# Patient Record
Sex: Female | Born: 1935
Health system: Southern US, Community
[De-identification: ages and names within clinical notes are randomized; demographics above are authoritative.]

## PROBLEM LIST (undated history)

## (undated) DIAGNOSIS — R002 Palpitations: Secondary | ICD-10-CM

## (undated) DIAGNOSIS — R42 Dizziness and giddiness: Secondary | ICD-10-CM

## (undated) DIAGNOSIS — M549 Dorsalgia, unspecified: Secondary | ICD-10-CM

## (undated) DIAGNOSIS — I10 Essential (primary) hypertension: Secondary | ICD-10-CM

## (undated) HISTORY — DX: Essential (primary) hypertension: I10

## (undated) HISTORY — PX: RETINAL LASER PROCEDURE: SHX2339

## (undated) HISTORY — DX: Palpitations: R00.2

## (undated) HISTORY — DX: Dorsalgia, unspecified: M54.9

## (undated) HISTORY — DX: Dizziness and giddiness: R42

---

## 2001-06-25 HISTORY — PX: COLONOSCOPY: SHX174

## 2006-07-23 ENCOUNTER — Emergency Department (HOSPITAL_COMMUNITY): Admission: EM | Admit: 2006-07-23 | Discharge: 2006-07-23 | Payer: Self-pay | Admitting: Emergency Medicine

## 2006-07-25 ENCOUNTER — Ambulatory Visit: Payer: Self-pay | Admitting: Cardiology

## 2006-07-26 DIAGNOSIS — I10 Essential (primary) hypertension: Secondary | ICD-10-CM | POA: Insufficient documentation

## 2006-08-06 ENCOUNTER — Encounter: Payer: Self-pay | Admitting: Cardiology

## 2006-08-06 ENCOUNTER — Ambulatory Visit: Payer: Self-pay | Admitting: Cardiology

## 2006-08-06 ENCOUNTER — Ambulatory Visit: Payer: Self-pay

## 2006-08-21 ENCOUNTER — Ambulatory Visit: Payer: Self-pay | Admitting: Internal Medicine

## 2006-08-22 ENCOUNTER — Ambulatory Visit: Payer: Self-pay | Admitting: Internal Medicine

## 2006-08-22 LAB — CONVERTED CEMR LAB
ALT: 22 units/L (ref 0–40)
Basophils Absolute: 0.3 10*3/uL — ABNORMAL HIGH (ref 0.0–0.1)
Cholesterol: 201 mg/dL (ref 0–200)
Direct LDL: 114.6 mg/dL
Eosinophils Absolute: 0.3 10*3/uL (ref 0.0–0.6)
HDL: 57.9 mg/dL (ref >39.0)
MCHC: 34.2 g/dL (ref 30.0–36.0)
MCV: 89.9 fL (ref 78.0–100.0)
Monocytes Relative: 6 % (ref 3.0–11.0)
Platelets: 212 10*3/uL (ref 150–400)
RBC: 4.71 M/uL (ref 3.87–5.11)
RDW: 12.9 % (ref 11.5–14.6)
TSH: 1.19 microintl units/mL (ref 0.35–5.50)
Total CHOL/HDL Ratio: 3.5
Triglycerides: 126 mg/dL (ref 0–149)

## 2006-08-28 ENCOUNTER — Ambulatory Visit: Payer: Self-pay | Admitting: Cardiology

## 2006-09-02 ENCOUNTER — Ambulatory Visit: Payer: Self-pay | Admitting: Internal Medicine

## 2006-09-02 LAB — CONVERTED CEMR LAB
Chloride: 107 meq/L (ref 96–112)
Creatinine, Ser: 0.6 mg/dL (ref 0.4–1.2)
GFR calc non Af Amer: 105 mL/min
Glucose, Bld: 114 mg/dL — ABNORMAL HIGH (ref 70–99)
Potassium: 3.7 meq/L (ref 3.5–5.1)
Sodium: 141 meq/L (ref 135–145)

## 2006-09-10 ENCOUNTER — Ambulatory Visit: Payer: Self-pay | Admitting: Internal Medicine

## 2007-02-25 ENCOUNTER — Ambulatory Visit: Payer: Self-pay | Admitting: Cardiology

## 2007-06-09 ENCOUNTER — Telehealth (INDEPENDENT_AMBULATORY_CARE_PROVIDER_SITE_OTHER): Payer: Self-pay | Admitting: *Deleted

## 2007-07-21 ENCOUNTER — Ambulatory Visit: Payer: Self-pay | Admitting: Cardiology

## 2007-08-11 ENCOUNTER — Ambulatory Visit: Payer: Self-pay | Admitting: Cardiology

## 2007-09-03 ENCOUNTER — Telehealth (INDEPENDENT_AMBULATORY_CARE_PROVIDER_SITE_OTHER): Payer: Self-pay | Admitting: *Deleted

## 2007-09-04 ENCOUNTER — Ambulatory Visit: Payer: Self-pay | Admitting: Cardiology

## 2007-09-16 ENCOUNTER — Ambulatory Visit: Payer: Self-pay | Admitting: Internal Medicine

## 2007-09-16 DIAGNOSIS — M549 Dorsalgia, unspecified: Secondary | ICD-10-CM | POA: Insufficient documentation

## 2007-10-14 ENCOUNTER — Ambulatory Visit: Payer: Self-pay | Admitting: Internal Medicine

## 2007-10-14 DIAGNOSIS — R002 Palpitations: Secondary | ICD-10-CM | POA: Insufficient documentation

## 2007-10-17 LAB — CONVERTED CEMR LAB
ALT: 20 units/L (ref 0–35)
CO2: 29 meq/L (ref 19–32)
Chloride: 107 meq/L (ref 96–112)
Cholesterol: 203 mg/dL (ref 0–200)
Creatinine, Ser: 0.7 mg/dL (ref 0.4–1.2)
Direct LDL: 116.3 mg/dL
GFR calc non Af Amer: 87 mL/min
Hemoglobin: 14.5 g/dL (ref 12.0–15.0)
Potassium: 4.5 meq/L (ref 3.5–5.1)
TSH: 0.91 microintl units/mL (ref 0.35–5.50)
Total CHOL/HDL Ratio: 3.3
VLDL: 23 mg/dL (ref 0–40)

## 2007-10-19 ENCOUNTER — Encounter (INDEPENDENT_AMBULATORY_CARE_PROVIDER_SITE_OTHER): Payer: Self-pay | Admitting: *Deleted

## 2007-11-25 ENCOUNTER — Ambulatory Visit: Payer: Self-pay | Admitting: Internal Medicine

## 2007-11-25 ENCOUNTER — Encounter (INDEPENDENT_AMBULATORY_CARE_PROVIDER_SITE_OTHER): Payer: Self-pay | Admitting: *Deleted

## 2008-02-13 ENCOUNTER — Ambulatory Visit: Payer: Self-pay | Admitting: Internal Medicine

## 2008-03-26 ENCOUNTER — Ambulatory Visit: Payer: Self-pay | Admitting: Internal Medicine

## 2008-03-26 DIAGNOSIS — H539 Unspecified visual disturbance: Secondary | ICD-10-CM

## 2008-06-25 HISTORY — PX: CATARACT EXTRACTION: SUR2

## 2008-08-10 ENCOUNTER — Encounter: Payer: Self-pay | Admitting: Cardiology

## 2008-08-16 ENCOUNTER — Ambulatory Visit: Payer: Self-pay | Admitting: Internal Medicine

## 2008-08-16 DIAGNOSIS — R42 Dizziness and giddiness: Secondary | ICD-10-CM

## 2008-08-23 ENCOUNTER — Telehealth (INDEPENDENT_AMBULATORY_CARE_PROVIDER_SITE_OTHER): Payer: Self-pay | Admitting: *Deleted

## 2008-08-26 ENCOUNTER — Ambulatory Visit: Payer: Self-pay | Admitting: Cardiology

## 2008-08-30 ENCOUNTER — Encounter: Payer: Self-pay | Admitting: Internal Medicine

## 2008-11-15 ENCOUNTER — Encounter (INDEPENDENT_AMBULATORY_CARE_PROVIDER_SITE_OTHER): Payer: Self-pay | Admitting: *Deleted

## 2008-11-15 ENCOUNTER — Ambulatory Visit: Payer: Self-pay | Admitting: Internal Medicine

## 2008-11-15 DIAGNOSIS — R1013 Epigastric pain: Secondary | ICD-10-CM | POA: Insufficient documentation

## 2008-11-16 ENCOUNTER — Ambulatory Visit: Payer: Self-pay | Admitting: Internal Medicine

## 2008-11-25 LAB — CONVERTED CEMR LAB
BUN: 9 mg/dL (ref 6–23)
CO2: 30 meq/L (ref 19–32)
Chloride: 113 meq/L — ABNORMAL HIGH (ref 96–112)
Glucose, Bld: 105 mg/dL — ABNORMAL HIGH (ref 70–99)
HDL: 59.6 mg/dL (ref >39.00)
LDL Cholesterol: 98 mg/dL (ref 0–99)
Potassium: 4.6 meq/L (ref 3.5–5.1)
Sodium: 144 meq/L (ref 135–145)
Total CHOL/HDL Ratio: 3
VLDL: 28.2 mg/dL (ref 0.0–40.0)
Vit D, 25-Hydroxy: 13 ng/mL — ABNORMAL LOW (ref 30–89)

## 2008-11-29 ENCOUNTER — Telehealth: Payer: Self-pay | Admitting: Internal Medicine

## 2008-11-30 ENCOUNTER — Ambulatory Visit: Payer: Self-pay | Admitting: Internal Medicine

## 2008-12-07 ENCOUNTER — Encounter (INDEPENDENT_AMBULATORY_CARE_PROVIDER_SITE_OTHER): Payer: Self-pay | Admitting: *Deleted

## 2008-12-07 LAB — CONVERTED CEMR LAB: Fecal Occult Bld: NEGATIVE

## 2008-12-13 ENCOUNTER — Encounter: Admission: RE | Admit: 2008-12-13 | Discharge: 2008-12-13 | Payer: Self-pay | Admitting: Internal Medicine

## 2008-12-31 ENCOUNTER — Telehealth: Payer: Self-pay | Admitting: Nurse Practitioner

## 2009-01-31 ENCOUNTER — Telehealth: Payer: Self-pay | Admitting: Cardiology

## 2009-03-24 ENCOUNTER — Ambulatory Visit: Payer: Self-pay | Admitting: Internal Medicine

## 2009-08-22 ENCOUNTER — Telehealth (INDEPENDENT_AMBULATORY_CARE_PROVIDER_SITE_OTHER): Payer: Self-pay | Admitting: *Deleted

## 2009-08-24 ENCOUNTER — Ambulatory Visit: Payer: Self-pay | Admitting: Internal Medicine

## 2009-08-29 ENCOUNTER — Telehealth: Payer: Self-pay | Admitting: Cardiology

## 2009-09-09 ENCOUNTER — Telehealth (INDEPENDENT_AMBULATORY_CARE_PROVIDER_SITE_OTHER): Payer: Self-pay | Admitting: *Deleted

## 2009-09-12 ENCOUNTER — Encounter (INDEPENDENT_AMBULATORY_CARE_PROVIDER_SITE_OTHER): Payer: Self-pay | Admitting: *Deleted

## 2009-09-15 ENCOUNTER — Ambulatory Visit: Payer: Self-pay | Admitting: Cardiology

## 2010-07-25 NOTE — Progress Notes (Signed)
  Phone Note Call from Patient   Caller: Patient Summary of Call: pt called has back pain, Medicaid will give me a brace if they can  get doctor approval they will fax papers.  Pt says she has been seein Dr Elmo Putt for this. Medicaid would not accept from Dr Elmo Putt office wants Dr Drue Novel to fill papers out.  Informed pt not seen since cpx in May, recommend ov for back pain,  OV scheduled .Kandice Hams  August 22, 2009 2:03 PM  Initial call taken by: Kandice Hams,  August 22, 2009 2:03 PM

## 2010-07-25 NOTE — Letter (Signed)
Summary: CMN for Back Support & Heat Therapy System/[facility]   CMN for Back Support & Heat Therapy System/[facility]   Imported By: Lanelle Bal 08/30/2009 12:16:18  _____________________________________________________________________  External Attachment:    Type:   Image     Comment:   External Document

## 2010-07-25 NOTE — Assessment & Plan Note (Signed)
Summary: back pain/alr   Vital Signs:  Patient profile:   77 year old female Height:      61.5 inches Weight:      187.6 pounds BMI:     35.00 Pulse rate:   70 / minute BP sitting:   142 / 80  Vitals Entered By: Shary Decamp (August 24, 2009 11:45 AM) CC: needs paperwork completed for a low back support   History of Present Illness: continue with chronic back pain,occ. pin gets  intense pt thinks  she will benefit from a back support and a heating pad system  currently she is not taking medication but she sees Dr. Christell Faith from time to time for an adjustment which helps  Current Medications (verified): 1)  Cartia Xt 240 Mg  Cp24 (Diltiazem Hcl Coated Beads) .Marland Kitchen.. 1po Qd 2)  Bayer Aspirin 325 Mg  Tabs (Aspirin) .Marland Kitchen.. 1 A  Day 3)  Vitamin C, D, Cal/mg, Fish Oil  Allergies (verified): No Known Drug Allergies  Past History:  Past Medical History: HYPERTENSION   PALPITATIONS, OCCASIONAL (ICD-785.1).. s/p Cards eval 07-2006, ECHO NL DIZZINESS   UNSPECIFIED VISUAL DISTURBANCE   BACK PAIN Cscope ([facility]) 2003: (-) Gyn: Dr Lloyd Huger  Past Surgical History: Reviewed history from 11/15/2008 and no changes required. laser, L retina 2009 R cataract surgery 08-2008  Social History: Reviewed history from 11/15/2008 and no changes required. widow moved to [place], family still in [place] 4 kids 8 grandkids from [place] Tobacco Use - No.  ETOH-- no exercise--active , [facility]   Physical Exam  General:  alert and well-developed.   Msk:   slightly tender to palpation in the lower back Extremities:  gait normal without distress   Impression & Recommendations:  Problem # 1:  BACK PAIN (ICD-724.5) chronic back pain, hopefully she will benefit from back support  and local heat.  Paperwork signed Her updated medication list for this problem includes:    Bayer Aspirin 325 Mg Tabs (Aspirin) .Marland Kitchen... 1 a  day  Complete Medication List: 1)  Cartia Xt 240 Mg Cp24  (Diltiazem hcl coated beads) .Marland Kitchen.. 1po qd 2)  Bayer Aspirin 325 Mg Tabs (Aspirin) .Marland Kitchen.. 1 a  day 3)  Vitamin C, D, Cal/mg, Fish Oil

## 2010-07-25 NOTE — Assessment & Plan Note (Signed)
Summary: F1Y/ GD  Medications Added BAYER ASPIRIN 325 MG  TABS (ASPIRIN) AS NEEDED      Allergies Added: ! * ASA.Marland KitchenNONCOATED  Visit Type:  1 yr f/u Primary Provider:  Nolon Rod. Paz MD  CC:  no cardiac complaints today.  History of Present Illness: Ms Vasey comes in today for followup and management of her hypertension.  Her blood pressure is running around 125 to 1:30 systolic with normal diastolics.  She's lost about 6 pounds. She looks remarkably good today.  She is having no chest pain, palpitations, shortness of breath, peripheral edema.  She needs her until her diltiazem today.  She is now living in [place]. Her son still lives here.  Current Medications (verified): 1)  Cartia Xt 240 Mg  Cp24 (Diltiazem Hcl Coated Beads) .Marland Kitchen.. 1po Qd 2)  Bayer Aspirin 325 Mg  Tabs (Aspirin) .... As Needed 3)  Vitamin D 1000 Unit Tabs (Cholecalciferol) .Marland Kitchen.. 1 Tab Once Daily 4)  Fish Oil 1000 Mg Caps (Omega-3 Fatty Acids) .Marland Kitchen.. 1 Cap Once Daily  Allergies (verified): 1)  ! * Asa..noncoated  Past History:  Past Medical History: Last updated: 08/24/2009 HYPERTENSION   PALPITATIONS, OCCASIONAL (ICD-785.1).. s/p Cards eval 07-2006, ECHO NL DIZZINESS   UNSPECIFIED VISUAL DISTURBANCE   BACK PAIN Cscope ([facility]) 2003: (-) Gyn: Dr Lloyd Huger  Past Surgical History: Last updated: 11/15/2008 laser, L retina 2009 R cataract surgery 08-2008  Family History: Last updated: 09/16/2007 DM - no MI - no breast Ca - no colon Ca - no  Social History: Last updated: 11/15/2008 widow moved to [place], family still in [place] 4 kids 8 grandkids from [place] Tobacco Use - No.  ETOH-- no exercise--active , goes to the "[facility]" daily   Risk Factors: Smoking Status: never (10/09/2008)  Review of Systems       negative other than history of present illness  Vital Signs:  Patient profile:   77 year old female Height:      61.5 inches Weight:      187 pounds BMI:     34.89 Pulse rate:    64 / minute Pulse rhythm:   irregular BP sitting:   144 / 70  (left arm) Cuff size:   large  Vitals Entered By: Danielle Rankin, CMA (September 15, 2009 2:24 PM)  Physical Exam  General:  obese.  she has lost significant weight Head:  normocephalic and atraumatic Eyes:  PERRLA/EOM intact; conjunctiva and lids normal. Neck:  Neck supple, no JVD. No masses, thyromegaly or abnormal cervical nodes. Chest Wonda Goodgame:  no deformities or breast masses noted Lungs:  Clear bilaterally to auscultation and percussion. Heart:  Non-displaced PMI, chest non-tender; regular rate and rhythm, S1, S2 without murmurs, rubs or gallops. Carotid upstroke normal, no bruit. Normal abdominal aortic size, no bruits. Femorals normal pulses, no bruits. Pedals normal pulses. No edema, no varicosities. Msk:  Back normal, normal gait. Muscle strength and tone normal. Pulses:  pulses normal in all 4 extremities Extremities:  No clubbing or cyanosis. Neurologic:  Alert and oriented x 3. Skin:  Intact without lesions or rashes. Psych:  Normal affect.   EKG  Procedure date:  09/15/2009  Findings:      sinus rhythm first degree AV block low voltage poor R-wave progression and answered recording, no change  Impression & Recommendations:  Problem # 1:  HYPERTENSION (ICD-401.9) Assessment Unchanged  Her updated medication list for this problem includes:    Cartia Xt 240 Mg Cp24 (Diltiazem hcl coated beads) .Marland KitchenMarland KitchenMarland KitchenMarland Kitchen  1po qd    Bayer Aspirin 325 Mg Tabs (Aspirin) .Marland Kitchen... As needed  Problem # 2:  PALPITATIONS, OCCASIONAL (ICD-785.1) Assessment: Improved  Her updated medication list for this problem includes:    Cartia Xt 240 Mg Cp24 (Diltiazem hcl coated beads) .Marland Kitchen... 1po qd    Bayer Aspirin 325 Mg Tabs (Aspirin) .Marland Kitchen... As needed  Orders: EKG w/ Interpretation (93000)  Patient Instructions: 1)  Your physician recommends that you schedule a follow-up appointment in: YEAR WITH DR Maxwel Meadowcroft 2)  Your physician recommends that you  continue on your current medications as directed. Please refer to the Current Medication list given to you today. Prescriptions: CARTIA XT 240 MG  CP24 (DILTIAZEM HCL COATED BEADS) 1po qd  #30 x 11   Entered by:   Scherrie Bateman, LPN   Authorized by:   Gaylord Shih, MD, [facility]   Signed by:   Scherrie Bateman, LPN on 04/54/0981   Method used:   Electronically to        [id number] (retail)       [address]       [place], [place]  [address]       Ph: [phone] or [phone]       Fax: [phone]   RxID:   [id number]

## 2010-07-25 NOTE — Miscellaneous (Signed)
Summary: med update  Clinical Lists Changes  Medications: Changed medication from * VITAMIN C, D, CAL/MG, FISH OIL to VITAMIN C 500 MG TABS (ASCORBIC ACID) 1 tab once daily Added new medication of VITAMIN D 1000 UNIT TABS (CHOLECALCIFEROL) 1 tab once daily Added new medication of FISH OIL 1000 MG CAPS (OMEGA-3 FATTY ACIDS) 1 cap once daily Added new medication of CALCIUM-MAGNESIUM 500-250 MG TABS (CALCIUM-MAGNESIUM) 1 tab once daily

## 2010-07-25 NOTE — Progress Notes (Signed)
  Phone Note Call from Patient Call back at Home Phone [phone] Call back at Work Phone [phone]   Summary of Call: Patient calling has an ulcer in her mouth x few days.  She is gargling with salt water.  Ulcer seems to be getting better.  Advised to continue gargling...watch for increased sxs or pain.  Offered ov -- pt states she is in [place].  UC if really bothersome. ov if no better in a few days Shary Decamp  September 09, 2009 9:39 AM

## 2010-07-25 NOTE — Progress Notes (Signed)
Summary: refill**New Pharmacy**   Phone Note Refill Request Message from:  Patient on August 29, 2009 10:19 AM  Refills Requested: Medication #1:  CARTIA XT 240 MG  CP24 1po qd   Supply Requested: 3 months [employer] [phone]   Method Requested: Telephone to Pharmacy Initial call taken by: Migdalia Dk,  August 29, 2009 10:22 AM  Follow-up for Phone Call        CMA s/w pharmacist and gave verbal order for rx. Danielle Rankin, CMA  August 29, 2009 2:25 PM

## 2010-10-12 ENCOUNTER — Encounter: Payer: Self-pay | Admitting: Cardiology

## 2010-10-13 ENCOUNTER — Encounter: Payer: Self-pay | Admitting: Cardiology

## 2010-10-13 ENCOUNTER — Ambulatory Visit (INDEPENDENT_AMBULATORY_CARE_PROVIDER_SITE_OTHER): Payer: Medicare Other | Admitting: Cardiology

## 2010-10-13 VITALS — BP 140/70 | HR 64 | Resp 18 | Ht 62.0 in | Wt 184.0 lb

## 2010-10-13 DIAGNOSIS — I519 Heart disease, unspecified: Secondary | ICD-10-CM | POA: Insufficient documentation

## 2010-10-13 DIAGNOSIS — I10 Essential (primary) hypertension: Secondary | ICD-10-CM

## 2010-10-13 MED ORDER — DILTIAZEM HCL ER COATED BEADS 240 MG PO CP24: 240.0000 mg | ORAL_CAPSULE | Freq: Every day | ORAL | Status: DC

## 2010-10-13 NOTE — Progress Notes (Signed)
Addended by: Lisabeth Devoid on: 10/13/2010 11:56 AM   Modules accepted: Orders

## 2010-10-13 NOTE — Progress Notes (Signed)
   Patient ID: Arrietty Dercole, female    DOB: 12-Oct-1932, 77 y.o.   MRN: [id number]  HPI  Mrs [facility]  Returns for E and M of her diastolic dysfunction and hypertension. She has been strict with her diet. Her BP has been as low as 110 routinely. No symptoms of hypoperfusion. She also has had vey little edema.  EKG shows NSR with first degree AVB.   Review of Systems  All other systems reviewed and are negative.      Physical Exam  Constitutional: She is oriented to person, place, and time. She appears well-developed and well-nourished. No distress.  HENT:  Head: Normocephalic and atraumatic.  Eyes: EOM are normal. Pupils are equal, round, and reactive to light.  Neck: Normal range of motion. Neck supple. No JVD present. No tracheal deviation present. Thyromegaly present.       No bruits.  Cardiovascular: Normal rate, regular rhythm, normal heart sounds and intact distal pulses.  Exam reveals no gallop.   No murmur heard. Pulmonary/Chest: Effort normal and breath sounds normal.  Abdominal: Soft. Bowel sounds are normal. She exhibits no distension.  Musculoskeletal: Normal range of motion.  Neurological: She is alert and oriented to person, place, and time.  Skin: Skin is warm and dry.  Psychiatric: She has a normal mood and affect.

## 2010-10-13 NOTE — Patient Instructions (Signed)
Your physician recommends that you schedule a follow-up appointment in: 1 year with Dr. Wall  

## 2010-11-07 NOTE — Assessment & Plan Note (Signed)
[facility]                            CARDIOLOGY OFFICE NOTE   VALBONA, SLABACH                    MRN:          [id number]  DATE:09/04/2007                            DOB:          Jul 07, 1932    [name] comes today for careful follow-up her for history of  hypertension and palpitations.   She cannot take Diltiazem extended release at 360 mg.  She says she just  had too much constipation.   We decreased her diltiazem to 240  mg a day and asked her to check her  blood pressure on a regular basis.  She comes in with a whole list of  blood pressures today which are 120-130 systolic and diastolic less than  80.  She feels better with less constipation.   She denies any chest pain, palpitations, shortness of breath.  She has  had no orthopnea, PND or peripheral edema.   PHYSICAL EXAMINATION:  Her blood pressure was actually high as always in  our office at 148/86, her pulse 75 and regular.  Her weight is 194. The  rest of her exam is unchanged.  LUNGS:  Clear.  HEART:  Reveals a regular rate and rhythm.  No extrasystoles.  ABDOMEN:  Soft.  There is no JVD.  EXTREMITIES:  Only trace edema.  She has chubby legs she says. Pulses  are intact.  NEUROLOGIC:  Intact.   [name] obviously has her blood pressure under good control.  We  will stay with a lower dose of 240 mg of diltiazem a day.  I will plan  on seeing her back in a year.     Thomas C. Daleen Squibb, MD, [facility]  Electronically Signed    TCW/MedQ  DD: 09/04/2007  DT: 09/05/2007  Job #: [id number]   cc:   Pollyann Glen, MD

## 2010-11-07 NOTE — Assessment & Plan Note (Signed)
[facility]                            CARDIOLOGY OFFICE NOTE   Marie Hebert, Marie Hebert                    MRN:          [id number]  DATE:02/25/2007                            DOB:          05/24/1933    [name] returns today for further management of her hypertension  and nonspecific tachycardia.   Her blood pressure has been running very well at home in the 130s on her  cuff.  She is on diltiazem 240 mg a day.  She also takes aspirin 325 mg  a day.   Previous 2 D echocardiogram showed normal left ventricular function with  no LVH.  This was obtained on August 06, 2006.   She has had some blood work obtained in February and March that I have a  copy of.  Her LDL runs only 114.6.  TSH was normal.  Total cholesterol  was 201, triglycerides 126, of HDL 57.9.  Her hemoglobin, CBC,  electrolytes, creatinine were normal.   Her only cardiac medication is diltiazem 240 mg a day.   Her blood pressure today was elevated as it always is in the office at  146/92.  We repeated it and it was 148/88.  Heart rate 74 and regular.  EKG was normal except for low voltage.  Weight is 194, down 7.  HEENT:  Unchanged.  Carotids are equal bilaterally without bruits.  There is no JVD.  Thyroid is not enlarged.  Trachea is midline.  LUNGS:  Clear.  HEART:  Nondisplaced PMI.  She has normal S1-S2.  ABDOMEN:  Soft.  EXTREMITIES:  No edema.  Pulses are intact.  NEUROLOGIC:  Intact.   ASSESSMENT AND PLAN:  [name] is doing well.  We will make no  changes in her program today.  I will plan on seeing her back in a year.     Thomas C. Daleen Squibb, MD, [facility]  Electronically Signed    TCW/MedQ  DD: 02/25/2007  DT: 02/26/2007  Job #: [id number]

## 2010-11-07 NOTE — Assessment & Plan Note (Signed)
[facility]                            CARDIOLOGY OFFICE NOTE   YANEISY, WENZ                    MRN:          [id number]  DATE:08/26/2008                            DOB:          Mar 01, 1933    Ms. Crevier comes in today for followup of her hypertension and lower  extremity edema.  She has been doing remarkably well.  Her weight is  down 2 pounds to 192.  Her blood pressures have been good outside the  office.   MEDICATIONS:  She is currently on;  1. Diltiazem 240 mg a day.  2. Aspirin 325 mg a day.  3. Fish oil.   PHYSICAL EXAMINATION:  VITAL SIGNS:  Her blood pressure today is 129/71,  her pulse is 72 and regular, and her weight is 192.  GENERAL:  She is in no acute distress.  Pleasant as always, alert and  oriented x3.  SKIN:  Warm and dry.  HEENT:  Other than wearing glasses is normal.  NECK:  Supple.  Carotid upstrokes are equal bilaterally without bruits,  no JVD.  Thyroid is not enlarged.  Trachea is midline.  CHEST:  Lungs  clear to auscultation and percussion.  HEART:  A normal S1 and S2, nondisplaced PMI.  ABDOMEN:  Soft, good bowel sounds.  No midline bruit.  No hepatomegaly.  EXTREMITIES:  No cyanosis, clubbing, or edema.  Pulses are intact in all  4 extremities.  NEURO:  Intact.  Affect is normal.   Electrocardiogram shows sinus rhythm, low voltage, no changes.   ASSESSMENT/PLAN:  Ms. Alesi is doing well.  I have made no change in  her medical program.  We will see her back in a year.      Thomas C. Daleen Squibb, MD, [facility]  Electronically Signed    TCW/MedQ  DD: 08/26/2008  DT: 08/26/2008  Job #: [id number]

## 2010-11-07 NOTE — Assessment & Plan Note (Signed)
[facility]                            CARDIOLOGY OFFICE NOTE   ALINDA, EGOLF                    MRN:          [id number]  DATE:08/11/2007                            DOB:          [birth year]    Ms. Gundlach returns today for further manage of her palpitations and  hypertension.  Blood pressure on last visit was 182/92.   We increased her diltiazem from 240 to 360.  She says she cannot take  this because of constipation.   Her blood pressures at home have been running in the 120s but she can  recall one occasion she checked it.  She was having no palpitations.   PHYSICAL EXAMINATION:  VITAL SIGNS:  Her blood pressure today was  146/82, pulse 63 and regular.  Weight is 195.  HEENT:  Unchanged.  NECK:  Carotids are full without bruits.  There is no JVD.  LUNGS:  Clear.  HEART:  A normal S1 and S2.  ABDOMEN:  Soft, good bowel sounds.  EXTREMITIES:  Trace edema.  Pulses are intact.  NEUROLOGIC:  Exam is intact.   EKG shows sinus rhythm, first-degree AV block of 216 milliseconds which  is slightly increased, low QRS voltage which is unchanged.   Medical office is convinced that she cannot take 360 mg diltiazem.  Will  cut her back to 240 and have her check blood pressures on a regular  basis.  I will have her come back in 2-3 weeks to verify this.  I have  told her there is a good chance she may need a second drug which she is  very adverse to.     Thomas C. Daleen Squibb, MD, [facility]  Electronically Signed    TCW/MedQ  DD: 08/11/2007  DT: 08/12/2007  Job #: [id number]

## 2010-11-07 NOTE — Assessment & Plan Note (Signed)
[facility]                            CARDIOLOGY OFFICE NOTE   Marie Hebert, Marie Hebert                    MRN:          [id number]  DATE:07/21/2007                            DOB:          [birth year]    [name] returns today for further management of her hypertension  and nonspecific tachycardia.  Her blood pressure has been up and down  according to her history.  She has really shied away from medications  and we have tried to treat both her history of nonspecific tachycardia  and hypertension with low dose or mid range Diltiazem.   She is has a history of normal left ventricular function with no LVH by  2-D echo August 06, 2006.   Her only cardiac medication is Diltiazem extended release 240 mg a day.   She denies orthopnea, PND or peripheral edema.  She has had no  headaches, visual changes or neurological symptoms.   Her blood pressure today by the machine is 164/83, pulse is 69 and  regular; manual is 182/92.  Her heart rate 69 and regular.  HEENT:  Normocephalic, atraumatic.  PERRLA.  Extraocular movements are  intact.  Sclera clear.  Face symmetry normal.  Carotid upstrokes are  equal bilaterally without bruits.  There is no JVD.  Thyroid is not  enlarged.  Trachea is midline.  LUNGS:  Clear.  HEART:  Reveals a nondisplaced PMI.  She has normal S1-S2 without S4.  ABDOMINAL:  Soft, good bowel sounds.  She is a moderately overweight,  difficult to assess organomegaly.  EXTREMITIES:  Reveal no edema.  Pulses are intact.  NEURO:  Exam is intact.   ASSESSMENT/PLAN:  I have explained to Marie Hebert she may need an  additional medication to control her blood pressure.  She is very  opposed to this.  She is afraid of a stroke, however.   PLAN:  1. Increase Diltiazem extended release to 360 mg a day.  2. See me back in 2 weeks to assess blood pressure as well as any      peripheral edema.  We may need an additional medication.  Goal      blood pressure is less than 140/85 or less.     Thomas C. Daleen Squibb, MD, [facility]  Electronically Signed    TCW/MedQ  DD: 07/21/2007  DT: 07/21/2007  Job #: [id number]

## 2010-11-10 NOTE — Assessment & Plan Note (Signed)
[facility]                            CARDIOLOGY OFFICE NOTE   RISE, TRAEGER                    MRN:          [id number]  DATE:07/25/2006                            DOB:          [birth year]    CARDIOLOGY CONSULTATION NOTE   CHIEF COMPLAINT:  I had a fast heartbeat.   HISTORY OF PRESENT ILLNESS:  Marie Hebert is a delightful 77 year old  widowed [place] lady who was born and raised in [place].  She is  fascinating to talk to and I thoroughly enjoyed her visit.   She was getting ready to go to bed on the evening of July 22, 2006.  She suddenly had a fast pounding in her chest that was irregular.  She  checked her pulse and it was about 120.  She came to the emergency room  by EMS.  Apparently, they placed an IV, but did not start any  medication.  By the time she got to the emergency room she was given 20  mg of IV Cardizem.  At the time she arrived at the emergency room she  was in sinus tachycardia per the ER note.  An EKG that I have looks like  sinus tac, though the P waves are a little bit different from an EKG  subsequently done on January 29.  It does not look fast enough to be  SVT, and it is fairly regular.  It may be, possibly atrial fibrillation.   She has had no recurrence since.  Her blood work in the emergency room  was unremarkable, including a potassium as well as cardiac enzymes and a  CBC.   PAST MEDICAL HISTORY:  She has no previous medical problems.  She did  have some palpitations a couple years ago in [place] and saw a  cardiologist.  Apparently, she had a nuclear stress study, which was  normal.  She takes no medications.  She has no dye reaction.  She has  difficulty with ASPIRIN upsetting her stomach.  She has taken 81 mg of  aspirin in the past.   CURRENT MEDICATIONS:  Vitamins.  Chromium.  Fish oil.  Fiber.  A herb  vitamin.   She does not smoke.  She does not drink.  She does not drink any  caffeine.  She does have a history of elevated cholesterol back to 2005.  She does not list the values.  She does walk on a regular basis.   She has had no previous surgeries.   FAMILY HISTORY:  Unremarkable.   SOCIAL HISTORY:  She is disabled from a back problem.  She is widowed  and has 4 children.  She used to use in the [place], [place] area  before she moved here.   REVIEW OF SYSTEMS:  All assessed and are negative.  She specifically  denies any orthopnea, PND, peripheral edema, chest discomfort, melena,  hematochezia.   EXAM:  Height 5 feet 2 inches, weight 202, blood pressure today 156/87,  pulse 80 and regular.  She is extremely pleasant.  She is alert and oriented x3.  SKIN:  Warm and dry.  She is wearing glasses.  HEENT:  Normocephalic, atraumatic.  PERRLA.  Extraocular movements are  intact.  Sclerae clear.  Facial symmetry was normal.  Dentition is  remarkable for a bridge in the upper left part of her mouth.  Carotids are full without bruits.  There is no JVD.  Thyroid is not  enlarged.  LUNGS:  Clear.  HEART:  Regular rate and rhythm without murmur, rub, or gallop.  ABDOMEN:  Soft with good bowel sounds.  EXTREMITIES:  Trace edema.  Pulses were brisk.  NEURO:  Intact.  MUSCULOSKELETAL:  Chronic arthritic changes.  SKIN:  Unremarkable.   ASSESSMENT AND PLAN:  I have had a long talk with Mrs. Siska.  I  think she had some sort of supraventricular tachycardia, but from the  EKGs I have it looks either like sinus tachycardia, ectopic atrial  tachycardia, or it could be even regular atrial fibrillation.   PLAN:  1. A 2D echocardiogram.  2. Begin aspirin 325 a day, coated, with food.  3. See me back on the same day of her echo to discuss the findings.      We will try to keep this treatment as conservative as possible.   I also want to check her blood pressure returns.     Thomas C. Daleen Squibb, MD, [facility]  Electronically Signed    TCW/MedQ  DD: 07/25/2006  DT:  07/25/2006  Job #: [id number]   cc:   Dr. Lahoma Crocker, MD

## 2010-11-10 NOTE — Assessment & Plan Note (Signed)
[facility]                            CARDIOLOGY OFFICE NOTE   Marie Hebert, Marie Hebert                    MRN:          [id number]  DATE:08/06/2006                            DOB:          [birth year]    The patient returns today to discuss the findings of her 2D  echocardiogram.  This was ordered because of a nonspecific tachycardia  and elevated blood pressure.  Please see my note please from July 25, 2006.   Her echo shows normal left ventricular chamber size and function.  There  was no obvious LVH.  Her EF was 50% to 55%.  Valve function was normal.  She had mild mitral valve regurgitation.  There was a very small  pericardial effusion.  There was mild left atrial dilatation.   Her blood pressure is elevated again today at 150/92 manually, 172/94  with a machine.  Her pulse is 76 and regular.  Weight is unchanged.  The  rest of the exam is unchanged.   I spent about 20 minutes talking to the patient about her hypertension  and potential strain it is putting on her heart and blood vessels.  It  may be partly responsible for the tachycardia she has had.  I have asked  her to continue aspirin 325 mg a day, and to begin diltiazem extended  release 240 mg a day.  We will plan on seeing her back in 3 weeks.  She  will check blood pressures, make recordings, and bring her machine to  calibrate here in the office at that time.     Thomas C. Daleen Squibb, MD, [facility]  Electronically Signed    TCW/MedQ  DD: 08/06/2006  DT: 08/06/2006  Job #: [id number]   cc:   Dr. Riley Nearing

## 2010-11-10 NOTE — Assessment & Plan Note (Signed)
[facility]                            CARDIOLOGY OFFICE NOTE   Marie Hebert, Marie Hebert                    MRN:          [id number]  DATE:08/28/2006                            DOB:          [birth year]    REFERRING PHYSICIAN:  Jesse Sans. Wall, MD, [facility]   The patient comes in today for further management of hypertension and  nonspecific tachycardia.  Please see my note from August 06, 2006.   Her blood pressures are running in the mid-130s at home, at rest in the  mornings.  She has had some headache from the Diltiazem, but it is  getting better.  She is on 240 mg a day.  She is also taking aspirin 325  a day.   PHYSICAL EXAMINATION:  VITAL SIGNS:  Her blood pressure in the office  continues to be in the 150s with a diastolic above 90.  This is not  consistent with her readings at home.  Her pulse is 76 and regular.  Weight is 201.  GENERAL:  She is no acute distress.  RESPIRATORY:  Exam is unchanged.   I have asked her to continue to take pressures on a regular basis.  If  she starts to run in the 140's to 150s at home, she will call us.  Otherwise, will continue with the Diltiazem 240.  Please note:  She had  no LVH on her echocardiogram.   I will see her back in 6 months.     Thomas C. Daleen Squibb, MD, [facility]  Electronically Signed    TCW/MedQ  DD: 08/28/2006  DT: 08/28/2006  Job #: [id number]   cc:   Dr. Riley Nearing

## 2011-06-28 DIAGNOSIS — IMO0002 Reserved for concepts with insufficient information to code with codable children: Secondary | ICD-10-CM | POA: Diagnosis not present

## 2011-06-28 DIAGNOSIS — M67919 Unspecified disorder of synovium and tendon, unspecified shoulder: Secondary | ICD-10-CM | POA: Diagnosis not present

## 2011-06-28 DIAGNOSIS — R269 Unspecified abnormalities of gait and mobility: Secondary | ICD-10-CM | POA: Diagnosis not present

## 2011-06-28 DIAGNOSIS — M719 Bursopathy, unspecified: Secondary | ICD-10-CM | POA: Diagnosis not present

## 2011-07-02 DIAGNOSIS — M719 Bursopathy, unspecified: Secondary | ICD-10-CM | POA: Diagnosis not present

## 2011-07-02 DIAGNOSIS — IMO0002 Reserved for concepts with insufficient information to code with codable children: Secondary | ICD-10-CM | POA: Diagnosis not present

## 2011-07-02 DIAGNOSIS — R269 Unspecified abnormalities of gait and mobility: Secondary | ICD-10-CM | POA: Diagnosis not present

## 2011-07-02 DIAGNOSIS — M67919 Unspecified disorder of synovium and tendon, unspecified shoulder: Secondary | ICD-10-CM | POA: Diagnosis not present

## 2011-07-04 DIAGNOSIS — M67919 Unspecified disorder of synovium and tendon, unspecified shoulder: Secondary | ICD-10-CM | POA: Diagnosis not present

## 2011-07-04 DIAGNOSIS — R269 Unspecified abnormalities of gait and mobility: Secondary | ICD-10-CM | POA: Diagnosis not present

## 2011-07-04 DIAGNOSIS — IMO0002 Reserved for concepts with insufficient information to code with codable children: Secondary | ICD-10-CM | POA: Diagnosis not present

## 2011-07-05 DIAGNOSIS — M719 Bursopathy, unspecified: Secondary | ICD-10-CM | POA: Diagnosis not present

## 2011-07-05 DIAGNOSIS — R269 Unspecified abnormalities of gait and mobility: Secondary | ICD-10-CM | POA: Diagnosis not present

## 2011-07-05 DIAGNOSIS — M67919 Unspecified disorder of synovium and tendon, unspecified shoulder: Secondary | ICD-10-CM | POA: Diagnosis not present

## 2011-07-05 DIAGNOSIS — IMO0002 Reserved for concepts with insufficient information to code with codable children: Secondary | ICD-10-CM | POA: Diagnosis not present

## 2011-07-11 DIAGNOSIS — IMO0002 Reserved for concepts with insufficient information to code with codable children: Secondary | ICD-10-CM | POA: Diagnosis not present

## 2011-07-11 DIAGNOSIS — M67919 Unspecified disorder of synovium and tendon, unspecified shoulder: Secondary | ICD-10-CM | POA: Diagnosis not present

## 2011-07-11 DIAGNOSIS — R269 Unspecified abnormalities of gait and mobility: Secondary | ICD-10-CM | POA: Diagnosis not present

## 2011-07-16 DIAGNOSIS — IMO0002 Reserved for concepts with insufficient information to code with codable children: Secondary | ICD-10-CM | POA: Diagnosis not present

## 2011-07-16 DIAGNOSIS — R269 Unspecified abnormalities of gait and mobility: Secondary | ICD-10-CM | POA: Diagnosis not present

## 2011-07-16 DIAGNOSIS — M67919 Unspecified disorder of synovium and tendon, unspecified shoulder: Secondary | ICD-10-CM | POA: Diagnosis not present

## 2011-07-18 DIAGNOSIS — IMO0002 Reserved for concepts with insufficient information to code with codable children: Secondary | ICD-10-CM | POA: Diagnosis not present

## 2011-07-18 DIAGNOSIS — M67919 Unspecified disorder of synovium and tendon, unspecified shoulder: Secondary | ICD-10-CM | POA: Diagnosis not present

## 2011-07-18 DIAGNOSIS — R269 Unspecified abnormalities of gait and mobility: Secondary | ICD-10-CM | POA: Diagnosis not present

## 2011-07-18 DIAGNOSIS — M719 Bursopathy, unspecified: Secondary | ICD-10-CM | POA: Diagnosis not present

## 2011-07-19 DIAGNOSIS — R269 Unspecified abnormalities of gait and mobility: Secondary | ICD-10-CM | POA: Diagnosis not present

## 2011-07-19 DIAGNOSIS — IMO0002 Reserved for concepts with insufficient information to code with codable children: Secondary | ICD-10-CM | POA: Diagnosis not present

## 2011-07-19 DIAGNOSIS — M67919 Unspecified disorder of synovium and tendon, unspecified shoulder: Secondary | ICD-10-CM | POA: Diagnosis not present

## 2011-07-23 DIAGNOSIS — R269 Unspecified abnormalities of gait and mobility: Secondary | ICD-10-CM | POA: Diagnosis not present

## 2011-07-23 DIAGNOSIS — M67919 Unspecified disorder of synovium and tendon, unspecified shoulder: Secondary | ICD-10-CM | POA: Diagnosis not present

## 2011-07-23 DIAGNOSIS — IMO0002 Reserved for concepts with insufficient information to code with codable children: Secondary | ICD-10-CM | POA: Diagnosis not present

## 2011-09-04 ENCOUNTER — Encounter: Payer: Medicare Other | Admitting: Internal Medicine

## 2011-10-04 DIAGNOSIS — H2510 Age-related nuclear cataract, unspecified eye: Secondary | ICD-10-CM | POA: Diagnosis not present

## 2011-10-09 ENCOUNTER — Telehealth: Payer: Self-pay | Admitting: Internal Medicine

## 2011-10-09 NOTE — Telephone Encounter (Signed)
Please advise 

## 2011-10-09 NOTE — Telephone Encounter (Signed)
Schedule with me at her convenience

## 2011-10-09 NOTE — Telephone Encounter (Signed)
Patient cancelled 3.18 appointment but would like to reschedule to re-establish here with [facility]. Would you agree to take her back on or should I refer her to another Provider in the practice? Please review and advise and I will call patient back to schedule Patient ph#  [phone]

## 2011-10-09 NOTE — Telephone Encounter (Signed)
Made CPE for 12/31/2011 830am, coming in tomorrow for Bladder infection advised patient she had a 15 minute visit tomorrow & a 30-minute fasting physical in July. Patient expressed full understanding.

## 2011-10-10 ENCOUNTER — Ambulatory Visit (INDEPENDENT_AMBULATORY_CARE_PROVIDER_SITE_OTHER): Payer: Medicare Other | Admitting: Internal Medicine

## 2011-10-10 ENCOUNTER — Encounter: Payer: Self-pay | Admitting: Internal Medicine

## 2011-10-10 VITALS — BP 136/78 | HR 66 | Temp 97.9°F | Wt 187.0 lb

## 2011-10-10 DIAGNOSIS — N390 Urinary tract infection, site not specified: Secondary | ICD-10-CM | POA: Diagnosis not present

## 2011-10-10 LAB — POCT URINALYSIS DIPSTICK
Glucose, UA: NEGATIVE
Spec Grav, UA: 1.01
Urobilinogen, UA: 0.2
pH, UA: 8.5

## 2011-10-10 MED ORDER — CIPROFLOXACIN HCL 250 MG PO TABS: 250.0000 mg | ORAL_TABLET | Freq: Two times a day (BID) | ORAL | Status: AC

## 2011-10-10 NOTE — Assessment & Plan Note (Signed)
Symptoms and Udip consistent with UTI. See instructions. Urine culture pending

## 2011-10-10 NOTE — Patient Instructions (Signed)
Take antibiotics as prescribed, drink plenty of fluids, call us if you are not improving in the next few days. Call any time if you get worse.

## 2011-10-10 NOTE — Progress Notes (Signed)
  Subjective:    Patient ID: Marie Hebert, female    DOB: October 13, 1932, 78 y.o.   MRN: [id number]  HPI Acute visit She was living in [place], [place], moved back a few months ago. Complaining of  Dysuria for 3 days followed by gross hematuria yesterday. She is having mild discomfort at the left flank. The discomforts on-and-off.   Past Medical History: HTN   PALPITATIONS, OCCASIONAL (ICD-785.1).. s/p Cards eval 07-2006, ECHO NL DIZZINESS   BACK PAIN Cscope ([facility]) 2003: (-) Meniscal tear 9-12 Gyn: Dr Lloyd Huger  Past Surgical History: laser, L retina 2009 R cataract surgery 08-2008  Social History: Widow, moved back to [place] from [place] 06-2011 4 kids 8 grandkids from [place] Tobacco Use - No.  ETOH-- no   Review of Systems No fever or chills No nausea, vomiting, diarrhea.     Objective:   Physical Exam  General -- alert, well-developed, and well-nourished. NAD  Lungs -- normal respiratory effort, no intercostal retractions, no accessory muscle use, and normal breath sounds.   Heart-- normal rate, regular rhythm, no murmur, and no gallop.   Abdomen--soft, non-tender, no distention, no masses, no HSM, no guarding, and no rigidity.  No CVA tenderness  Extremities-- no pretibial edema bilaterally      Assessment & Plan:

## 2011-10-12 LAB — URINE CULTURE: Colony Count: >=100000

## 2011-10-24 ENCOUNTER — Ambulatory Visit: Payer: Medicare Other | Admitting: Cardiology

## 2011-12-03 DIAGNOSIS — M999 Biomechanical lesion, unspecified: Secondary | ICD-10-CM | POA: Diagnosis not present

## 2011-12-03 DIAGNOSIS — M5137 Other intervertebral disc degeneration, lumbosacral region: Secondary | ICD-10-CM | POA: Diagnosis not present

## 2011-12-17 DIAGNOSIS — M5137 Other intervertebral disc degeneration, lumbosacral region: Secondary | ICD-10-CM | POA: Diagnosis not present

## 2011-12-17 DIAGNOSIS — M999 Biomechanical lesion, unspecified: Secondary | ICD-10-CM | POA: Diagnosis not present

## 2011-12-31 ENCOUNTER — Ambulatory Visit (INDEPENDENT_AMBULATORY_CARE_PROVIDER_SITE_OTHER): Payer: Medicare Other | Admitting: Internal Medicine

## 2011-12-31 ENCOUNTER — Encounter: Payer: Self-pay | Admitting: Internal Medicine

## 2011-12-31 VITALS — BP 144/82 | HR 65 | Temp 98.0°F | Ht 62.0 in | Wt 184.0 lb

## 2011-12-31 DIAGNOSIS — E559 Vitamin D deficiency, unspecified: Secondary | ICD-10-CM | POA: Diagnosis not present

## 2011-12-31 DIAGNOSIS — M1990 Unspecified osteoarthritis, unspecified site: Secondary | ICD-10-CM | POA: Diagnosis not present

## 2011-12-31 DIAGNOSIS — Z0000 Encounter for general adult medical examination without abnormal findings: Secondary | ICD-10-CM | POA: Diagnosis not present

## 2011-12-31 DIAGNOSIS — I10 Essential (primary) hypertension: Secondary | ICD-10-CM

## 2011-12-31 LAB — CBC WITH DIFFERENTIAL/PLATELET
Basophils Relative: 1.7 % (ref 0.0–3.0)
Eosinophils Relative: 4.9 % (ref 0.0–5.0)
HCT: 43.5 % (ref 36.0–46.0)
Lymphs Abs: 2 10*3/uL (ref 0.7–4.0)
MCHC: 33.5 g/dL (ref 30.0–36.0)
MCV: 91.1 fl (ref 78.0–100.0)
Monocytes Absolute: 0.4 10*3/uL (ref 0.1–1.0)
Platelets: 191 10*3/uL (ref 150.0–400.0)
WBC: 5.5 10*3/uL (ref 4.5–10.5)

## 2011-12-31 LAB — BASIC METABOLIC PANEL
BUN: 13 mg/dL (ref 6–23)
CO2: 22 mEq/L (ref 19–32)
Chloride: 109 mEq/L (ref 96–112)
Potassium: 3.9 mEq/L (ref 3.5–5.1)

## 2011-12-31 LAB — LDL CHOLESTEROL, DIRECT: Direct LDL: 115.1 mg/dL

## 2011-12-31 LAB — LIPID PANEL: Total CHOL/HDL Ratio: 3

## 2011-12-31 MED ORDER — DILTIAZEM HCL ER COATED BEADS 240 MG PO CP24: 240.0000 mg | ORAL_CAPSULE | Freq: Every day | ORAL | Status: DC

## 2011-12-31 NOTE — Progress Notes (Signed)
  Subjective:    Patient ID: Marie Hebert, female    DOB: 31-May-1933, 78 y.o.   MRN: [id number]  HPI Here for Medicare AWV: 1. Risk factors based on Past M, S, F history: reviewed 2. Physical Activities:  Limited by L>R knee pain, L knee worse since a fall last year 3. Depression/mood:  No problemss noted or reported  4. Hearing:  No problemss noted or reported  5. ADL's:  Independent, still drives  6. Fall Risk: fell last year, injured knee , prevention discussed  7. home Safety: does feelsafe at home  8. Height, weight, &visual acuity: see VS, uses reading glasses  9. Counseling: provided 10. Labs ordered based on risk factors: if needed  11. Referral Coordination: if needed 12.  Care Plan, see assessment and plan  13.   Cognitive Assessment: Cognition within normal. Motor skills limited by knee pain  In addition, today we discussed the following: History of palpitations, currently asymptomatic Hypertension, good medication compliance with Cardizem. Ambulatory BPs checked frequently and they're normal. History of vitamin D deficiency, on daily calcium and vitamin D.   Past Medical History: HTN    Palpitations.. s/p Cards eval 07-2006, ECHO NL Vitamin D deficiency   Back pain on-off DJD Meniscal tear 9-12   Past Surgical History: laser, L retina 2009 R cataract surgery 08-2008  Social History: Widow, moved back to [place] from [place] 06-2011, lives by herself 4 kids, 8 grandkids from [place] Tobacco Use - No.   ETOH-- no Diet-- healthy  Family History: DM - no MI - no breast Ca - no colon Ca - no  Review of Systems No chest pain or shortness of breath No nausea, vomiting, diarrhea. No blood in the stools. No dysuria or gross hematuria. No vaginal discharge or bleeding.    Objective:   Physical Exam  General -- alert, well-developed  Neck --no thyromegaly , normal carotid pulse, no LADs Lungs -- normal respiratory effort, no intercostal retractions, no  accessory muscle use, and normal breath sounds.   Heart-- normal rate, regular rhythm, no murmur, and no gallop.   Abdomen--soft, non-tender, no distention, no masses, no HSM, no guarding, and no rigidity. No bruit  Extremities-- no pretibial edema bilaterally Neurologic-- alert & oriented X3 and strength normal in all extremities. Psych-- Cognition and judgment appear intact. Alert and cooperative with normal attention span and concentration.  not anxious appearing and not depressed appearing.      Assessment & Plan:

## 2011-12-31 NOTE — Patient Instructions (Addendum)
Check the  blood pressure 2 or 3 times a week, be sure it is between 110/60 and 140/85. If it is consistently higher or lower, let me know  

## 2011-12-31 NOTE — Assessment & Plan Note (Addendum)
Bilateral knee pain, L>R; left knee pain exacerbated by a fall last year, saw a specialist at [facility], was told she had a meniscal problem. Declined  further evaluation---> will call if interested

## 2011-12-31 NOTE — Assessment & Plan Note (Signed)
Well-controlled, continue with Cardizem

## 2011-12-31 NOTE — Assessment & Plan Note (Signed)
Low vitamin D in 2010, status post replacement. . She does take reportedly, calcium and vitamin D daily Plan-- labs

## 2011-12-31 NOTE — Assessment & Plan Note (Signed)
Td 2009 pneumonia shot-- declined  shingles shot-- benefits discussed, declined   last PAP @ Dr Lloyd Huger, patient states it was very difficult (vag atresia?) , no h/o previous abnormal PAPs-- declines further screening  Normal MMG 2010, no FH, declines further evaluation DEXA-- had one in the 2000s,   explained benefits of get one again; declined  reportedly Cscope 2003 (-),  neg hemocults 2009, (-) iFOB 2010. Declined further screening

## 2012-01-01 LAB — VITAMIN D 25 HYDROXY (VIT D DEFICIENCY, FRACTURES): Vit D, 25-Hydroxy: 34 ng/mL (ref 30–89)

## 2012-01-02 ENCOUNTER — Encounter: Payer: Self-pay | Admitting: *Deleted

## 2012-04-01 ENCOUNTER — Encounter: Payer: Self-pay | Admitting: Cardiology

## 2012-04-01 ENCOUNTER — Ambulatory Visit (INDEPENDENT_AMBULATORY_CARE_PROVIDER_SITE_OTHER): Payer: Medicare Other | Admitting: Cardiology

## 2012-04-01 VITALS — BP 145/73 | HR 79 | Ht 61.5 in | Wt 185.0 lb

## 2012-04-01 DIAGNOSIS — I10 Essential (primary) hypertension: Secondary | ICD-10-CM

## 2012-04-01 DIAGNOSIS — R002 Palpitations: Secondary | ICD-10-CM | POA: Diagnosis not present

## 2012-04-01 NOTE — Progress Notes (Signed)
HPI Marie Hebert returns today for evaluation and management of her hypertension, chronic diastolic dysfunction, and history of palpitations.  She continues to do remarkably well with minimal therapy with diltiazem. Her blood pressures are using running 125-13 5/75-80. She's very good with her diet eating mostly vegetables. She's very careful salt. She denies orthopnea, PND or edema. She has no palpitations. She fell down some steps last year and twisted her left knee but did not have any cardiac symptoms prior to that.  Past Medical History  Diagnosis Date  . Hypertension   . Palpitations   . Dizziness   . Visual disturbance   . Back pain     Current Outpatient Prescriptions  Medication Sig Dispense Refill  . diltiazem (CARDIZEM CD) 240 MG 24 hr capsule Take 1 capsule (240 mg total) by mouth daily.  90 capsule  3  . Cholecalciferol (VITAMIN D) 1000 UNITS capsule Take by mouth 2 (two) times daily.       . Magnesium 500 MG CAPS Take 1 capsule by mouth daily.        . Omega-3 Fatty Acids (FISH OIL) 1000 MG CAPS Take 1 capsule by mouth daily.          Allergies  Allergen Reactions  . Aspirin     Family History  Problem Relation Age of Onset  . Other      no family hx of heart disease    History   Social History  . Marital Status: Widowed    Spouse Name: N/A    Number of Children: 4  . Years of Education: N/A   Occupational History  . Not on file.   Social History Main Topics  . Smoking status: Never Smoker   . Smokeless tobacco: Not on file  . Alcohol Use: No  . Drug Use: No  . Sexually Active: Not on file   Other Topics Concern  . Not on file   Social History Narrative  . No narrative on file    ROS ALL NEGATIVE EXCEPT THOSE NOTED IN HPI  PE  General Appearance: well developed, well nourished in no acute distress, overweight HEENT: symmetrical face, PERRLA, good dentition  Neck: no JVD, thyromegaly, or adenopathy, trachea midline Chest: symmetric without  deformity Cardiac: PMI non-displaced, RRR, normal S1, S2, no gallop or murmur Lung: clear to ausculation and percussion Vascular: all pulses full without bruits  Abdominal: nondistended, nontender, good bowel sounds, no HSM, no bruits Extremities: no cyanosis, clubbing or edema, no sign of DVT, no varicosities  Skin: normal color, no rashes Neuro: alert and oriented x 3, non-focal Pysch: normal affect  EKG Normal sinus rhythm with PACs, otherwise normal EKG BMET    Component Value Date/Time   NA 141 12/31/2011 0905   K 3.9 12/31/2011 0905   CL 109 12/31/2011 0905   CO2 22 12/31/2011 0905   GLUCOSE 102* 12/31/2011 0905   BUN 13 12/31/2011 0905   CREATININE 0.6 12/31/2011 0905   CALCIUM 8.8 12/31/2011 0905   GFRNONAA 86.97 11/16/2008 0925   GFRAA 106 10/14/2007 1105    Lipid Panel     Component Value Date/Time   CHOL 217* 12/31/2011 0905   TRIG 97.0 12/31/2011 0905   HDL 79.70 12/31/2011 0905   CHOLHDL 3 12/31/2011 0905   VLDL 19.4 12/31/2011 0905   LDLCALC 98 11/16/2008 0925    CBC    Component Value Date/Time   WBC 5.5 12/31/2011 0905   RBC 4.77 12/31/2011 0905   HGB  14.6 12/31/2011 0905   HCT 43.5 12/31/2011 0905   PLT 191.0 12/31/2011 0905   MCV 91.1 12/31/2011 0905   MCHC 33.5 12/31/2011 0905   RDW 14.4 12/31/2011 0905   LYMPHSABS 2.0 12/31/2011 0905   MONOABS 0.4 12/31/2011 0905   EOSABS 0.3 12/31/2011 0905   BASOSABS 0.1 12/31/2011 0905

## 2012-04-01 NOTE — Patient Instructions (Addendum)
Your physician recommends that you continue on your current medications as directed. Please refer to the Current Medication list given to you today.  Your physician wants you to follow-up in: 1 year. You will receive a reminder letter in the mail two months in advance. If you don't receive a letter, please call our office to schedule the follow-up appointment.  

## 2012-04-01 NOTE — Assessment & Plan Note (Signed)
I rechecked in the office it was still 152/85. She promises me that is under good control at home. I've made no changes in her medical program. Her diltiazem also seems to be controlling her palpitations. Return in one year or when necessary.

## 2012-04-02 ENCOUNTER — Ambulatory Visit (INDEPENDENT_AMBULATORY_CARE_PROVIDER_SITE_OTHER): Payer: Medicare Other | Admitting: Internal Medicine

## 2012-04-02 ENCOUNTER — Encounter: Payer: Self-pay | Admitting: Internal Medicine

## 2012-04-02 VITALS — BP 132/78 | HR 72 | Wt 185.0 lb

## 2012-04-02 DIAGNOSIS — R1013 Epigastric pain: Secondary | ICD-10-CM | POA: Diagnosis not present

## 2012-04-02 MED ORDER — OMEPRAZOLE 40 MG PO CPDR: 40.0000 mg | DELAYED_RELEASE_CAPSULE | Freq: Every day | ORAL | Status: DC

## 2012-04-02 NOTE — Progress Notes (Signed)
  Subjective:    Patient ID: Marie Hebert, female    DOB: 1932-10-09, 78 y.o.   MRN: [id number]  HPI Acute visit One month history of on and off epigastric pain described as fire, symptoms more steady for the last 5 days. Pain does not radiate to the back, it is worse after eating. She also has noted some increasing burping. Did get milk of magnesia last night and it helped.   Past Medical History: HTN    Palpitations.. s/p Cards eval 07-2006, ECHO NL Vitamin D deficiency   Back pain on-off DJD Meniscal tear 9-12   Past Surgical History: laser, L retina 2009 R cataract surgery 08-2008  Social History: Widow, moved back to [place] from [place] 06-2011, lives by herself 4 kids, 8 grandkids from [place] Tobacco Use - No.   ETOH-- no Diet-- healthy  Family History: DM - no MI - no breast Ca - no colon Ca - no   Review of Systems No fever chills, no weight loss No nausea, vomiting, diarrhea. No change in the color of his stools, no blood in the stools. Denies taking Motrin or any NSAID.     Objective:   Physical Exam  General -- alert, well-developed, and overweight appearing. No apparent distress.  HEENT -- not pale Lungs -- normal respiratory effort, no intercostal retractions, no accessory muscle use, and normal breath sounds.   Heart-- normal rate, regular rhythm, no murmur, and no gallop.   Abdomen-- nondistended, good bowel sounds, slightly tender at the epigastric area without mass or rebound. No  right upper quadrant tenderness. Extremities-- no pretibial edema bilaterally  psych-- Cognition and judgment appear intact. Alert and cooperative with normal attention span and concentration.  not anxious appearing and not depressed appearing.        Assessment & Plan:  Declined a flu shot

## 2012-04-02 NOTE — Patient Instructions (Addendum)
Take omeprazole 40 mg one tablet every morning 30 minutes before breakfast for 6 weeks even if you start to feel better. Call if you are not feeling better, your symptoms come back or if you see any changes in the color of the stools or blood in the stools.

## 2012-04-02 NOTE — Assessment & Plan Note (Addendum)
One-month history of on and off epigastric pain described as fire. Similar symptoms in 2010--->  sx self resolved. Review of system is negative for red flag symptoms Plan: Rx PPIs for 6 weeks, see instructions.

## 2012-04-21 DIAGNOSIS — S335XXA Sprain of ligaments of lumbar spine, initial encounter: Secondary | ICD-10-CM | POA: Diagnosis not present

## 2012-04-21 DIAGNOSIS — M9981 Other biomechanical lesions of cervical region: Secondary | ICD-10-CM | POA: Diagnosis not present

## 2012-04-21 DIAGNOSIS — M999 Biomechanical lesion, unspecified: Secondary | ICD-10-CM | POA: Diagnosis not present

## 2012-04-21 DIAGNOSIS — S139XXA Sprain of joints and ligaments of unspecified parts of neck, initial encounter: Secondary | ICD-10-CM | POA: Diagnosis not present

## 2012-04-21 DIAGNOSIS — S239XXA Sprain of unspecified parts of thorax, initial encounter: Secondary | ICD-10-CM | POA: Diagnosis not present

## 2012-04-22 DIAGNOSIS — H2510 Age-related nuclear cataract, unspecified eye: Secondary | ICD-10-CM | POA: Diagnosis not present

## 2012-05-26 DIAGNOSIS — S139XXA Sprain of joints and ligaments of unspecified parts of neck, initial encounter: Secondary | ICD-10-CM | POA: Diagnosis not present

## 2012-05-26 DIAGNOSIS — M999 Biomechanical lesion, unspecified: Secondary | ICD-10-CM | POA: Diagnosis not present

## 2012-05-26 DIAGNOSIS — S335XXA Sprain of ligaments of lumbar spine, initial encounter: Secondary | ICD-10-CM | POA: Diagnosis not present

## 2012-05-26 DIAGNOSIS — S239XXA Sprain of unspecified parts of thorax, initial encounter: Secondary | ICD-10-CM | POA: Diagnosis not present

## 2012-05-26 DIAGNOSIS — M9981 Other biomechanical lesions of cervical region: Secondary | ICD-10-CM | POA: Diagnosis not present

## 2012-09-03 DIAGNOSIS — M999 Biomechanical lesion, unspecified: Secondary | ICD-10-CM | POA: Diagnosis not present

## 2012-09-03 DIAGNOSIS — S239XXA Sprain of unspecified parts of thorax, initial encounter: Secondary | ICD-10-CM | POA: Diagnosis not present

## 2012-09-03 DIAGNOSIS — S335XXA Sprain of ligaments of lumbar spine, initial encounter: Secondary | ICD-10-CM | POA: Diagnosis not present

## 2012-09-03 DIAGNOSIS — M9981 Other biomechanical lesions of cervical region: Secondary | ICD-10-CM | POA: Diagnosis not present

## 2012-09-03 DIAGNOSIS — S139XXA Sprain of joints and ligaments of unspecified parts of neck, initial encounter: Secondary | ICD-10-CM | POA: Diagnosis not present

## 2012-09-04 DIAGNOSIS — M9981 Other biomechanical lesions of cervical region: Secondary | ICD-10-CM | POA: Diagnosis not present

## 2012-09-04 DIAGNOSIS — M999 Biomechanical lesion, unspecified: Secondary | ICD-10-CM | POA: Diagnosis not present

## 2012-09-04 DIAGNOSIS — S335XXA Sprain of ligaments of lumbar spine, initial encounter: Secondary | ICD-10-CM | POA: Diagnosis not present

## 2012-09-04 DIAGNOSIS — S139XXA Sprain of joints and ligaments of unspecified parts of neck, initial encounter: Secondary | ICD-10-CM | POA: Diagnosis not present

## 2012-09-04 DIAGNOSIS — S239XXA Sprain of unspecified parts of thorax, initial encounter: Secondary | ICD-10-CM | POA: Diagnosis not present

## 2012-09-10 DIAGNOSIS — S239XXA Sprain of unspecified parts of thorax, initial encounter: Secondary | ICD-10-CM | POA: Diagnosis not present

## 2012-09-10 DIAGNOSIS — M9981 Other biomechanical lesions of cervical region: Secondary | ICD-10-CM | POA: Diagnosis not present

## 2012-09-10 DIAGNOSIS — S335XXA Sprain of ligaments of lumbar spine, initial encounter: Secondary | ICD-10-CM | POA: Diagnosis not present

## 2012-09-10 DIAGNOSIS — M999 Biomechanical lesion, unspecified: Secondary | ICD-10-CM | POA: Diagnosis not present

## 2012-09-10 DIAGNOSIS — S139XXA Sprain of joints and ligaments of unspecified parts of neck, initial encounter: Secondary | ICD-10-CM | POA: Diagnosis not present

## 2012-09-15 DIAGNOSIS — M999 Biomechanical lesion, unspecified: Secondary | ICD-10-CM | POA: Diagnosis not present

## 2012-09-15 DIAGNOSIS — S239XXA Sprain of unspecified parts of thorax, initial encounter: Secondary | ICD-10-CM | POA: Diagnosis not present

## 2012-09-15 DIAGNOSIS — M9981 Other biomechanical lesions of cervical region: Secondary | ICD-10-CM | POA: Diagnosis not present

## 2012-09-15 DIAGNOSIS — S139XXA Sprain of joints and ligaments of unspecified parts of neck, initial encounter: Secondary | ICD-10-CM | POA: Diagnosis not present

## 2012-09-15 DIAGNOSIS — S335XXA Sprain of ligaments of lumbar spine, initial encounter: Secondary | ICD-10-CM | POA: Diagnosis not present

## 2012-09-17 DIAGNOSIS — M9981 Other biomechanical lesions of cervical region: Secondary | ICD-10-CM | POA: Diagnosis not present

## 2012-09-17 DIAGNOSIS — S139XXA Sprain of joints and ligaments of unspecified parts of neck, initial encounter: Secondary | ICD-10-CM | POA: Diagnosis not present

## 2012-09-17 DIAGNOSIS — S239XXA Sprain of unspecified parts of thorax, initial encounter: Secondary | ICD-10-CM | POA: Diagnosis not present

## 2012-09-17 DIAGNOSIS — S335XXA Sprain of ligaments of lumbar spine, initial encounter: Secondary | ICD-10-CM | POA: Diagnosis not present

## 2012-09-17 DIAGNOSIS — M999 Biomechanical lesion, unspecified: Secondary | ICD-10-CM | POA: Diagnosis not present

## 2012-09-24 DIAGNOSIS — S239XXA Sprain of unspecified parts of thorax, initial encounter: Secondary | ICD-10-CM | POA: Diagnosis not present

## 2012-09-24 DIAGNOSIS — M9981 Other biomechanical lesions of cervical region: Secondary | ICD-10-CM | POA: Diagnosis not present

## 2012-09-24 DIAGNOSIS — S335XXA Sprain of ligaments of lumbar spine, initial encounter: Secondary | ICD-10-CM | POA: Diagnosis not present

## 2012-09-24 DIAGNOSIS — M999 Biomechanical lesion, unspecified: Secondary | ICD-10-CM | POA: Diagnosis not present

## 2012-09-24 DIAGNOSIS — S139XXA Sprain of joints and ligaments of unspecified parts of neck, initial encounter: Secondary | ICD-10-CM | POA: Diagnosis not present

## 2012-10-01 DIAGNOSIS — S335XXA Sprain of ligaments of lumbar spine, initial encounter: Secondary | ICD-10-CM | POA: Diagnosis not present

## 2012-10-01 DIAGNOSIS — S239XXA Sprain of unspecified parts of thorax, initial encounter: Secondary | ICD-10-CM | POA: Diagnosis not present

## 2012-10-01 DIAGNOSIS — S139XXA Sprain of joints and ligaments of unspecified parts of neck, initial encounter: Secondary | ICD-10-CM | POA: Diagnosis not present

## 2012-10-01 DIAGNOSIS — M9981 Other biomechanical lesions of cervical region: Secondary | ICD-10-CM | POA: Diagnosis not present

## 2012-10-01 DIAGNOSIS — M999 Biomechanical lesion, unspecified: Secondary | ICD-10-CM | POA: Diagnosis not present

## 2012-11-27 ENCOUNTER — Encounter: Payer: Self-pay | Admitting: Cardiology

## 2012-11-27 ENCOUNTER — Ambulatory Visit (INDEPENDENT_AMBULATORY_CARE_PROVIDER_SITE_OTHER): Payer: Medicaid Other | Admitting: Cardiology

## 2012-11-27 VITALS — BP 118/72 | HR 70 | Ht 61.5 in | Wt 183.0 lb

## 2012-11-27 DIAGNOSIS — R002 Palpitations: Secondary | ICD-10-CM | POA: Diagnosis not present

## 2012-11-27 DIAGNOSIS — I10 Essential (primary) hypertension: Secondary | ICD-10-CM

## 2012-11-27 NOTE — Progress Notes (Signed)
HPI Marie Hebert returns today for the evaluation of her history of occasional palpitations and hypertension. She is unremarkably well with no complaints. She is followed in primary care with Dr.Paz.  Past Medical History  Diagnosis Date  . Hypertension   . Palpitations   . Dizziness   . Visual disturbance   . Back pain     Current Outpatient Prescriptions  Medication Sig Dispense Refill  . Cholecalciferol (VITAMIN D) 1000 UNITS capsule Take by mouth 2 (two) times daily.       Marland Kitchen diltiazem (CARDIZEM CD) 240 MG 24 hr capsule Take 1 capsule (240 mg total) by mouth daily.  90 capsule  3  . Magnesium 500 MG CAPS Take 1 capsule by mouth daily.        . Omega-3 Fatty Acids (FISH OIL) 1000 MG CAPS Take 1 capsule by mouth daily.        Marland Kitchen omeprazole (PRILOSEC) 40 MG capsule Take 1 capsule (40 mg total) by mouth daily.  30 capsule  1   No current facility-administered medications for this visit.    Allergies  Allergen Reactions  . Aspirin     Family History  Problem Relation Age of Onset  . Other      no family hx of heart disease    History   Social History  . Marital Status: Widowed    Spouse Name: N/A    Number of Children: 4  . Years of Education: N/A   Occupational History  . Not on file.   Social History Main Topics  . Smoking status: Never Smoker   . Smokeless tobacco: Not on file  . Alcohol Use: No  . Drug Use: No  . Sexually Active: Not on file   Other Topics Concern  . Not on file   Social History Narrative  . No narrative on file    ROS ALL NEGATIVE EXCEPT THOSE NOTED IN HPI  PE  General Appearance: well developed, well nourished in no acute distress, overweight HEENT: symmetrical face, PERRLA, good dentition  Neck: no JVD, thyromegaly, or adenopathy, trachea midline Chest: symmetric without deformity Cardiac: PMI non-displaced, RRR, normal S1, S2, no gallop or murmur Lung: clear to ausculation and percussion Vascular: all pulses full without bruits   Abdominal: nondistended, nontender, good bowel sounds, no HSM, no bruits Extremities: no cyanosis, clubbing or edema, no sign of DVT, no varicosities  Skin: normal color, no rashes Neuro: alert and oriented x 3, non-focal Pysch: normal affect  EKG  BMET    Component Value Date/Time   NA 141 12/31/2011 0905   K 3.9 12/31/2011 0905   CL 109 12/31/2011 0905   CO2 22 12/31/2011 0905   GLUCOSE 102* 12/31/2011 0905   BUN 13 12/31/2011 0905   CREATININE 0.6 12/31/2011 0905   CALCIUM 8.8 12/31/2011 0905   GFRNONAA 86.97 11/16/2008 0925   GFRAA 106 10/14/2007 1105    Lipid Panel     Component Value Date/Time   CHOL 217* 12/31/2011 0905   TRIG 97.0 12/31/2011 0905   HDL 79.70 12/31/2011 0905   CHOLHDL 3 12/31/2011 0905   VLDL 19.4 12/31/2011 0905   LDLCALC 98 11/16/2008 0925    CBC    Component Value Date/Time   WBC 5.5 12/31/2011 0905   RBC 4.77 12/31/2011 0905   HGB 14.6 12/31/2011 0905   HCT 43.5 12/31/2011 0905   PLT 191.0 12/31/2011 0905   MCV 91.1 12/31/2011 0905   MCHC 33.5 12/31/2011 0905   RDW  14.4 12/31/2011 0905   LYMPHSABS 2.0 12/31/2011 0905   MONOABS 0.4 12/31/2011 0905   EOSABS 0.3 12/31/2011 0905   BASOSABS 0.1 12/31/2011 0905

## 2012-11-27 NOTE — Assessment & Plan Note (Signed)
Under good control. Continue current medication. Follow with Dr.Paz.

## 2012-11-27 NOTE — Patient Instructions (Addendum)
Your physician recommends that you follow-up as needed with our office.  We will be happy to see you.  Your physician recommends that you continue on your current medications as directed. Please refer to the Current Medication list given to you today.

## 2012-12-17 ENCOUNTER — Other Ambulatory Visit: Payer: Self-pay | Admitting: Internal Medicine

## 2012-12-17 NOTE — Telephone Encounter (Signed)
Refill done.  

## 2013-01-01 ENCOUNTER — Ambulatory Visit: Payer: Medicare Other | Admitting: Internal Medicine

## 2013-01-02 ENCOUNTER — Ambulatory Visit (INDEPENDENT_AMBULATORY_CARE_PROVIDER_SITE_OTHER): Payer: Medicare Other | Admitting: Internal Medicine

## 2013-01-02 VITALS — BP 120/65 | HR 73 | Temp 97.7°F | Wt 184.8 lb

## 2013-01-02 DIAGNOSIS — R350 Frequency of micturition: Secondary | ICD-10-CM | POA: Diagnosis not present

## 2013-01-02 DIAGNOSIS — N390 Urinary tract infection, site not specified: Secondary | ICD-10-CM

## 2013-01-02 DIAGNOSIS — R1013 Epigastric pain: Secondary | ICD-10-CM

## 2013-01-02 LAB — POCT URINALYSIS DIPSTICK
Nitrite, UA: NEGATIVE
Protein, UA: NEGATIVE
Urobilinogen, UA: 0.2
pH, UA: 7

## 2013-01-02 MED ORDER — CIPROFLOXACIN HCL 500 MG PO TABS: 500.0000 mg | ORAL_TABLET | Freq: Two times a day (BID) | ORAL | Status: DC

## 2013-01-02 NOTE — Progress Notes (Signed)
  Subjective:    Patient ID: Marie Hebert, female    DOB: 06/18/1933, 79 y.o.   MRN: [id number]  HPI Acute visit Symptoms started 5 days ago, urinary burning and frequency. She took cranberry juice, got a little better but symptoms resurface yesterday although they are not as severe as before.    Past Medical History: HTN    Palpitations.. s/p Cards eval 07-2006, ECHO NL Vitamin D deficiency   Back pain on-off DJD Meniscal tear 9-12   Past Surgical History: laser, L retina 2009 R cataract surgery 08-2008  Social History: Widow, moved back to [place] from [place] 06-2011, lives by herself 4 kids, 8 grandkids from [place] Tobacco Use - No.   ETOH-- no Diet-- healthy  Family History: DM - no MI - no breast Ca - no colon Ca - no  Review of Systems No fever or chills No flank pain No nausea or vomiting    Objective:   Physical Exam BP 120/65  Pulse 73  Temp(Src) 97.7 F (36.5 C) (Oral)  Wt 184 lb 12.8 oz (83.825 kg)  BMI 34.36 kg/m2  SpO2 97%  General -- alert, well-developed,NAD   Abdomen--soft, non-tender, no distention, no masses, no CVA tenderness  Extremities-- no pretibial edema bilaterally  Neurologic-- alert & oriented X3 and strength normal in all extremities. Psych-- Cognition and judgment appear intact. Alert and cooperative with normal attention span and concentration.  not anxious appearing and not depressed appearing.      Assessment & Plan:  UTI, Symptoms and Udip consistent with cystitis,See instructions

## 2013-01-02 NOTE — Patient Instructions (Addendum)
Drink plenty of fluids Take the antibiotic as prescribed Call if not  back to normal in few days Schedule a yearly checkup at your convenience

## 2013-01-02 NOTE — Assessment & Plan Note (Signed)
Symptoms completely resolve

## 2013-01-04 ENCOUNTER — Encounter: Payer: Self-pay | Admitting: Internal Medicine

## 2013-01-06 ENCOUNTER — Telehealth: Payer: Self-pay | Admitting: *Deleted

## 2013-01-06 DIAGNOSIS — H2510 Age-related nuclear cataract, unspecified eye: Secondary | ICD-10-CM | POA: Diagnosis not present

## 2013-01-06 NOTE — Telephone Encounter (Signed)
Spoke with pt who states she is feeling much better. Advised to call the office with any questions/concerns.

## 2013-01-06 NOTE — Progress Notes (Signed)
Spoke with pt, she states she is feeling much better. Advised to call with any further concerns.

## 2013-01-26 ENCOUNTER — Telehealth: Payer: Self-pay | Admitting: *Deleted

## 2013-01-26 MED ORDER — SULFAMETHOXAZOLE-TMP DS 800-160 MG PO TABS: 1.0000 | ORAL_TABLET | Freq: Two times a day (BID) | ORAL | Status: DC

## 2013-01-26 NOTE — Telephone Encounter (Signed)
Pt. Contacted office to state that she has completed the course of antibiotics Dr. Drue Novel prescribed her for her UTI however symptoms have now returned. She is currently out of town in [place], [place] and would like a [facility] [place]. The # to the pharmacy is [phone].

## 2013-01-26 NOTE — Telephone Encounter (Signed)
Discussed with patient, clarified correct pharmacy phone #[phone]. Verbalized understanding. [facility] [place] Spoke with Pharmacist Onalee Hua who performed verbal read back of rx for verification.

## 2013-01-26 NOTE — Telephone Encounter (Signed)
Drink plenty of fluids, call in Bactrim DS 1 by mouth twice a day for 5 days #10 no refills. Avoid  excessive sun exposure while taking the antibiotic. If has  severe symptoms, fever, chills, nausea or pain: Needs to see a local M.D.

## 2013-03-17 ENCOUNTER — Telehealth: Payer: Self-pay

## 2013-03-17 NOTE — Telephone Encounter (Signed)
A/P: Medication reconciled Last mamogram 11/2008 Last Colonoscopy 10 years ago per patient Immunizations due: Flu, zostavax, pneumonia  To discuss with MD possible GERD?

## 2013-03-18 ENCOUNTER — Encounter: Payer: Self-pay | Admitting: Internal Medicine

## 2013-03-18 ENCOUNTER — Ambulatory Visit (INDEPENDENT_AMBULATORY_CARE_PROVIDER_SITE_OTHER): Payer: Medicare Other | Admitting: Internal Medicine

## 2013-03-18 ENCOUNTER — Other Ambulatory Visit: Payer: Self-pay | Admitting: Internal Medicine

## 2013-03-18 VITALS — BP 144/79 | HR 62 | Temp 97.7°F | Ht 61.8 in | Wt 185.0 lb

## 2013-03-18 DIAGNOSIS — E559 Vitamin D deficiency, unspecified: Secondary | ICD-10-CM

## 2013-03-18 DIAGNOSIS — M549 Dorsalgia, unspecified: Secondary | ICD-10-CM

## 2013-03-18 DIAGNOSIS — I10 Essential (primary) hypertension: Secondary | ICD-10-CM

## 2013-03-18 DIAGNOSIS — Z0000 Encounter for general adult medical examination without abnormal findings: Secondary | ICD-10-CM | POA: Diagnosis not present

## 2013-03-18 DIAGNOSIS — N390 Urinary tract infection, site not specified: Secondary | ICD-10-CM | POA: Diagnosis not present

## 2013-03-18 DIAGNOSIS — L989 Disorder of the skin and subcutaneous tissue, unspecified: Secondary | ICD-10-CM | POA: Insufficient documentation

## 2013-03-18 LAB — COMPREHENSIVE METABOLIC PANEL
CO2: 28 mEq/L (ref 19–32)
Creatinine, Ser: 0.6 mg/dL (ref 0.4–1.2)
GFR: 97.08 mL/min (ref >60.00)
Glucose, Bld: 98 mg/dL (ref 70–99)
Total Bilirubin: 0.7 mg/dL (ref 0.3–1.2)
Total Protein: 7.1 g/dL (ref 6.0–8.3)

## 2013-03-18 LAB — CBC WITH DIFFERENTIAL/PLATELET
Eosinophils Relative: 3.1 % (ref 0.0–5.0)
HCT: 42.6 % (ref 36.0–46.0)
Hemoglobin: 14.3 g/dL (ref 12.0–15.0)
Lymphs Abs: 2.2 10*3/uL (ref 0.7–4.0)
Monocytes Relative: 7.4 % (ref 3.0–12.0)
Platelets: 212 10*3/uL (ref 150.0–400.0)
WBC: 5.6 10*3/uL (ref 4.5–10.5)

## 2013-03-18 LAB — URINALYSIS, ROUTINE W REFLEX MICROSCOPIC
Bilirubin Urine: NEGATIVE
Nitrite: NEGATIVE
Specific Gravity, Urine: 1.015 (ref 1.000–1.030)
Total Protein, Urine: NEGATIVE
pH: 8 (ref 5.0–8.0)

## 2013-03-18 NOTE — Assessment & Plan Note (Signed)
Recent symptoms a UTI, status post Cipro, check a UA

## 2013-03-18 NOTE — Telephone Encounter (Signed)
rx refilled per protocol. DJR  

## 2013-03-18 NOTE — Assessment & Plan Note (Addendum)
Td 2009 Pneumonia, shingles, flu shot-- declined . Explained benefits PAPs MMG strongly declined, benefits discussed  DEXA-- had one in the 2000s,   explained benefits of get one again; declined  reportedly Cscope 2003 (-),  neg hemocults 2009, (-) iFOB 2010. Declined further screening  Diet and exercise discussed

## 2013-03-18 NOTE — Assessment & Plan Note (Addendum)
2 months ago her ambulatory BPs were 116/64, she self discontinue med and few weeks later realized her BP was elevated. She restarted diltiazem approximately 2 weeks ago. BP is slt elevated today, recommend self-monitoring, see instructions

## 2013-03-18 NOTE — Assessment & Plan Note (Signed)
Mid R back Pain, patient wonders if could be GERD or a gallbladder problem, that is possible but not likely. Recommend observation and Tylenol as needed, likely is a muscle skeletal pain. If she is not better, she will call

## 2013-03-18 NOTE — Patient Instructions (Signed)
Get your blood work before you leave  Next visit in  6 months    for a check up Please make an appointment  Check the  blood pressure 2 or 3 times a week, be sure it is between 110/60 and 140/85. If it is consistently higher or lower, let me know  Will refer you to the skin doctor   Fall Prevention and Home Safety Falls cause injuries and can affect all age groups. It is possible to use preventive measures to significantly decrease the likelihood of falls. There are many simple measures which can make your home safer and prevent falls. OUTDOORS  Repair cracks and edges of walkways and driveways.  Remove high doorway thresholds.  Trim shrubbery on the main path into your home.  Have good outside lighting.  Clear walkways of tools, rocks, debris, and clutter.  Check that handrails are not broken and are securely fastened. Both sides of steps should have handrails.  Have leaves, snow, and ice cleared regularly.  Use sand or salt on walkways during winter months.  In the garage, clean up grease or oil spills. BATHROOM  Install night lights.  Install grab bars by the toilet and in the tub and shower.  Use non-skid mats or decals in the tub or shower.  Place a plastic non-slip stool in the shower to sit on, if needed.  Keep floors dry and clean up all water on the floor immediately.  Remove soap buildup in the tub or shower on a regular basis.  Secure bath mats with non-slip, double-sided rug tape.  Remove throw rugs and tripping hazards from the floors. BEDROOMS  Install night lights.  Make sure a bedside light is easy to reach.  Do not use oversized bedding.  Keep a telephone by your bedside.  Have a firm chair with side arms to use for getting dressed.  Remove throw rugs and tripping hazards from the floor. KITCHEN  Keep handles on pots and pans turned toward the center of the stove. Use back burners when possible.  Clean up spills quickly and allow time  for drying.  Avoid walking on wet floors.  Avoid hot utensils and knives.  Position shelves so they are not too high or low.  Place commonly used objects within easy reach.  If necessary, use a sturdy step stool with a grab bar when reaching.  Keep electrical cables out of the way.  Do not use floor polish or wax that makes floors slippery. If you must use wax, use non-skid floor wax.  Remove throw rugs and tripping hazards from the floor. STAIRWAYS  Never leave objects on stairs.  Place handrails on both sides of stairways and use them. Fix any loose handrails. Make sure handrails on both sides of the stairways are as long as the stairs.  Check carpeting to make sure it is firmly attached along stairs. Make repairs to worn or loose carpet promptly.  Avoid placing throw rugs at the top or bottom of stairways, or properly secure the rug with carpet tape to prevent slippage. Get rid of throw rugs, if possible.  Have an electrician put in a light switch at the top and bottom of the stairs. OTHER FALL PREVENTION TIPS  Wear low-heel or rubber-soled shoes that are supportive and fit well. Wear closed toe shoes.  When using a stepladder, make sure it is fully opened and both spreaders are firmly locked. Do not climb a closed stepladder.  Add color or contrast paint or  tape to grab bars and handrails in your home. Place contrasting color strips on first and last steps.  Learn and use mobility aids as needed. Install an electrical emergency response system.  Turn on lights to avoid dark areas. Replace light bulbs that burn out immediately. Get light switches that glow.  Arrange furniture to create clear pathways. Keep furniture in the same place.  Firmly attach carpet with non-skid or double-sided tape.  Eliminate uneven floor surfaces.  Select a carpet pattern that does not visually hide the edge of steps.  Be aware of all pets. OTHER HOME SAFETY TIPS  Set the water  temperature for 120 F (48.8 C).  Keep emergency numbers on or near the telephone.  Keep smoke detectors on every level of the home and near sleeping areas. Document Released: 06/01/2002 Document Revised: 12/11/2011 Document Reviewed: 08/31/2011 [facility] Patient Information 2014 [place], [place].

## 2013-03-18 NOTE — Assessment & Plan Note (Signed)
Continue with supplements, the last time we checked her vitamin D was better

## 2013-03-18 NOTE — Progress Notes (Signed)
  Subjective:    Patient ID: Marie Hebert, female    DOB: 01-15-1933, 79 y.o.   MRN: [id number]  HPI  Here for Medicare AWV: 1. Risk factors based on Past M, S, F history: reviewed 2. Physical Activities: active at the seniors home, take indoor walks 3. Depression/mood:  Neg screening 4. Hearing:  No problemss noted or reported   5. ADL's:  Independent, still drives   6. Fall Risk: no recent falls , prevention discussed   7. home Safety: does feelsafe at home   8. Height, weight, &visual acuity: see VS, saw the eye doctor July 2014 , +L cataract 9. Counseling: provided 10. Labs ordered based on risk factors: if needed   11. Referral Coordination: if needed 12.  Care Plan, see assessment and plan   13.   Cognitive Assessment: Cognition within normal. Motor skills limited by knee pain  In addition, today we discussed the following: Hypertension, good medication compliance, BP today slightly elevated, see assessment and plan UTI--she call with symptoms of a UTI last month, had Bactrim, now asymptomatic. Vitamin D deficiency- good compliance with supplements 2 weeks history of pain at the mid back, right side, does not change with torso motion, increased with food?, no change with deep breaths, no fever, chills or cough.No associated with nausea. Skin lesion at the right leg for 6 months  Past Medical History: HTN    Palpitations.. s/p Cards eval 07-2006, ECHO NL Vitamin D deficiency   Back pain on-off DJD Meniscal tear 9-12   Past Surgical History: laser, L retina 2009 R cataract surgery 08-2008  Social History: Widow, moved back to [place] from [place] 06-2011, lives by herself at a seniors apartment 4 kids, 8 grandkids from [place] Tobacco Use - No.   ETOH-- no    Family History: DM - no MI - no breast Ca - no colon Ca - no   Review of Systems No  SSCP, SOB, lower extremity edema No nausea, vomiting diarrhea No blood in the stools or abdominal pain No F/C No  dysphagia or odynophagia No cough, sputum production No dysuria, gross hematuria, difficulty urinating           Objective:   Physical Exam  Musculoskeletal:       Arms: Skin:      BP 144/79  Pulse 62  Temp(Src) 97.7 F (36.5 C)  Ht 5' 1.8" (1.57 m)  Wt 185 lb (83.915 kg)  BMI 34.04 kg/m2  SpO2 97% General -- alert, well-developed, NAD.  Neck --no thyromegaly , normal carotid pulse Lungs -- normal respiratory effort, no intercostal retractions, no accessory muscle use, and normal breath sounds.  Heart-- normal rate, regular rhythm, no murmur.  Abdomen-- Not distended, good bowel sounds,soft, non-tender. No mass, rebound or rigidity.  Extremities-- no pretibial edema bilaterally  Neurologic--  alert & oriented X3.  Speech normal, gait normal, strength normal in all extremities.  Psych-- Cognition and judgment appear intact. Cooperative with normal attention span and concentration. No anxious appearing , no depressed appearing.       Assessment & Plan:

## 2013-03-18 NOTE — Assessment & Plan Note (Signed)
6 months history of right pretibial area skin lesion, refer to dermatology

## 2013-03-23 ENCOUNTER — Telehealth: Payer: Self-pay | Admitting: *Deleted

## 2013-03-23 ENCOUNTER — Encounter: Payer: Self-pay | Admitting: *Deleted

## 2013-03-23 NOTE — Telephone Encounter (Signed)
Message copied by Eustace Quail on Mon Mar 23, 2013  1:26 PM ------      Message from: Willow Ora E      Created: Mon Mar 23, 2013  1:15 PM       Send a Chemical engineer,      Your liver, kidney, potassium and blood count are normal.      The urine test is completely normal as well.      These are good results! ------

## 2013-03-23 NOTE — Telephone Encounter (Signed)
Patient called and requested a copy of recent lab results be mailed to her home. Results mailed.

## 2013-03-23 NOTE — Telephone Encounter (Signed)
Letter sent. DJR  

## 2013-03-24 ENCOUNTER — Telehealth: Payer: Self-pay | Admitting: Internal Medicine

## 2013-03-24 NOTE — Telephone Encounter (Signed)
Patient states that she is having UTI symptoms and wants to know if we can send an rx [facility]. Patient says we have sent her a prescription before for this last month and in July. She says it keeps coming back. Please advise.

## 2013-03-24 NOTE — Telephone Encounter (Signed)
Advise patient: Needs to come to the office and leave a urine sample ----UA, urine culture dx UTI Once we have the urine sample---->  call Cipro 100 mg one by mouth twice a day #10 Arrange that referral to urology, DX recurrent UTIs

## 2013-03-25 ENCOUNTER — Telehealth: Payer: Self-pay | Admitting: *Deleted

## 2013-03-25 ENCOUNTER — Other Ambulatory Visit: Payer: Self-pay | Admitting: *Deleted

## 2013-03-25 DIAGNOSIS — N390 Urinary tract infection, site not specified: Secondary | ICD-10-CM

## 2013-03-25 NOTE — Telephone Encounter (Signed)
Per the phone call from yesterday: Advise patient:  Needs to come to the office and leave a urine sample ----UA, urine culture dx UTI ------------------------------> does not need a OV, just leave a samnple Once we have the urine sample----> call Cipro 100 mg one by mouth twice a day #10  Arrange that referral to urology, DX recurrent UTIs

## 2013-03-25 NOTE — Telephone Encounter (Signed)
Patient called and stated that she is having dysuria x 2 days. She would like to know if something could be called in, instead of her having to come in for an OV. Please advise.

## 2013-03-25 NOTE — Telephone Encounter (Signed)
Patient called and stated she would come and provide a urine sample. Orders for UA and urine culture have been entered. Referral also made to urology.

## 2013-03-26 ENCOUNTER — Other Ambulatory Visit (INDEPENDENT_AMBULATORY_CARE_PROVIDER_SITE_OTHER): Payer: Medicare Other

## 2013-03-26 DIAGNOSIS — L821 Other seborrheic keratosis: Secondary | ICD-10-CM | POA: Diagnosis not present

## 2013-03-26 DIAGNOSIS — N390 Urinary tract infection, site not specified: Secondary | ICD-10-CM | POA: Diagnosis not present

## 2013-03-26 DIAGNOSIS — L820 Inflamed seborrheic keratosis: Secondary | ICD-10-CM | POA: Diagnosis not present

## 2013-03-27 LAB — URINALYSIS, ROUTINE W REFLEX MICROSCOPIC
Bilirubin Urine: NEGATIVE
Glucose, UA: NEGATIVE mg/dL
Hgb urine dipstick: NEGATIVE
Leukocytes, UA: NEGATIVE
Nitrite: NEGATIVE
Protein, ur: NEGATIVE mg/dL
Urobilinogen, UA: 0.2 mg/dL (ref 0.0–1.0)

## 2013-03-30 ENCOUNTER — Other Ambulatory Visit: Payer: Self-pay | Admitting: *Deleted

## 2013-03-30 LAB — CULTURE, URINE COMPREHENSIVE: Colony Count: >=100000

## 2013-03-30 MED ORDER — CIPROFLOXACIN HCL 500 MG PO TABS: 500.0000 mg | ORAL_TABLET | Freq: Two times a day (BID) | ORAL | Status: DC

## 2013-04-10 ENCOUNTER — Other Ambulatory Visit: Payer: Self-pay | Admitting: *Deleted

## 2013-04-10 MED ORDER — LEVOFLOXACIN 500 MG PO TABS: 500.0000 mg | ORAL_TABLET | Freq: Every day | ORAL | Status: DC

## 2013-04-20 DIAGNOSIS — N3020 Other chronic cystitis without hematuria: Secondary | ICD-10-CM | POA: Diagnosis not present

## 2013-04-23 DIAGNOSIS — H2510 Age-related nuclear cataract, unspecified eye: Secondary | ICD-10-CM | POA: Diagnosis not present

## 2013-04-30 ENCOUNTER — Other Ambulatory Visit: Payer: Self-pay

## 2013-05-05 DIAGNOSIS — N952 Postmenopausal atrophic vaginitis: Secondary | ICD-10-CM | POA: Diagnosis not present

## 2013-05-05 DIAGNOSIS — N3020 Other chronic cystitis without hematuria: Secondary | ICD-10-CM | POA: Diagnosis not present

## 2013-05-05 DIAGNOSIS — N281 Cyst of kidney, acquired: Secondary | ICD-10-CM | POA: Diagnosis not present

## 2013-05-05 DIAGNOSIS — N390 Urinary tract infection, site not specified: Secondary | ICD-10-CM | POA: Diagnosis not present

## 2013-05-06 DIAGNOSIS — D485 Neoplasm of uncertain behavior of skin: Secondary | ICD-10-CM | POA: Diagnosis not present

## 2013-09-01 DIAGNOSIS — N952 Postmenopausal atrophic vaginitis: Secondary | ICD-10-CM | POA: Diagnosis not present

## 2013-09-01 DIAGNOSIS — N281 Cyst of kidney, acquired: Secondary | ICD-10-CM | POA: Diagnosis not present

## 2013-09-01 DIAGNOSIS — N3020 Other chronic cystitis without hematuria: Secondary | ICD-10-CM | POA: Diagnosis not present

## 2013-09-14 ENCOUNTER — Other Ambulatory Visit: Payer: Self-pay | Admitting: Internal Medicine

## 2013-09-28 DIAGNOSIS — M999 Biomechanical lesion, unspecified: Secondary | ICD-10-CM | POA: Diagnosis not present

## 2013-09-28 DIAGNOSIS — S139XXA Sprain of joints and ligaments of unspecified parts of neck, initial encounter: Secondary | ICD-10-CM | POA: Diagnosis not present

## 2013-09-28 DIAGNOSIS — M9981 Other biomechanical lesions of cervical region: Secondary | ICD-10-CM | POA: Diagnosis not present

## 2013-09-28 DIAGNOSIS — S335XXA Sprain of ligaments of lumbar spine, initial encounter: Secondary | ICD-10-CM | POA: Diagnosis not present

## 2013-09-28 DIAGNOSIS — S239XXA Sprain of unspecified parts of thorax, initial encounter: Secondary | ICD-10-CM | POA: Diagnosis not present

## 2013-12-16 ENCOUNTER — Other Ambulatory Visit: Payer: Self-pay | Admitting: Internal Medicine

## 2013-12-17 DIAGNOSIS — S239XXA Sprain of unspecified parts of thorax, initial encounter: Secondary | ICD-10-CM | POA: Diagnosis not present

## 2013-12-17 DIAGNOSIS — M9981 Other biomechanical lesions of cervical region: Secondary | ICD-10-CM | POA: Diagnosis not present

## 2013-12-17 DIAGNOSIS — S335XXA Sprain of ligaments of lumbar spine, initial encounter: Secondary | ICD-10-CM | POA: Diagnosis not present

## 2013-12-17 DIAGNOSIS — S139XXA Sprain of joints and ligaments of unspecified parts of neck, initial encounter: Secondary | ICD-10-CM | POA: Diagnosis not present

## 2013-12-17 DIAGNOSIS — M999 Biomechanical lesion, unspecified: Secondary | ICD-10-CM | POA: Diagnosis not present

## 2013-12-22 DIAGNOSIS — H2510 Age-related nuclear cataract, unspecified eye: Secondary | ICD-10-CM | POA: Diagnosis not present

## 2014-03-17 DIAGNOSIS — S139XXA Sprain of joints and ligaments of unspecified parts of neck, initial encounter: Secondary | ICD-10-CM | POA: Diagnosis not present

## 2014-03-17 DIAGNOSIS — M9981 Other biomechanical lesions of cervical region: Secondary | ICD-10-CM | POA: Diagnosis not present

## 2014-03-17 DIAGNOSIS — S239XXA Sprain of unspecified parts of thorax, initial encounter: Secondary | ICD-10-CM | POA: Diagnosis not present

## 2014-03-17 DIAGNOSIS — S335XXA Sprain of ligaments of lumbar spine, initial encounter: Secondary | ICD-10-CM | POA: Diagnosis not present

## 2014-03-17 DIAGNOSIS — M999 Biomechanical lesion, unspecified: Secondary | ICD-10-CM | POA: Diagnosis not present

## 2014-03-26 ENCOUNTER — Other Ambulatory Visit: Payer: Self-pay

## 2014-03-26 ENCOUNTER — Telehealth: Payer: Self-pay | Admitting: Internal Medicine

## 2014-03-26 MED ORDER — DILTIAZEM HCL ER COATED BEADS 240 MG PO CP24: ORAL_CAPSULE | ORAL | Status: DC

## 2014-03-26 NOTE — Telephone Encounter (Signed)
Caller name: Takyia Relation to pt: self Call back number: [phone] Pharmacy: [address]  Reason for call:   Patient requesting refill of diltiazem

## 2014-03-26 NOTE — Telephone Encounter (Signed)
[facility]

## 2014-04-22 ENCOUNTER — Encounter: Payer: Self-pay | Admitting: Family Medicine

## 2014-04-22 ENCOUNTER — Ambulatory Visit (INDEPENDENT_AMBULATORY_CARE_PROVIDER_SITE_OTHER): Payer: Medicare Other | Admitting: Family Medicine

## 2014-04-22 VITALS — BP 132/80 | HR 73 | Temp 97.9°F | Resp 16 | Wt 183.5 lb

## 2014-04-22 DIAGNOSIS — R300 Dysuria: Secondary | ICD-10-CM

## 2014-04-22 DIAGNOSIS — N390 Urinary tract infection, site not specified: Secondary | ICD-10-CM

## 2014-04-22 DIAGNOSIS — R319 Hematuria, unspecified: Secondary | ICD-10-CM | POA: Diagnosis not present

## 2014-04-22 DIAGNOSIS — R82998 Other abnormal findings in urine: Secondary | ICD-10-CM

## 2014-04-22 LAB — POCT URINALYSIS DIPSTICK
BILIRUBIN UA: NEGATIVE
Glucose, UA: NEGATIVE
KETONES UA: NEGATIVE
Nitrite, UA: NEGATIVE
SPEC GRAV UA: 1.025
Urobilinogen, UA: 0.2
pH, UA: 6

## 2014-04-22 MED ORDER — CEPHALEXIN 500 MG PO CAPS
500.0000 mg | ORAL_CAPSULE | Freq: Two times a day (BID) | ORAL | Status: AC
Start: 2014-04-22 — End: 2014-05-02

## 2014-04-22 NOTE — Assessment & Plan Note (Signed)
New to provider but recurrent for pt.  Pts sxs and UA consistent w/ infxn.  Start keflex twice daily while waiting for urine culture.  Reviewed supportive care and red flags that should prompt return.  Pt expressed understanding and is in agreement w/ plan.

## 2014-04-22 NOTE — Progress Notes (Signed)
Pre visit review using our clinic review tool, if applicable. No additional management support is needed unless otherwise documented below in the visit note. 

## 2014-04-22 NOTE — Progress Notes (Signed)
   Subjective:    Patient ID: Marie Hebert, female    DOB: 28-Mar-1933, 80 y.o.   MRN: [id number]  Dysuria    UTI- pt has hx of recurrent UTIs.  Has seen urology previously.  Pt reports she developed pain w/ urination on 10/13 and 'took 6 pills' (Keflex) and 'i felt better' but 'it came back again'.  + burning, suprapubic pressure, 'that feeling that something is going to come out'.  + frequency, urgency.  No blood.  No fevers.  Urology recommended vaginal estrogen but pt never started due to possible side effects 'cancer all over'.   Review of Systems  Genitourinary: Positive for dysuria.   For ROS see HPI     Objective:   Physical Exam  Vitals reviewed. Constitutional: She appears well-developed and well-nourished. No distress.  Abdominal: Soft. She exhibits no distension. There is no tenderness (no suprapubic or CVA tenderness).          Assessment & Plan:

## 2014-04-22 NOTE — Addendum Note (Signed)
Addended by: Modena Morrow D on: 04/22/2014 05:39 PM   Modules accepted: Orders

## 2014-04-22 NOTE — Patient Instructions (Signed)
Follow up as needed Start the Keflex twice daily x5 days Drink plenty of fluids Call with any questions or concerns Hang in there!!

## 2014-04-26 LAB — URINE CULTURE: Colony Count: 65000

## 2014-04-27 ENCOUNTER — Telehealth: Payer: Self-pay | Admitting: Internal Medicine

## 2014-04-27 ENCOUNTER — Other Ambulatory Visit: Payer: Self-pay | Admitting: General Practice

## 2014-04-27 MED ORDER — AMPICILLIN 500 MG PO CAPS: 500.0000 mg | ORAL_CAPSULE | Freq: Four times a day (QID) | ORAL | Status: DC

## 2014-04-27 NOTE — Telephone Encounter (Signed)
Pt states she is returning your call, regarding her rx, please call back at [phone].

## 2014-04-27 NOTE — Telephone Encounter (Signed)
Pt returning your call about lab results  

## 2014-04-27 NOTE — Telephone Encounter (Signed)
Pt notified of results

## 2014-05-05 ENCOUNTER — Telehealth: Payer: Self-pay | Admitting: Internal Medicine

## 2014-05-05 NOTE — Telephone Encounter (Signed)
Ok to come to lab for UA for test of cure

## 2014-05-05 NOTE — Telephone Encounter (Signed)
Please advise 

## 2014-05-05 NOTE — Telephone Encounter (Signed)
Caller name: Tnya Relation to pt: self Call back number: [phone]      Patient says to call twice if she doesn't answer on first call. Pharmacy:  Reason for call:   Patient states that she would like to give another urine sample. She has finished the medication. Patient states that she saw Dr. Birdie Riddle regarding this.

## 2014-05-06 ENCOUNTER — Other Ambulatory Visit (INDEPENDENT_AMBULATORY_CARE_PROVIDER_SITE_OTHER): Payer: Medicare Other

## 2014-05-06 DIAGNOSIS — R300 Dysuria: Secondary | ICD-10-CM | POA: Diagnosis not present

## 2014-05-06 LAB — POCT URINALYSIS DIPSTICK
Bilirubin, UA: NEGATIVE
Glucose, UA: NEGATIVE
Ketones, UA: NEGATIVE
LEUKOCYTES UA: NEGATIVE
NITRITE UA: NEGATIVE
PH UA: 6.5
PROTEIN UA: NEGATIVE
RBC UA: NEGATIVE
Spec Grav, UA: 1.015
Urobilinogen, UA: 0.2

## 2014-05-06 NOTE — Telephone Encounter (Signed)
Called pt twice and lmovm for her to come in to repeat UA.

## 2014-05-07 DIAGNOSIS — R300 Dysuria: Secondary | ICD-10-CM | POA: Diagnosis not present

## 2014-05-07 NOTE — Addendum Note (Signed)
Addended by: Harl Bowie on: 05/07/2014 01:40 PM   Modules accepted: Orders

## 2014-05-08 LAB — URINE CULTURE: Colony Count: 50000

## 2014-05-10 NOTE — Telephone Encounter (Signed)
Pt inquiring about urine results.

## 2014-05-11 NOTE — Telephone Encounter (Signed)
Called and left a detailed message informing pt that results had been sent to mychart. No signs of infection .

## 2014-05-13 ENCOUNTER — Ambulatory Visit (INDEPENDENT_AMBULATORY_CARE_PROVIDER_SITE_OTHER): Payer: Medicare Other | Admitting: Internal Medicine

## 2014-05-13 ENCOUNTER — Encounter: Payer: Self-pay | Admitting: Internal Medicine

## 2014-05-13 ENCOUNTER — Other Ambulatory Visit (HOSPITAL_COMMUNITY)
Admission: RE | Admit: 2014-05-13 | Discharge: 2014-05-13 | Disposition: A | Discharge status: Home / Self Care | Payer: Medicare Other | Source: Ambulatory Visit | Attending: Internal Medicine | Admitting: Internal Medicine

## 2014-05-13 DIAGNOSIS — N760 Acute vaginitis: Secondary | ICD-10-CM | POA: Insufficient documentation

## 2014-05-13 DIAGNOSIS — R350 Frequency of micturition: Secondary | ICD-10-CM

## 2014-05-13 DIAGNOSIS — N390 Urinary tract infection, site not specified: Secondary | ICD-10-CM | POA: Diagnosis not present

## 2014-05-13 DIAGNOSIS — B3731 Acute candidiasis of vulva and vagina: Secondary | ICD-10-CM

## 2014-05-13 DIAGNOSIS — B373 Candidiasis of vulva and vagina: Secondary | ICD-10-CM | POA: Diagnosis not present

## 2014-05-13 LAB — URINALYSIS, ROUTINE W REFLEX MICROSCOPIC
Bilirubin Urine: NEGATIVE
Ketones, ur: NEGATIVE
Nitrite: NEGATIVE
Specific Gravity, Urine: <=1.005 — AB (ref 1.000–1.030)
Total Protein, Urine: NEGATIVE
URINE GLUCOSE: NEGATIVE
UROBILINOGEN UA: 0.2 (ref 0.0–1.0)
pH: 7 (ref 5.0–8.0)

## 2014-05-13 LAB — POCT URINALYSIS DIPSTICK
Bilirubin, UA: NEGATIVE
GLUCOSE UA: NEGATIVE
Ketones, UA: NEGATIVE
Nitrite, UA: NEGATIVE
Protein, UA: NEGATIVE
Urobilinogen, UA: 4
pH, UA: 6.5

## 2014-05-13 MED ORDER — FLUCONAZOLE 150 MG PO TABS: 150.0000 mg | ORAL_TABLET | Freq: Every day | ORAL | Status: DC

## 2014-05-13 MED ORDER — NYSTATIN-TRIAMCINOLONE 100000-0.1 UNIT/GM-% EX OINT: 1.0000 "application " | TOPICAL_OINTMENT | Freq: Two times a day (BID) | CUTANEOUS | Status: DC

## 2014-05-13 NOTE — Progress Notes (Signed)
Subjective:    Patient ID: Marie Hebert, female    DOB: 1932-09-13, 80 y.o.   MRN: [id number]  DOS:  05/13/2014 Type of visit - description : Acute Interval history: Patient was seen 04/22/2014, she had urinary symptoms, self treated with Keflex prior to visit , was not better, was having dysuria and suprapubic pressure. Urine culture showed Escherichia coli and enterococcus, in addition to Keflex she was prescribed ampicillin. Reports that she took antibiotics, and urinary symptoms never went completely away. 2 days ago she noticed blood from the vagina when she wiped, apparently no discharge. + Itching which is severe and reports no rash, using Monistat without much improvement   ROS No fever chills Again continue with mild dysuria but no gross hematuria No nausea, vomiting or diarrhea  Past Medical History  Diagnosis Date  . Hypertension   . Palpitations   . Dizziness   . Visual disturbance   . Back pain     Past Surgical History  Procedure Laterality Date  . Retinal laser procedure    . Cataract extraction  2010    right    History   Social History  . Marital Status: Widowed    Spouse Name: N/A    Number of Children: 4  . Years of Education: N/A   Occupational History  . Not on file.   Social History Main Topics  . Smoking status: Never Smoker   . Smokeless tobacco: Not on file  . Alcohol Use: No  . Drug Use: No  . Sexual Activity: Not on file   Other Topics Concern  . Not on file   Social History Narrative        Medication List       This list is accurate as of: 05/13/14  7:27 PM.  Always use your most recent med list.               CO Q-10 PLUS PO  Take 1 tablet by mouth daily.     diltiazem 240 MG 24 hr capsule  Commonly known as:  CARDIZEM CD  Take one capsule daily.     Fish Oil 1000 MG Caps  Take 1 capsule by mouth daily.     fluconazole 150 MG tablet  Commonly known as:  DIFLUCAN  Take 1 tablet (150 mg total) by mouth  daily.     Magnesium 500 MG Caps  Take 1 capsule by mouth daily.     nystatin-triamcinolone ointment  Commonly known as:  MYCOLOG  Apply 1 application topically 2 (two) times daily.     vitamin C 500 MG tablet  Commonly known as:  ASCORBIC ACID  Take 500 mg by mouth daily.     Vitamin D 1000 UNITS capsule  Take by mouth 2 (two) times daily.           Objective:   Physical Exam BP 112/68 mmHg  Pulse 64  Temp(Src) 97.6 F (36.4 C) (Oral)  Wt 182 lb 6 oz (82.725 kg)  SpO2 97% General -- alert, well-developed, NAD.    Abdomen-- Not distended, good bowel sounds,soft, non-tender. No rebound or rigidity. GU-- vulva with mild redness and a small amount of white discharge Vagina, slightly pale, not dry, + white discharge, no blood or erythema; cervix poorly visualized due to redundant tissue. Bimanual exam no CMT or mass  Extremties-- no pretibial edema bilaterally  Neurologic--  alert & oriented X3. Speech normal, gait appropriate for age, strength symmetric and appropriate for  age.  Psych-- Cognition and judgment appear intact. Cooperative with normal attention span and concentration. No anxious or depressed appearing.       Assessment & Plan:    Vaginitis  Patient reports vaginal bleeding, on exam there is a white discharge, I didn't see any blood, or lesion. Bleeding likely related to vaginitis. Wet prep sent Will Rx Diflucan, Mycolog. If not better will need a gynecology referral

## 2014-05-13 NOTE — Patient Instructions (Signed)
Take Diflucan 1 tablet a day for 3 days Use the new cream vaginally call Mycolog twice a day Stop Monistat Do not take any antibiotic unless prescribed from this office Come back in 3 weeks Call if the symptoms are worse

## 2014-05-13 NOTE — Progress Notes (Signed)
Pre visit review using our clinic review tool, if applicable. No additional management support is needed unless otherwise documented below in the visit note. 

## 2014-05-13 NOTE — Assessment & Plan Note (Addendum)
Had extensive workup by urology, last visit 08-2013, note reviewed:  main problem was atrophic vaginitis, patient declined to use hormonal creams despite the fact that they are very low risk for cancer. See history of present illness, recently diagnosed with a UTI with a urine culture, status post antibiotics, has persistent dysuria, I wonder if the dysuria is rather related with current vaginitis. Udip +leuk, blood (contamination from vaginitis?) Plan: Send a urine culture, treat with antibiotics if appropriate

## 2014-05-14 LAB — CERVICOVAGINAL ANCILLARY ONLY
WET PREP (BD AFFIRM): NEGATIVE
Wet Prep (BD Affirm): NEGATIVE
Wet Prep (BD Affirm): NEGATIVE

## 2014-05-14 LAB — URINE CULTURE: Colony Count: >=100000

## 2014-05-18 ENCOUNTER — Telehealth: Payer: Self-pay | Admitting: Internal Medicine

## 2014-05-18 MED ORDER — FLUCONAZOLE 150 MG PO TABS: 150.0000 mg | ORAL_TABLET | Freq: Once | ORAL | Status: DC

## 2014-05-18 MED ORDER — LEVOFLOXACIN 500 MG PO TABS: 500.0000 mg | ORAL_TABLET | Freq: Every day | ORAL | Status: DC

## 2014-05-18 NOTE — Telephone Encounter (Signed)
Pt returning your call back

## 2014-05-18 NOTE — Telephone Encounter (Signed)
Spoke with Pt, informed her to begin taking Levaquin 500 mg 1 daily, and diflucan 150 mg 1 tablet daily. Informed her that if she is still not feeling better after second round of abx she may need referral to urologist. Pt verbalized understanding. [facility]

## 2014-05-18 NOTE — Telephone Encounter (Signed)
Spoke with Pt, was informed she is still having urinary frequency and pressure, she is worried because she states she is unable to hold her bladder any longer and has finished taking diflucan. Pt denies any fever, N/V. Pt is worried that she will not be able to see anyone and get relief with Thursday being Thanksgiving. I informed her that I will let Dr. Larose Kells know of her symptoms and will return call as soon as I know something. Pt verbalized understanding.

## 2014-05-18 NOTE — Telephone Encounter (Signed)
Persistent symptoms, last urine culture showed Escherichia coli and enterococcus. Plan:  Levaquin 500 mg one by mouth daily  #5 Also call  Diflucan 150 mg 1 by mouth daily #2 (only to be taken if she develops a yeast infection after the antibiotics) If not better, will refer to urology.

## 2014-05-24 ENCOUNTER — Other Ambulatory Visit: Payer: Self-pay

## 2014-06-07 ENCOUNTER — Ambulatory Visit (INDEPENDENT_AMBULATORY_CARE_PROVIDER_SITE_OTHER): Payer: Medicare Other | Admitting: Internal Medicine

## 2014-06-07 ENCOUNTER — Encounter: Payer: Self-pay | Admitting: Internal Medicine

## 2014-06-07 VITALS — BP 126/77 | HR 77 | Temp 98.4°F | Ht 61.0 in | Wt 184.0 lb

## 2014-06-07 DIAGNOSIS — N390 Urinary tract infection, site not specified: Secondary | ICD-10-CM | POA: Diagnosis not present

## 2014-06-07 DIAGNOSIS — Z0000 Encounter for general adult medical examination without abnormal findings: Secondary | ICD-10-CM | POA: Diagnosis not present

## 2014-06-07 DIAGNOSIS — I10 Essential (primary) hypertension: Secondary | ICD-10-CM

## 2014-06-07 DIAGNOSIS — E559 Vitamin D deficiency, unspecified: Secondary | ICD-10-CM | POA: Diagnosis not present

## 2014-06-07 NOTE — Assessment & Plan Note (Signed)
Td 2009 Pneumonia, shingles, flu shot-- explained benefits, declined  PAPs MMG strongly declined, benefits discussed  DEXA-- had one in the 2000s,   explained benefits of get one again; declined  reportedly Cscope 2003 (-),  neg hemocults 2009, (-) iFOB 2010. Declined further screening  Diet and exercise discussed

## 2014-06-07 NOTE — Assessment & Plan Note (Signed)
Checking labs

## 2014-06-07 NOTE — Progress Notes (Signed)
Subjective:    Patient ID: Marie Hebert, female    DOB: [birth year], 80 y.o.   MRN: [id number]  DOS:  06/07/2014 Type of visit - description :   Here for Medicare AWV: 1. Risk factors based on Past M, S, F history: reviewed 2. Physical Activities: active at the seniors home, takes indoor walks 3. Depression/mood: Neg screening 4. Hearing:  No problemss noted or reported   5. ADL's:  Independent, still drives   6. Fall Risk: no recent falls , prevention discussed   7. home Safety: does feelsafe at home   8. Height, weight, &visual acuity: see VS, saw the eye doctor this year , h/o L cataract 9. Counseling: provided 10. Labs ordered based on risk factors: if needed   11. Referral Coordination: if needed 12.  Care Plan, see assessment and plan   13.   Cognitive Assessment: Cognition within normal. Motor skills appropriate for age 36. Care team updated 15. Written care plan provided  In addition, today we discussed the following: Hypertension, good medication compliance, ambulatory BP very good today History of recent vaginitis, doing better, still using Mycolog. Denies vaginal discharge or itching Recurrent UTI, most recent urine culture contaminated, status post Levaquin, currently asymptomatic   ROS Denies chest pain or difficulty breathing No nausea, vomiting, diarrhea No GERD or dysphagia symptoms No headache or dizziness  Past Medical History  Diagnosis Date  . Hypertension   . Palpitations   . Dizziness   . Back pain     Past Surgical History  Procedure Laterality Date  . Retinal laser procedure    . Cataract extraction  2010    right    History   Social History  . Marital Status: Widowed    Spouse Name: N/A    Number of Children: 4  . Years of Education: N/A   Occupational History  . n/a    Social History Main Topics  . Smoking status: Never Smoker   . Smokeless tobacco: Never Used  . Alcohol Use: No  . Drug Use: No  . Sexual Activity: Not on  file   Other Topics Concern  . Not on file   Social History Narrative   Lives by herself       Family History  Problem Relation Age of Onset  . CAD Neg Hx   . Diabetes Neg Hx   . Colon cancer Neg Hx   . Breast cancer Neg Hx        Medication List       This list is accurate as of: 06/07/14  6:37 PM.  Always use your most recent med list.               CO Q-10 PLUS PO  Take 1 tablet by mouth daily.     diltiazem 240 MG 24 hr capsule  Commonly known as:  CARDIZEM CD  Take one capsule daily.     Fish Oil 1000 MG Caps  Take 1 capsule by mouth daily.     Magnesium 500 MG Caps  Take 1 capsule by mouth daily.     nystatin-triamcinolone ointment  Commonly known as:  MYCOLOG  Apply 1 application topically 2 (two) times daily.     vitamin C 500 MG tablet  Commonly known as:  ASCORBIC ACID  Take 500 mg by mouth daily.     Vitamin D 1000 UNITS capsule  Take by mouth 2 (two) times daily.  Objective:   Physical Exam BP 126/77 mmHg  Pulse 77  Temp(Src) 98.4 F (36.9 C) (Oral)  Ht 5\' 1"  (1.549 m)  Wt 184 lb (83.462 kg)  BMI 34.78 kg/m2  SpO2 97% General -- alert, well-developed, NAD.  Neck --no thyromegaly  HEENT-- Not pale. Lungs -- normal respiratory effort, no intercostal retractions, no accessory muscle use, and normal breath sounds.  Heart-- normal rate, regular rhythm, no murmur.  Abdomen-- Not distended, good bowel sounds,soft, non-tender.  Extremities-- no pretibial edema bilaterally  Neurologic--  alert & oriented X3. Speech normal, gait appropriate for age, strength symmetric and appropriate for age.  Psych-- Cognition and judgment appear intact. Cooperative with normal attention span and concentration. No anxious or depressed appearing.      Assessment & Plan:  Hypertension, well controlled, continue with Cardizem, check a CMP, TSH Vaginitis, improved, still using Mycolog, recommend to use as needed Skin lesion, saw dermatology last  year

## 2014-06-07 NOTE — Progress Notes (Signed)
Pre visit review using our clinic review tool, if applicable. No additional management support is needed unless otherwise documented below in the visit note. 

## 2014-06-07 NOTE — Assessment & Plan Note (Signed)
last culture  contaminated, currently asymptomatic, recommend observation. She has a history of a left renal cyst, not complex, follow-up by urology

## 2014-06-07 NOTE — Patient Instructions (Addendum)
On your way out, please schedule labs to be done this week.   Check the  blood pressure 2 or 3 times a month   Be sure your blood pressure is between  145/85  and 110/65.  if it is consistently higher or lower, let me know    Please come back to the office in 1 year for a physical exam. Come back fasting       Fall Prevention and Home Safety Falls cause injuries and can affect all age groups. It is possible to use preventive measures to significantly decrease the likelihood of falls. There are many simple measures which can make your home safer and prevent falls. OUTDOORS  Repair cracks and edges of walkways and driveways.  Remove high doorway thresholds.  Trim shrubbery on the main path into your home.  Have good outside lighting.  Clear walkways of tools, rocks, debris, and clutter.  Check that handrails are not broken and are securely fastened. Both sides of steps should have handrails.  Have leaves, snow, and ice cleared regularly.  Use sand or salt on walkways during winter months.  In the garage, clean up grease or oil spills. BATHROOM  Install night lights.  Install grab bars by the toilet and in the tub and shower.  Use non-skid mats or decals in the tub or shower.  Place a plastic non-slip stool in the shower to sit on, if needed.  Keep floors dry and clean up all water on the floor immediately.  Remove soap buildup in the tub or shower on a regular basis.  Secure bath mats with non-slip, double-sided rug tape.  Remove throw rugs and tripping hazards from the floors. BEDROOMS  Install night lights.  Make sure a bedside light is easy to reach.  Do not use oversized bedding.  Keep a telephone by your bedside.  Have a firm chair with side arms to use for getting dressed.  Remove throw rugs and tripping hazards from the floor. KITCHEN  Keep handles on pots and pans turned toward the center of the stove. Use back burners when possible.  Clean up  spills quickly and allow time for drying.  Avoid walking on wet floors.  Avoid hot utensils and knives.  Position shelves so they are not too high or low.  Place commonly used objects within easy reach.  If necessary, use a sturdy step stool with a grab bar when reaching.  Keep electrical cables out of the way.  Do not use floor polish or wax that makes floors slippery. If you must use wax, use non-skid floor wax.  Remove throw rugs and tripping hazards from the floor. STAIRWAYS  Never leave objects on stairs.  Place handrails on both sides of stairways and use them. Fix any loose handrails. Make sure handrails on both sides of the stairways are as long as the stairs.  Check carpeting to make sure it is firmly attached along stairs. Make repairs to worn or loose carpet promptly.  Avoid placing throw rugs at the top or bottom of stairways, or properly secure the rug with carpet tape to prevent slippage. Get rid of throw rugs, if possible.  Have an electrician put in a light switch at the top and bottom of the stairs. OTHER FALL PREVENTION TIPS  Wear low-heel or rubber-soled shoes that are supportive and fit well. Wear closed toe shoes.  When using a stepladder, make sure it is fully opened and both spreaders are firmly locked. Do not climb  a closed stepladder.  Add color or contrast paint or tape to grab bars and handrails in your home. Place contrasting color strips on first and last steps.  Learn and use mobility aids as needed. Install an electrical emergency response system.  Turn on lights to avoid dark areas. Replace light bulbs that burn out immediately. Get light switches that glow.  Arrange furniture to create clear pathways. Keep furniture in the same place.  Firmly attach carpet with non-skid or double-sided tape.  Eliminate uneven floor surfaces.  Select a carpet pattern that does not visually hide the edge of steps.  Be aware of all pets. OTHER HOME SAFETY  TIPS  Set the water temperature for 120 F (48.8 C).  Keep emergency numbers on or near the telephone.  Keep smoke detectors on every level of the home and near sleeping areas. Document Released: 06/01/2002 Document Revised: 12/11/2011 Document Reviewed: 08/31/2011 [facility] Patient Information 2015 [place], [place]. This information is not intended to replace advice given to you by your health care provider. Make sure you discuss any questions you have with your health care provider.    Preventive Care for Adults  Ages 16 years and over  Blood pressure check.** / Every 1 to 2 years.  Lipid and cholesterol check.** / Every 5 years beginning at age 67 years.  Lung cancer screening. / Every year if you are aged 7-80 years and have a 30-pack-year history of smoking and currently smoke or have quit within the past 15 years. Yearly screening is stopped once you have quit smoking for at least 15 years or develop a health problem that would prevent you from having lung cancer treatment.  Clinical breast exam.** / Every year after age 64 years.  BRCA-related cancer risk assessment.** / For women who have family members with a BRCA-related cancer (breast, ovarian, tubal, or peritoneal cancers).  Mammogram.** / Every year beginning at age 61 years and continuing for as long as you are in good health. Consult with your health care provider.  Pap test.** / Every 3 years starting at age 1 years through age 28 or 70 years with 3 consecutive normal Pap tests. Testing can be stopped between 65 and 70 years with 3 consecutive normal Pap tests and no abnormal Pap or HPV tests in the past 10 years.  HPV screening.** / Every 3 years from ages 64 years through ages 24 or 72 years with a history of 3 consecutive normal Pap tests. Testing can be stopped between 65 and 70 years with 3 consecutive normal Pap tests and no abnormal Pap or HPV tests in the past 10 years.  Fecal occult blood test (FOBT) of  stool. / Every year beginning at age 107 years and continuing until age 29 years. You may not need to do this test if you get a colonoscopy every 10 years.  Flexible sigmoidoscopy or colonoscopy.** / Every 5 years for a flexible sigmoidoscopy or every 10 years for a colonoscopy beginning at age 8 years and continuing until age 44 years.  Hepatitis C blood test.** / For all people born from [birth year] through 1963 and any individual with known risks for hepatitis C.  Osteoporosis screening.** / A one-time screening for women ages 64 years and over and women at risk for fractures or osteoporosis.  Skin self-exam. / Monthly.  Influenza vaccine. / Every year.  Tetanus, diphtheria, and acellular pertussis (Tdap/Td) vaccine.** / 1 dose of Td every 10 years.  Varicella vaccine.** / Consult your health  care provider.  Zoster vaccine.** / 1 dose for adults aged 66 years or older.  Pneumococcal 13-valent conjugate (PCV13) vaccine.** / Consult your health care provider.  Pneumococcal polysaccharide (PPSV23) vaccine.** / 1 dose for all adults aged 30 years and older.  Meningococcal vaccine.** / Consult your health care provider.  Hepatitis A vaccine.** / Consult your health care provider.  Hepatitis B vaccine.** / Consult your health care provider.  Haemophilus influenzae type b (Hib) vaccine.** / Consult your health care provider. ** Family history and personal history of risk and conditions may change your health care provider's recommendations. Document Released: 08/07/2001 Document Revised: 10/26/2013 Document Reviewed: 11/06/2010 [facility] Patient Information 2015 [place], [place]. This information is not intended to replace advice given to you by your health care provider. Make sure you discuss any questions you have with your health care provider.

## 2014-06-09 ENCOUNTER — Other Ambulatory Visit (INDEPENDENT_AMBULATORY_CARE_PROVIDER_SITE_OTHER): Payer: Medicare Other

## 2014-06-09 ENCOUNTER — Telehealth: Payer: Self-pay | Admitting: *Deleted

## 2014-06-09 DIAGNOSIS — I10 Essential (primary) hypertension: Secondary | ICD-10-CM

## 2014-06-09 LAB — COMPREHENSIVE METABOLIC PANEL
ALT: 13 U/L (ref 0–35)
AST: 21 U/L (ref 0–37)
Albumin: 4 g/dL (ref 3.5–5.2)
Alkaline Phosphatase: 76 U/L (ref 39–117)
BILIRUBIN TOTAL: 0.5 mg/dL (ref 0.2–1.2)
BUN: 10 mg/dL (ref 6–23)
CALCIUM: 8.8 mg/dL (ref 8.4–10.5)
CHLORIDE: 106 meq/L (ref 96–112)
CO2: 27 meq/L (ref 19–32)
CREATININE: 0.7 mg/dL (ref 0.50–1.10)
Glucose, Bld: 100 mg/dL — ABNORMAL HIGH (ref 70–99)
Potassium: 4.6 mEq/L (ref 3.5–5.3)
Sodium: 140 mEq/L (ref 135–145)
TOTAL PROTEIN: 6.8 g/dL (ref 6.0–8.3)

## 2014-06-09 LAB — LIPID PANEL
Cholesterol: 197 mg/dL (ref 0–200)
HDL: 70 mg/dL (ref >39)
LDL Cholesterol: 98 mg/dL (ref 0–99)
TRIGLYCERIDES: 145 mg/dL (ref <150)
Total CHOL/HDL Ratio: 2.8 Ratio
VLDL: 29 mg/dL (ref 0–40)

## 2014-06-09 LAB — TSH: TSH: 1.14 u[IU]/mL (ref 0.350–4.500)

## 2014-06-09 NOTE — Telephone Encounter (Signed)
Pt requested to go to the third floor to have labs done this morning, [facility] called today stating that there wasn't enough blood for labs , all labs need to be recollected , notified pt - pt states would like to go back to the 3rd floor to have done . Pt scheduled to 12/17 for [facility].

## 2014-06-10 ENCOUNTER — Other Ambulatory Visit (INDEPENDENT_AMBULATORY_CARE_PROVIDER_SITE_OTHER): Payer: Medicare Other

## 2014-06-10 DIAGNOSIS — E559 Vitamin D deficiency, unspecified: Secondary | ICD-10-CM | POA: Diagnosis not present

## 2014-06-10 DIAGNOSIS — I10 Essential (primary) hypertension: Secondary | ICD-10-CM | POA: Diagnosis not present

## 2014-06-10 LAB — LIPID PANEL
CHOLESTEROL: 216 mg/dL — AB (ref 0–200)
HDL: 69.5 mg/dL (ref >39.00)
LDL Cholesterol: 126 mg/dL — ABNORMAL HIGH (ref 0–99)
NONHDL: 146.5
Total CHOL/HDL Ratio: 3
Triglycerides: 104 mg/dL (ref 0.0–149.0)
VLDL: 20.8 mg/dL (ref 0.0–40.0)

## 2014-06-10 LAB — COMPREHENSIVE METABOLIC PANEL
ALT: 13 U/L (ref 0–35)
AST: 23 U/L (ref 0–37)
Albumin: 4.1 g/dL (ref 3.5–5.2)
Alkaline Phosphatase: 77 U/L (ref 39–117)
BUN: 11 mg/dL (ref 6–23)
CALCIUM: 9.2 mg/dL (ref 8.4–10.5)
CHLORIDE: 108 meq/L (ref 96–112)
CO2: 23 meq/L (ref 19–32)
Creatinine, Ser: 0.7 mg/dL (ref 0.4–1.2)
GFR: 85.69 mL/min (ref >60.00)
Glucose, Bld: 102 mg/dL — ABNORMAL HIGH (ref 70–99)
Potassium: 4.1 mEq/L (ref 3.5–5.1)
Sodium: 143 mEq/L (ref 135–145)
Total Bilirubin: 0.4 mg/dL (ref 0.2–1.2)
Total Protein: 7.2 g/dL (ref 6.0–8.3)

## 2014-06-10 LAB — TSH: TSH: 0.71 u[IU]/mL (ref 0.35–4.50)

## 2014-06-10 LAB — VITAMIN D 25 HYDROXY (VIT D DEFICIENCY, FRACTURES): VITD: 57.57 ng/mL (ref 30.00–100.00)

## 2014-06-13 ENCOUNTER — Telehealth: Payer: Self-pay | Admitting: Internal Medicine

## 2014-06-13 NOTE — Telephone Encounter (Signed)
Advise patient, labs from 06/10/2014: Cholesterol is satisfactory; the thyroid, liver, kidney test are normal, good results

## 2014-06-14 NOTE — Telephone Encounter (Signed)
Letter printed and mailed to Pt.  

## 2014-06-17 ENCOUNTER — Telehealth: Payer: Self-pay | Admitting: Internal Medicine

## 2014-06-17 NOTE — Telephone Encounter (Signed)
Results printed and mailed to Pt as requested.

## 2014-06-17 NOTE — Telephone Encounter (Signed)
Caller name:Makhloufm Skylin Relation to OM:VEHM Call back number:[phone] Pharmacy:  Reason for call: pt is requesting a copy of her lab results mailed to her. Labs were done last thursday

## 2014-07-21 DIAGNOSIS — S233XXA Sprain of ligaments of thoracic spine, initial encounter: Secondary | ICD-10-CM | POA: Diagnosis not present

## 2014-07-21 DIAGNOSIS — M9901 Segmental and somatic dysfunction of cervical region: Secondary | ICD-10-CM | POA: Diagnosis not present

## 2014-07-21 DIAGNOSIS — S134XXA Sprain of ligaments of cervical spine, initial encounter: Secondary | ICD-10-CM | POA: Diagnosis not present

## 2014-07-21 DIAGNOSIS — M9903 Segmental and somatic dysfunction of lumbar region: Secondary | ICD-10-CM | POA: Diagnosis not present

## 2014-07-21 DIAGNOSIS — S335XXA Sprain of ligaments of lumbar spine, initial encounter: Secondary | ICD-10-CM | POA: Diagnosis not present

## 2014-07-21 DIAGNOSIS — M9902 Segmental and somatic dysfunction of thoracic region: Secondary | ICD-10-CM | POA: Diagnosis not present

## 2014-07-26 DIAGNOSIS — H524 Presbyopia: Secondary | ICD-10-CM | POA: Diagnosis not present

## 2014-07-26 DIAGNOSIS — H2512 Age-related nuclear cataract, left eye: Secondary | ICD-10-CM | POA: Diagnosis not present

## 2014-08-03 ENCOUNTER — Ambulatory Visit (INDEPENDENT_AMBULATORY_CARE_PROVIDER_SITE_OTHER): Payer: Medicare Other | Admitting: Internal Medicine

## 2014-08-03 ENCOUNTER — Encounter: Payer: Self-pay | Admitting: Internal Medicine

## 2014-08-03 VITALS — BP 133/83 | HR 77 | Temp 97.9°F | Ht 61.0 in | Wt 185.5 lb

## 2014-08-03 DIAGNOSIS — N390 Urinary tract infection, site not specified: Secondary | ICD-10-CM

## 2014-08-03 DIAGNOSIS — B3749 Other urogenital candidiasis: Secondary | ICD-10-CM

## 2014-08-03 LAB — POCT URINALYSIS DIPSTICK
Bilirubin, UA: NEGATIVE
Glucose, UA: NEGATIVE
KETONES UA: NEGATIVE
Nitrite, UA: NEGATIVE
PROTEIN UA: NEGATIVE
SPEC GRAV UA: 1.01
UROBILINOGEN UA: 0.2
pH, UA: 6.5

## 2014-08-03 NOTE — Patient Instructions (Signed)
Drink plenty of fluids Okay to take OTC cranberry juice tablets  Will wait until   the urine culture come back, if your symptoms increase, you have fever chills: Call the office anytime

## 2014-08-03 NOTE — Progress Notes (Signed)
Subjective:    Patient ID: Marie Hebert, female    DOB: 05/13/1933, 81 y.o.   MRN: [id number]  DOS:  08/03/2014 Type of visit - description : acute, uti? Interval history: Symptoms started a week ago with mild vaginal itching and suprapubic pressure with urination. She started using a cream Mycolog without much relief. Thinks she has a UTI   Review of Systems  Denies fever chills No nausea vomiting She denies   actually any vaginal rash, discharge or bleeding  Past Medical History  Diagnosis Date  . Hypertension   . Palpitations   . Dizziness   . Back pain     Past Surgical History  Procedure Laterality Date  . Retinal laser procedure    . Cataract extraction  2010    right    History   Social History  . Marital Status: Widowed    Spouse Name: N/A    Number of Children: 4  . Years of Education: N/A   Occupational History  . n/a    Social History Main Topics  . Smoking status: Never Smoker   . Smokeless tobacco: Never Used  . Alcohol Use: No  . Drug Use: No  . Sexual Activity: Not on file   Other Topics Concern  . Not on file   Social History Narrative   Lives by herself          Medication List       This list is accurate as of: 08/03/14  7:13 PM.  Always use your most recent med list.               CO Q-10 PLUS PO  Take 1 tablet by mouth daily.     diltiazem 240 MG 24 hr capsule  Commonly known as:  CARDIZEM CD  Take one capsule daily.     Fish Oil 1000 MG Caps  Take 1 capsule by mouth daily.     Magnesium 500 MG Caps  Take 1 capsule by mouth daily.     nystatin-triamcinolone ointment  Commonly known as:  MYCOLOG  Apply 1 application topically 2 (two) times daily.     vitamin C 500 MG tablet  Commonly known as:  ASCORBIC ACID  Take 500 mg by mouth daily.     Vitamin D 1000 UNITS capsule  Take by mouth 2 (two) times daily.           Objective:   Physical Exam  Constitutional: She is oriented to person, place, and  time. She appears well-developed and well-nourished. No distress.  Abdominal: Soft. Bowel sounds are normal. She exhibits no distension and no mass. There is no tenderness. There is no rebound and no guarding.  No CVA tenderness  Neurological: She is alert and oriented to person, place, and time.  Skin: Skin is warm and dry. She is not diaphoretic. No pallor.  Psychiatric: She has a normal mood and affect. Her behavior is normal. Judgment and thought content normal.         Assessment & Plan:   Problem List Items Addressed This Visit      Genitourinary   Recurrent UTI    Patient suspectsa UTI, Udip showed leukocytes. Plan: Urine culture, UA, treated with antibiotics if appropriate. The patient is concerned about recurrent UTIs, I discussed the most recent urological evaluation, and they recommended topical hormones, the patient continued to be afraid of cancer despite reassurance that topical hormones are  low risk.  Other Visit Diagnoses    Candida UTI    -  Primary    Relevant Orders    POCT Urinalysis Dipstick (Completed)    Urine Culture    Urinalysis, Routine w reflex microscopic

## 2014-08-03 NOTE — Progress Notes (Signed)
Pre visit review using our clinic review tool, if applicable. No additional management support is needed unless otherwise documented below in the visit note. 

## 2014-08-03 NOTE — Assessment & Plan Note (Addendum)
Patient suspects a UTI, Udip showed leukocytes. Plan: Urine culture, UA, treated with antibiotics if appropriate. The patient is concerned about recurrent UTIs, I discussed the most recent urological evaluation,   they recommended topical hormones, the patient continued to be afraid of cancer despite reassurance that topical hormones are  low risk.

## 2014-08-09 ENCOUNTER — Telehealth: Payer: Self-pay | Admitting: Internal Medicine

## 2014-08-09 NOTE — Telephone Encounter (Signed)
Spoke with Pt, informed her that we have not received culture results back from [place]. However, in the meantime Dr. Larose Kells would send in an Abx. Pt informed me that she did not want to take medication unless she knows it's going to kill infection. Pt requested medication not be sent to pharmacy until urine culture is back.

## 2014-08-09 NOTE — Telephone Encounter (Signed)
Pt canceled appointment due to the weather

## 2014-08-09 NOTE — Telephone Encounter (Signed)
Please advise 

## 2014-08-09 NOTE — Telephone Encounter (Signed)
Caller name: Maudene, Stotler Relation to pt: self  Call back number: [phone] Pharmacy: [id number] [place], [address] Eduard Roux [phone] ([phone] (Fax)         Reason for call:  Pt is checking on the status of RX, pt states she has a bladder infection. Pt was last seen 08/03/14

## 2014-08-09 NOTE — Telephone Encounter (Signed)
UCX  is pending, call Bactrim DS 1 by mouth twice a day #60 no refills

## 2014-08-09 NOTE — Telephone Encounter (Signed)
No problem.

## 2014-08-13 ENCOUNTER — Telehealth: Payer: Self-pay | Admitting: Internal Medicine

## 2014-08-13 DIAGNOSIS — N390 Urinary tract infection, site not specified: Secondary | ICD-10-CM | POA: Diagnosis not present

## 2014-08-13 DIAGNOSIS — B3749 Other urogenital candidiasis: Secondary | ICD-10-CM | POA: Diagnosis not present

## 2014-08-13 NOTE — Telephone Encounter (Signed)
UA and urine culture was never collected , spoke with pt. Pt came by today to drop off another urine sample . UA / urine culture sent out.

## 2014-08-13 NOTE — Telephone Encounter (Signed)
I don't see the results, please call the lab

## 2014-08-13 NOTE — Telephone Encounter (Signed)
Pt called regarding urine culture results. Please advise.

## 2014-08-13 NOTE — Telephone Encounter (Signed)
Pt was seen 08/03/2014 for recurrent UTI's we have not received urine culture results from [facility]? Please advise.

## 2014-08-13 NOTE — Addendum Note (Signed)
Addended by: Peggyann Shoals on: 08/13/2014 03:28 PM   Modules accepted: Orders

## 2014-08-13 NOTE — Telephone Encounter (Signed)
Caller name: Carys Relation to pt: self Call back number: [phone] Pharmacy:  Reason for call:   Requesting last lab results.

## 2014-08-14 LAB — URINALYSIS, ROUTINE W REFLEX MICROSCOPIC
BILIRUBIN URINE: NEGATIVE
GLUCOSE, UA: NEGATIVE mg/dL
Ketones, ur: NEGATIVE mg/dL
Nitrite: NEGATIVE
Protein, ur: NEGATIVE mg/dL
SPECIFIC GRAVITY, URINE: 1.011 (ref 1.005–1.030)
Urobilinogen, UA: 0.2 mg/dL (ref 0.0–1.0)
pH: 6 (ref 5.0–8.0)

## 2014-08-14 LAB — URINALYSIS, MICROSCOPIC ONLY
Bacteria, UA: NONE SEEN
Casts: NONE SEEN
Crystals: NONE SEEN
SQUAMOUS EPITHELIAL / LPF: NONE SEEN

## 2014-08-15 LAB — URINE CULTURE: Colony Count: 10000

## 2014-08-16 ENCOUNTER — Telehealth: Payer: Self-pay | Admitting: Internal Medicine

## 2014-08-16 NOTE — Telephone Encounter (Signed)
Caller name: Marie Hebert, Verge Relation to pt: self Call back number: [phone]   Reason for call:  Pt requesting urine results please call [phone]

## 2014-08-16 NOTE — Telephone Encounter (Signed)
Urine culture inconclusive, she does not need antibiotics unless she continue having symptoms. Please let me know if that is the case

## 2014-08-16 NOTE — Telephone Encounter (Signed)
Please advise 

## 2014-08-17 NOTE — Telephone Encounter (Signed)
Spoke with Pt, informed her of urine culture results, informed her that she does not need antibiotics unless she is still having symptoms. Pt verbalized understanding and informed me that she was no longer currently having pressure when urinating.

## 2014-09-03 DIAGNOSIS — N952 Postmenopausal atrophic vaginitis: Secondary | ICD-10-CM | POA: Diagnosis not present

## 2014-09-03 DIAGNOSIS — N3020 Other chronic cystitis without hematuria: Secondary | ICD-10-CM | POA: Diagnosis not present

## 2014-09-03 DIAGNOSIS — N281 Cyst of kidney, acquired: Secondary | ICD-10-CM | POA: Diagnosis not present

## 2014-09-09 ENCOUNTER — Other Ambulatory Visit: Payer: Self-pay | Admitting: Internal Medicine

## 2014-11-09 DIAGNOSIS — H2512 Age-related nuclear cataract, left eye: Secondary | ICD-10-CM | POA: Diagnosis not present

## 2014-11-09 DIAGNOSIS — H26491 Other secondary cataract, right eye: Secondary | ICD-10-CM | POA: Diagnosis not present

## 2014-11-09 DIAGNOSIS — H524 Presbyopia: Secondary | ICD-10-CM | POA: Diagnosis not present

## 2014-11-24 DIAGNOSIS — H2512 Age-related nuclear cataract, left eye: Secondary | ICD-10-CM | POA: Diagnosis not present

## 2014-11-26 DIAGNOSIS — B301 Conjunctivitis due to adenovirus: Secondary | ICD-10-CM | POA: Diagnosis not present

## 2014-12-20 ENCOUNTER — Other Ambulatory Visit: Payer: Self-pay

## 2014-12-20 DIAGNOSIS — H2512 Age-related nuclear cataract, left eye: Secondary | ICD-10-CM | POA: Diagnosis not present

## 2014-12-20 DIAGNOSIS — H25812 Combined forms of age-related cataract, left eye: Secondary | ICD-10-CM | POA: Diagnosis not present

## 2015-03-04 ENCOUNTER — Other Ambulatory Visit: Payer: Self-pay | Admitting: Internal Medicine

## 2015-03-29 DIAGNOSIS — M9902 Segmental and somatic dysfunction of thoracic region: Secondary | ICD-10-CM | POA: Diagnosis not present

## 2015-03-29 DIAGNOSIS — S233XXA Sprain of ligaments of thoracic spine, initial encounter: Secondary | ICD-10-CM | POA: Diagnosis not present

## 2015-03-29 DIAGNOSIS — S134XXA Sprain of ligaments of cervical spine, initial encounter: Secondary | ICD-10-CM | POA: Diagnosis not present

## 2015-03-29 DIAGNOSIS — S335XXA Sprain of ligaments of lumbar spine, initial encounter: Secondary | ICD-10-CM | POA: Diagnosis not present

## 2015-03-29 DIAGNOSIS — M9903 Segmental and somatic dysfunction of lumbar region: Secondary | ICD-10-CM | POA: Diagnosis not present

## 2015-03-29 DIAGNOSIS — M9901 Segmental and somatic dysfunction of cervical region: Secondary | ICD-10-CM | POA: Diagnosis not present

## 2015-04-06 ENCOUNTER — Encounter: Payer: Self-pay | Admitting: Internal Medicine

## 2015-04-06 ENCOUNTER — Ambulatory Visit (INDEPENDENT_AMBULATORY_CARE_PROVIDER_SITE_OTHER): Payer: Medicare Other | Admitting: Internal Medicine

## 2015-04-06 VITALS — BP 116/82 | HR 55 | Temp 97.6°F | Ht 61.0 in | Wt 183.4 lb

## 2015-04-06 DIAGNOSIS — I10 Essential (primary) hypertension: Secondary | ICD-10-CM

## 2015-04-06 DIAGNOSIS — N390 Urinary tract infection, site not specified: Secondary | ICD-10-CM | POA: Diagnosis not present

## 2015-04-06 DIAGNOSIS — Z09 Encounter for follow-up examination after completed treatment for conditions other than malignant neoplasm: Secondary | ICD-10-CM

## 2015-04-06 LAB — BASIC METABOLIC PANEL
BUN: 14 mg/dL (ref 6–23)
CO2: 28 meq/L (ref 19–32)
Calcium: 9.4 mg/dL (ref 8.4–10.5)
Chloride: 103 mEq/L (ref 96–112)
Creatinine, Ser: 0.62 mg/dL (ref 0.40–1.20)
GFR: 98.37 mL/min (ref >60.00)
GLUCOSE: 93 mg/dL (ref 70–99)
POTASSIUM: 4 meq/L (ref 3.5–5.1)
Sodium: 138 mEq/L (ref 135–145)

## 2015-04-06 LAB — URINALYSIS, ROUTINE W REFLEX MICROSCOPIC
Bilirubin Urine: NEGATIVE
Hgb urine dipstick: NEGATIVE
KETONES UR: NEGATIVE
Nitrite: NEGATIVE
PH: 7 (ref 5.0–8.0)
SPECIFIC GRAVITY, URINE: 1.01 (ref 1.000–1.030)
Total Protein, Urine: NEGATIVE
UROBILINOGEN UA: 0.2 (ref 0.0–1.0)
Urine Glucose: NEGATIVE

## 2015-04-06 LAB — LIPID PANEL
Cholesterol: 194 mg/dL (ref 0–200)
HDL: 63.9 mg/dL (ref >39.00)
LDL Cholesterol: 105 mg/dL — ABNORMAL HIGH (ref 0–99)
NONHDL: 130.12
Total CHOL/HDL Ratio: 3
Triglycerides: 126 mg/dL (ref 0.0–149.0)
VLDL: 25.2 mg/dL (ref 0.0–40.0)

## 2015-04-06 NOTE — Progress Notes (Signed)
Pre visit review using our clinic review tool, if applicable. No additional management support is needed unless otherwise documented below in the visit note. 

## 2015-04-06 NOTE — Progress Notes (Signed)
Subjective:    Patient ID: Marie Hebert, female    DOB: September 07, 1932, 81 y.o.   MRN: [id number]  DOS:  04/06/2015 Type of visit - description : routine visit Interval history: In general feeling well, good compliance with medications. Likes his urine checked due to history of recurrent UTIs. Like  her cholesterol checked because the last time LDL was slightly elevated   Review of Systems very active physically, takes regular walks. Ambulatory BPs 120/70 Denies fever, chills. No lower extremity edema No dysuria, gross hematuria difficulty urinating  Past Medical History  Diagnosis Date  . Hypertension   . Palpitations   . Dizziness   . Back pain     Past Surgical History  Procedure Laterality Date  . Retinal laser procedure    . Cataract extraction  2010    right    Social History   Social History  . Marital Status: Widowed    Spouse Name: N/A  . Number of Children: 4  . Years of Education: N/A   Occupational History  . n/a    Social History Main Topics  . Smoking status: Never Smoker   . Smokeless tobacco: Never Used  . Alcohol Use: No  . Drug Use: No  . Sexual Activity: Not on file   Other Topics Concern  . Not on file   Social History Narrative   Lives by herself          Medication List       This list is accurate as of: 04/06/15  6:01 PM.  Always use your most recent med list.               CO Q-10 PLUS PO  Take 1 tablet by mouth daily.     conjugated estrogens vaginal cream  Commonly known as:  PREMARIN  Place 1 Applicatorful vaginally once a week.     diltiazem 240 MG 24 hr capsule  Commonly known as:  CARDIZEM CD  Take 1 capsule (240 mg total) by mouth daily.     Fish Oil 1000 MG Caps  Take 1 capsule by mouth daily.     Magnesium 500 MG Caps  Take 1 capsule by mouth daily.     vitamin C 500 MG tablet  Commonly known as:  ASCORBIC ACID  Take 500 mg by mouth daily.     Vitamin D 1000 UNITS capsule  Take by mouth 2  (two) times daily.           Objective:   Physical Exam BP 116/82 mmHg  Pulse 55  Temp(Src) 97.6 F (36.4 C) (Oral)  Ht 5\' 1"  (1.549 m)  Wt 183 lb 6 oz (83.178 kg)  BMI 34.67 kg/m2  SpO2 99% General:   Well developed, well nourished . NAD.  HEENT:  Normocephalic . Face symmetric, atraumatic Lungs:  CTA B Normal respiratory effort, no intercostal retractions, no accessory muscle use. Heart: RRR,  no murmur.  No pretibial edema bilaterally  Skin: Not pale. Not jaundice Neurologic:  alert & oriented X3.  Speech normal, gait appropriate for age and unassisted Psych--  Cognition and judgment appear intact.  Cooperative with normal attention span and concentration.  Behavior appropriate. No anxious or depressed appearing.      Assessment & Plan:   assessment> HTN (h/o palpitations, on cardiazem) DJD Recurrent UTIs; extensive urology w/u  2015:atrophic vaginitis, eventually decided to try hormonal creams   Plan HTN: Seems well-controlled, refill Cardizem, check a BMP Slightly elevated LDL  compared to the last time: Recheck a FLP per pt request Recommend UTIs: Urology recommended Premarin cream, she eventually started to use a few months ago,has seen no subjective difference. Currently asymptomatic but requests a UA. Will do. Primary care: Declined a flu shot,she is actually declining all  screenings. RTC in few  months for a physical exam

## 2015-04-06 NOTE — Assessment & Plan Note (Signed)
HTN: Seems well-controlled, refill Cardizem, check a BMP Slightly elevated LDL compared to the last time: Recheck a FLP per pt request Recommend UTIs: Urology recommended Premarin cream, she eventually started to use a few months ago,has seen no subjective difference. Currently asymptomatic but requests a UA. Will do. Primary care: Declined a flu shot,she is actually declining all  screenings. RTC in few  months for a physical exam

## 2015-04-06 NOTE — Patient Instructions (Signed)
Get your blood work before you leave       Next visit  for a    physical exam in 8 months, fasting.  Please schedule an appointment at the front desk

## 2015-04-08 LAB — URINE CULTURE

## 2015-04-18 DIAGNOSIS — H43813 Vitreous degeneration, bilateral: Secondary | ICD-10-CM | POA: Diagnosis not present

## 2015-04-18 DIAGNOSIS — H33322 Round hole, left eye: Secondary | ICD-10-CM | POA: Diagnosis not present

## 2015-04-18 DIAGNOSIS — Z961 Presence of intraocular lens: Secondary | ICD-10-CM | POA: Diagnosis not present

## 2015-04-19 ENCOUNTER — Telehealth: Payer: Self-pay | Admitting: Internal Medicine

## 2015-04-19 NOTE — Telephone Encounter (Signed)
Spoke with Pt, informed her of lab results. Pt requesting I print results and mail them to her. Informed her that I would.

## 2015-04-19 NOTE — Telephone Encounter (Signed)
Caller name: Arretta   Relationship to patient: Self   Can be reached: [phone]    Reason for call: pt is calling in to get results. She says that she's not sure what she's looking at on My Chart.   Please Advise.    Thanks.

## 2015-05-03 ENCOUNTER — Other Ambulatory Visit: Payer: Self-pay | Admitting: Internal Medicine

## 2015-06-22 ENCOUNTER — Encounter: Payer: Self-pay | Admitting: *Deleted

## 2015-08-03 ENCOUNTER — Ambulatory Visit: Payer: Self-pay | Admitting: Podiatry

## 2015-08-03 ENCOUNTER — Telehealth: Payer: Self-pay | Admitting: Internal Medicine

## 2015-08-03 DIAGNOSIS — L609 Nail disorder, unspecified: Secondary | ICD-10-CM

## 2015-08-03 NOTE — Telephone Encounter (Signed)
Need to know what referral is for. Thank you.

## 2015-08-03 NOTE — Telephone Encounter (Signed)
Routine check, patient states she had trouble picking up things from the floor. Please follow up with patient directly

## 2015-08-03 NOTE — Telephone Encounter (Signed)
Relation to [id number] Call back number:[phone] Pharmacy:  Reason for call:  Patient requesting a referral to [facility] Dr. Caffie Pinto

## 2015-08-09 NOTE — Telephone Encounter (Signed)
Referral placed.

## 2015-08-09 NOTE — Addendum Note (Signed)
Addended byDamita Dunnings D on: 08/09/2015 09:35 AM   Modules accepted: Orders

## 2015-08-09 NOTE — Telephone Encounter (Signed)
Trouble clipping toe nails because she cannot bend down. Pt has no problem with feet or toe nail fungus. Pt denies foot issues. She states that it is a courtesy appt and that we have to send the referral. I advised pt that we have to have a reason to refer and she was upset and states she needs it because she is old.

## 2015-08-09 NOTE — Telephone Encounter (Signed)
States her appt is 08/10/15

## 2015-08-10 ENCOUNTER — Encounter: Payer: Self-pay | Admitting: Podiatry

## 2015-08-10 ENCOUNTER — Ambulatory Visit (INDEPENDENT_AMBULATORY_CARE_PROVIDER_SITE_OTHER): Payer: Commercial Managed Care - HMO | Admitting: Podiatry

## 2015-08-10 VITALS — BP 125/70 | HR 69 | Ht 61.0 in | Wt 184.0 lb

## 2015-08-10 DIAGNOSIS — B351 Tinea unguium: Secondary | ICD-10-CM | POA: Insufficient documentation

## 2015-08-10 DIAGNOSIS — M79606 Pain in leg, unspecified: Secondary | ICD-10-CM | POA: Insufficient documentation

## 2015-08-10 NOTE — Progress Notes (Signed)
SUBJECTIVE: 82 y.o. year old female presents complaining of painful nail and unable to bend down and cut her nails.   REVIEW OF SYSTEMS: A comprehensive review of systems was negative except for: Hypertension.   OBJECTIVE: DERMATOLOGIC EXAMINATION: Nails: thick deformed nails x 10. Plantar porokeratotic lesions multiple bilateral, asymptomatic.  VASCULAR EXAMINATION OF LOWER LIMBS: Pedal pulses: All pedal pulses are palpable with normal pulsation.  Capillary Filling times within 3 seconds in all digits.  No edema or erythema noted. Temperature gradient from tibial crest to dorsum of foot is within normal bilateral.  NEUROLOGIC EXAMINATION OF THE LOWER LIMBS: All epicritic and tactile sensations grossly intact.  MUSCULOSKELETAL EXAMINATION: Rectus foot without gross deformities bilateral.   ASSESSMENT: 1. Onychomycosis x 10. 2. Pain in lower limbs.  PLAN: Debrided all nails.  Return in 3 months.

## 2015-08-10 NOTE — Patient Instructions (Signed)
Seen for hypertrophic nails. All nails debrided. Return in 3 months or as needed.  

## 2015-08-11 DIAGNOSIS — Z961 Presence of intraocular lens: Secondary | ICD-10-CM | POA: Diagnosis not present

## 2015-08-11 DIAGNOSIS — H26492 Other secondary cataract, left eye: Secondary | ICD-10-CM | POA: Diagnosis not present

## 2015-08-11 DIAGNOSIS — H43813 Vitreous degeneration, bilateral: Secondary | ICD-10-CM | POA: Diagnosis not present

## 2015-08-11 DIAGNOSIS — H33322 Round hole, left eye: Secondary | ICD-10-CM | POA: Diagnosis not present

## 2015-09-27 DIAGNOSIS — H524 Presbyopia: Secondary | ICD-10-CM | POA: Diagnosis not present

## 2015-09-27 DIAGNOSIS — H26492 Other secondary cataract, left eye: Secondary | ICD-10-CM | POA: Diagnosis not present

## 2015-10-06 DIAGNOSIS — N952 Postmenopausal atrophic vaginitis: Secondary | ICD-10-CM | POA: Diagnosis not present

## 2015-10-06 DIAGNOSIS — N281 Cyst of kidney, acquired: Secondary | ICD-10-CM | POA: Diagnosis not present

## 2015-10-06 DIAGNOSIS — N3020 Other chronic cystitis without hematuria: Secondary | ICD-10-CM | POA: Diagnosis not present

## 2015-11-08 ENCOUNTER — Ambulatory Visit: Payer: Commercial Managed Care - HMO | Admitting: Podiatry

## 2015-11-29 DIAGNOSIS — L738 Other specified follicular disorders: Secondary | ICD-10-CM | POA: Diagnosis not present

## 2015-11-29 DIAGNOSIS — D2239 Melanocytic nevi of other parts of face: Secondary | ICD-10-CM | POA: Diagnosis not present

## 2015-11-29 DIAGNOSIS — L814 Other melanin hyperpigmentation: Secondary | ICD-10-CM | POA: Diagnosis not present

## 2015-11-29 DIAGNOSIS — L821 Other seborrheic keratosis: Secondary | ICD-10-CM | POA: Diagnosis not present

## 2015-12-12 DIAGNOSIS — M5032 Other cervical disc degeneration, mid-cervical region, unspecified level: Secondary | ICD-10-CM | POA: Diagnosis not present

## 2015-12-12 DIAGNOSIS — M5137 Other intervertebral disc degeneration, lumbosacral region: Secondary | ICD-10-CM | POA: Diagnosis not present

## 2015-12-12 DIAGNOSIS — M5134 Other intervertebral disc degeneration, thoracic region: Secondary | ICD-10-CM | POA: Diagnosis not present

## 2015-12-12 DIAGNOSIS — M9901 Segmental and somatic dysfunction of cervical region: Secondary | ICD-10-CM | POA: Diagnosis not present

## 2015-12-12 DIAGNOSIS — M9902 Segmental and somatic dysfunction of thoracic region: Secondary | ICD-10-CM | POA: Diagnosis not present

## 2015-12-12 DIAGNOSIS — M9903 Segmental and somatic dysfunction of lumbar region: Secondary | ICD-10-CM | POA: Diagnosis not present

## 2015-12-14 DIAGNOSIS — M9902 Segmental and somatic dysfunction of thoracic region: Secondary | ICD-10-CM | POA: Diagnosis not present

## 2015-12-14 DIAGNOSIS — M5032 Other cervical disc degeneration, mid-cervical region, unspecified level: Secondary | ICD-10-CM | POA: Diagnosis not present

## 2015-12-14 DIAGNOSIS — M5134 Other intervertebral disc degeneration, thoracic region: Secondary | ICD-10-CM | POA: Diagnosis not present

## 2015-12-14 DIAGNOSIS — M5137 Other intervertebral disc degeneration, lumbosacral region: Secondary | ICD-10-CM | POA: Diagnosis not present

## 2015-12-14 DIAGNOSIS — M9903 Segmental and somatic dysfunction of lumbar region: Secondary | ICD-10-CM | POA: Diagnosis not present

## 2015-12-14 DIAGNOSIS — M9901 Segmental and somatic dysfunction of cervical region: Secondary | ICD-10-CM | POA: Diagnosis not present

## 2015-12-29 ENCOUNTER — Encounter: Payer: Self-pay | Admitting: Internal Medicine

## 2016-01-26 ENCOUNTER — Encounter: Payer: Self-pay | Admitting: Internal Medicine

## 2016-01-26 NOTE — Telephone Encounter (Signed)
error:[id number] ° °

## 2016-01-27 ENCOUNTER — Encounter: Payer: Self-pay | Admitting: Internal Medicine

## 2016-01-27 ENCOUNTER — Ambulatory Visit (INDEPENDENT_AMBULATORY_CARE_PROVIDER_SITE_OTHER): Payer: Commercial Managed Care - HMO | Admitting: Internal Medicine

## 2016-01-27 ENCOUNTER — Ambulatory Visit (HOSPITAL_BASED_OUTPATIENT_CLINIC_OR_DEPARTMENT_OTHER)
Admission: RE | Admit: 2016-01-27 | Discharge: 2016-01-27 | Disposition: A | Discharge status: Home / Self Care | Payer: Commercial Managed Care - HMO | Source: Ambulatory Visit | Attending: Internal Medicine | Admitting: Internal Medicine

## 2016-01-27 VITALS — BP 122/64 | HR 69 | Temp 97.8°F | Resp 12 | Ht 61.0 in | Wt 178.1 lb

## 2016-01-27 DIAGNOSIS — M79671 Pain in right foot: Secondary | ICD-10-CM

## 2016-01-27 NOTE — Patient Instructions (Signed)
Get your x-ray downstairs  Stretching before exercise  Tylenol as needed  Call if not better  Come back for a physical at your convenience

## 2016-01-27 NOTE — Progress Notes (Signed)
Pre visit review using our clinic review tool, if applicable. No additional management support is needed unless otherwise documented below in the visit note. 

## 2016-01-27 NOTE — Progress Notes (Signed)
Subjective:    Patient ID: Marie Hebert, female    DOB: 14-Nov-1932, 82 y.o.   MRN: [id number]  DOS:  01/27/2016 Type of visit - description : Acute visit Interval history: Chief complaint today is right foot pain for the last 2 or 3 weeks. The pain is proximal to the third fourth and fifth toes. She admits to some pain at the Achilles tendon distally. Denies any pain at the heel itself.    Review of Systems Denies injury but admits that she has been trying to walk more lately. No swelling, redness. Has not taken any specific medication for the pain   Past Medical History:  Diagnosis Date  . Back pain   . Dizziness   . Hypertension   . Palpitations     Past Surgical History:  Procedure Laterality Date  . CATARACT EXTRACTION  2010   right  . RETINAL LASER PROCEDURE      Social History   Social History  . Marital status: Widowed    Spouse name: N/A  . Number of children: 4  . Years of education: N/A   Occupational History  . n/a    Social History Main Topics  . Smoking status: Never Smoker  . Smokeless tobacco: Never Used  . Alcohol use No  . Drug use: No  . Sexual activity: Not on file   Other Topics Concern  . Not on file   Social History Narrative   Lives by herself          Medication List       Accurate as of 01/27/16 11:59 PM. Always use your most recent med list.          CO Q-10 PLUS PO Take 1 tablet by mouth daily.   conjugated estrogens vaginal cream Commonly known as:  PREMARIN Place 1 Applicatorful vaginally once a week.   diltiazem 240 MG 24 hr capsule Commonly known as:  CARDIZEM CD Take 1 capsule (240 mg total) by mouth daily.   Fish Oil 1000 MG Caps Take 1 capsule by mouth daily.   Magnesium 500 MG Caps Take 1 capsule by mouth daily.   vitamin C 500 MG tablet Commonly known as:  ASCORBIC ACID Take 500 mg by mouth daily.   Vitamin D 1000 units capsule Take by mouth 2 (two) times daily.          Objective:    Physical Exam BP 122/64 (BP Location: Left Arm, Patient Position: Sitting, Cuff Size: Normal)   Pulse 69   Temp 97.8 F (36.6 C) (Oral)   Resp 12   Ht 5\' 1"  (1.549 m)   Wt 178 lb 2 oz (80.8 kg)   SpO2 97%   BMI 33.66 kg/m  General:   Well developed, well nourished . NAD.  HEENT:  Normocephalic . Face symmetric, atraumatic MSK: Lower extremities without edema Good pedal pulses bilaterally Left foot normal Right foot normal to inspection - palpation.  Neurologic:  alert & oriented X3.  Speech normal, gait appropriate for age and unassisted Psych--  Cognition and judgment appear intact.  Cooperative with normal attention span and concentration.  Behavior appropriate. No anxious or depressed appearing.      Assessment & Plan:    Assessment> HTN (h/o palpitations, on cardiazem) DJD Recurrent UTIs; extensive urology w/u  2015:atrophic vaginitis, eventually decided to try hormonal creams   PLAN: Right foot pain distally: In the context of increased physical activity, she also has some Achilles tendon pain.  Recommend stretching, Tylenol, the patient request a x-ray, will do to rule out a stress fracture. Call if not better RTC at her convenience for CPX

## 2016-01-29 NOTE — Assessment & Plan Note (Signed)
Right foot pain distally: In the context of increased physical activity, she also has some Achilles tendon pain. Recommend stretching, Tylenol, the patient request a x-ray, will do to rule out a stress fracture. Call if not better RTC at her convenience for CPX

## 2016-02-09 ENCOUNTER — Telehealth: Payer: Self-pay | Admitting: Internal Medicine

## 2016-02-09 DIAGNOSIS — M549 Dorsalgia, unspecified: Secondary | ICD-10-CM

## 2016-02-09 NOTE — Telephone Encounter (Signed)
Relation to [id number] Call back [phone]   Reason for call:  Patient requesting a referral to chiropractor Dr. Loraine Maple [address], [place] [address] Phone: [phone] Fax: [phone]. Patient states she has a disc in her back all her life and now due to insurance requesting a referral. Please advise

## 2016-02-10 NOTE — Addendum Note (Signed)
Addended byDamita Dunnings D on: 02/10/2016 09:04 AM   Modules accepted: Orders

## 2016-02-10 NOTE — Telephone Encounter (Signed)
Referral placed.

## 2016-02-10 NOTE — Telephone Encounter (Signed)
Spoke w/ Pt, informed her that we are unable to place referral due to [facility] she informed me that she has just regular Medicaid and [employer]. Informed her that I would send a message to administrator for clarification since I do not work much with insurance. Pt verbalized understanding.

## 2016-02-10 NOTE — Telephone Encounter (Signed)
Please arrange

## 2016-02-10 NOTE — Telephone Encounter (Signed)
Okay to place referral

## 2016-02-10 NOTE — Telephone Encounter (Signed)
Pt has [employer], will not be able to send referral. Will try to call Pt to inform her that we do not except [place] [employer] and will need to use Medicaid provided physician.

## 2016-03-13 ENCOUNTER — Encounter: Payer: Self-pay | Admitting: Internal Medicine

## 2016-03-13 ENCOUNTER — Ambulatory Visit (INDEPENDENT_AMBULATORY_CARE_PROVIDER_SITE_OTHER): Payer: Commercial Managed Care - HMO | Admitting: Internal Medicine

## 2016-03-13 VITALS — BP 118/68 | HR 70 | Temp 97.7°F | Resp 14 | Ht 61.0 in | Wt 179.0 lb

## 2016-03-13 DIAGNOSIS — M545 Low back pain: Secondary | ICD-10-CM | POA: Diagnosis not present

## 2016-03-13 NOTE — Progress Notes (Signed)
Pre visit review using our clinic review tool, if applicable. No additional management support is needed unless otherwise documented below in the visit note. 

## 2016-03-13 NOTE — Telephone Encounter (Signed)
I have also spoke with patient and made her aware, patients neighbor called back and spoke with [facility] regarding referral. I called patient back, she did not answer. I did leave detail message, ok per DPR, that her neighbor was calling up here regarding referral and that we are unable to speak with her since she is not on DPR.

## 2016-03-13 NOTE — Progress Notes (Signed)
Subjective:    Patient ID: Marie Hebert, female    DOB: 1933-03-11, 82 y.o.   MRN: [id number]  DOS:  03/13/2016 Type of visit - description : acute Interval history: Many years history of low back pain, now needs a referral to see the chiropractor she has been seen for a while , Dr. Raynelle Chary. Also, she did some yard work few days ago and developed a bilateral upper back pain, slightly improved. Neither the   lower or upper back pain radiates.   Review of Systems  Denies upper or lower extremity paresthesia except for some hand numbness bilaterally in the morning that self resolved. No difficulty with her gait Past Medical History:  Diagnosis Date  . Back pain   . Dizziness   . Hypertension   . Palpitations     Past Surgical History:  Procedure Laterality Date  . CATARACT EXTRACTION  2010   right  . RETINAL LASER PROCEDURE      Social History   Social History  . Marital status: Widowed    Spouse name: N/A  . Number of children: 4  . Years of education: N/A   Occupational History  . n/a    Social History Main Topics  . Smoking status: Never Smoker  . Smokeless tobacco: Never Used  . Alcohol use No  . Drug use: No  . Sexual activity: Not on file   Other Topics Concern  . Not on file   Social History Narrative   Lives by herself          Medication List       Accurate as of 03/13/16  1:16 PM. Always use your most recent med list.          CO Q-10 PLUS PO Take 1 tablet by mouth daily.   conjugated estrogens vaginal cream Commonly known as:  PREMARIN Place 1 Applicatorful vaginally once a week.   diltiazem 240 MG 24 hr capsule Commonly known as:  CARDIZEM CD Take 1 capsule (240 mg total) by mouth daily.   Fish Oil 1000 MG Caps Take 1 capsule by mouth daily.   Magnesium 500 MG Caps Take 1 capsule by mouth daily.   vitamin C 500 MG tablet Commonly known as:  ASCORBIC ACID Take 500 mg by mouth daily.   Vitamin D 1000 units  capsule Take by mouth 2 (two) times daily.          Objective:   Physical Exam BP 118/68 (BP Location: Left Arm, Patient Position: Sitting, Cuff Size: Small)   Pulse 70   Temp 97.7 F (36.5 C) (Oral)   Resp 14   Ht 5\' 1"  (1.549 m)   Wt 179 lb (81.2 kg)   SpO2 97%   BMI 33.82 kg/m  General:   Well developed, well nourished . NAD.  MSK: Neck full range of motion and no TTP Lumbar spine no TTP Skin: Not pale. Not jaundice Neurologic:  alert & oriented X3.  Speech normal, gait appropriate for age and unassisted. DTRs and motor symmetric Psych--  Cognition and judgment appear intact.  Cooperative with normal attention span and concentration.  Behavior appropriate. No anxious or depressed appearing.      Assessment & Plan:   Assessment> HTN (h/o palpitations, on cardiazem) DJD Recurrent UTIs; extensive urology w/u  2015:atrophic vaginitis, eventually decided to try hormonal creams   PLAN: Chronic back pain, more recent upper back pain with normal neurological exam: Refer to Dr. Raynelle Chary per patient request.

## 2016-03-14 NOTE — Assessment & Plan Note (Signed)
Chronic back pain, more recent upper back pain with normal neurological exam: Refer to Dr. Raynelle Chary per patient request.

## 2016-03-19 ENCOUNTER — Encounter: Payer: Self-pay | Admitting: Internal Medicine

## 2016-03-19 ENCOUNTER — Ambulatory Visit (INDEPENDENT_AMBULATORY_CARE_PROVIDER_SITE_OTHER): Payer: Commercial Managed Care - HMO | Admitting: Internal Medicine

## 2016-03-19 VITALS — BP 120/64 | HR 70 | Temp 98.0°F | Resp 14 | Ht 61.0 in | Wt 178.5 lb

## 2016-03-19 DIAGNOSIS — Z0000 Encounter for general adult medical examination without abnormal findings: Secondary | ICD-10-CM | POA: Diagnosis not present

## 2016-03-19 DIAGNOSIS — E559 Vitamin D deficiency, unspecified: Secondary | ICD-10-CM

## 2016-03-19 DIAGNOSIS — I10 Essential (primary) hypertension: Secondary | ICD-10-CM

## 2016-03-19 DIAGNOSIS — M2041 Other hammer toe(s) (acquired), right foot: Secondary | ICD-10-CM

## 2016-03-19 DIAGNOSIS — Z780 Asymptomatic menopausal state: Secondary | ICD-10-CM

## 2016-03-19 MED ORDER — DILTIAZEM HCL ER COATED BEADS 180 MG PO CP24: 180.0000 mg | ORAL_CAPSULE | Freq: Every day | ORAL | 1 refills | Status: DC

## 2016-03-19 NOTE — Assessment & Plan Note (Signed)
HTN: Likes to decrease the diltiazem ER dose, I agreed to prescribe 180 mg, monitor BP, see instructions Early hammertoe deformity: Needs to see a foot doctor, will try to refer her but may have an issue related to [place] access   RTC one year

## 2016-03-19 NOTE — Progress Notes (Signed)
Pre visit review using our clinic review tool, if applicable. No additional management support is needed unless otherwise documented below in the visit note. 

## 2016-03-19 NOTE — Assessment & Plan Note (Addendum)
Td 2009;  Strongly declined all shots and screenings except for dexa (will schedule) Takes Ca-Vit D  Diet and exercise discussed although she is doing very well  Labs: BMP, CBC, FLP, vitamin D

## 2016-03-19 NOTE — Progress Notes (Signed)
Subjective:    Patient ID: Marie Hebert, female    DOB: [birth year], 82 y.o.   MRN: [id number]  DOS:  03/19/2016 Type of visit - description : cpx Interval history: No major concerns, has a healthy lifestyle, would like to decrease the dose of her BP medications. Also needs a referral to a podiatrist regards her second toe right side.   Review of Systems Constitutional: No fever. No chills. No unexplained wt changes. No unusual sweats  HEENT: No dental problems, no ear discharge, no facial swelling, no voice changes. No eye discharge, no eye  redness , no  intolerance to light   Respiratory: No wheezing , no  difficulty breathing. No cough , no mucus production  Cardiovascular: No CP, no leg swelling , no  Palpitations  GI: no nausea, no vomiting, no diarrhea , no  abdominal pain.  No blood in the stools. No dysphagia, no odynophagia    Endocrine: No polyphagia, no polyuria , no polydipsia  GU: No dysuria, gross hematuria, difficulty urinating. No urinary urgency, no frequency.  Musculoskeletal: No joint swellings or unusual aches or pains  Skin: No change in the color of the skin, palor , no  Rash  Allergic, immunologic: No environmental allergies , no  food allergies  Neurological: No dizziness no  syncope. No headaches. No diplopia, no slurred, no slurred speech, no motor deficits, no facial  Numbness  Hematological: No enlarged lymph nodes, no easy bruising , no unusual bleedings  Psychiatry: No suicidal ideas, no hallucinations, no beavior problems, no confusion.  No unusual/severe anxiety, no depression   Past Medical History:  Diagnosis Date  . Back pain   . Dizziness   . Hypertension   . Palpitations     Past Surgical History:  Procedure Laterality Date  . CATARACT EXTRACTION  2010   right  . RETINAL LASER PROCEDURE      Social History   Social History  . Marital status: Widowed    Spouse name: N/A  . Number of children: 4  . Years of  education: N/A   Occupational History  . n/a    Social History Main Topics  . Smoking status: Never Smoker  . Smokeless tobacco: Never Used  . Alcohol use No  . Drug use: No  . Sexual activity: Not on file   Other Topics Concern  . Not on file   Social History Narrative   Lives by herself       Family History  Problem Relation Age of Onset  . CAD Neg Hx   . Diabetes Neg Hx   . Colon cancer Neg Hx   . Breast cancer Neg Hx        Medication List       Accurate as of 03/19/16  3:09 PM. Always use your most recent med list.          CO Q-10 PLUS PO Take 1 tablet by mouth daily.   conjugated estrogens vaginal cream Commonly known as:  PREMARIN Place 1 Applicatorful vaginally once a week.   diltiazem 240 MG 24 hr capsule Commonly known as:  CARDIZEM CD Take 1 capsule (240 mg total) by mouth daily.   Fish Oil 1000 MG Caps Take 1 capsule by mouth daily.   Magnesium 500 MG Caps Take 1 capsule by mouth daily.   vitamin C 500 MG tablet Commonly known as:  ASCORBIC ACID Take 500 mg by mouth daily.   Vitamin D 1000 units capsule Take  by mouth 2 (two) times daily.          Objective:   Physical Exam BP 120/64 (BP Location: Left Arm, Patient Position: Sitting, Cuff Size: Normal)   Pulse 70   Temp 98 F (36.7 C) (Oral)   Resp 14   Ht 5\' 1"  (1.549 m)   Wt 178 lb 8 oz (81 kg)   SpO2 98%   BMI 33.73 kg/m   General:   Well developed, slightly overweight appearing. NAD.  Neck: No  thyromegaly  HEENT:  Normocephalic . Face symmetric, atraumatic Lungs:  CTA B Normal respiratory effort, no intercostal retractions, no accessory muscle use. Heart: RRR,  no murmur.  No pretibial edema bilaterally  Abdomen:  Not distended, soft, non-tender. No rebound or rigidity.   MSK: 2nd right toe with a early hammertoe deformity. Skin normal Skin: Exposed areas without rash. Not pale. Not jaundice Neurologic:  alert & oriented X3.  Speech normal, gait appropriate  for age and unassisted Strength symmetric and appropriate for age.  Psych: Cognition and judgment appear intact.  Cooperative with normal attention span and concentration.  Behavior appropriate. No anxious or depressed appearing.    Assessment & Plan:   Assessment> HTN (h/o palpitations, on cardiazem) DJD Recurrent UTIs; extensive urology w/u  2015:atrophic vaginitis, eventually decided to try hormonal creams   PLAN: HTN: Likes to decrease the diltiazem ER dose, I agreed to prescribe 180 mg, monitor BP, see instructions Early hammertoe deformity: Needs to see a foot doctor, will try to refer her but may have an issue related to [place] access   RTC one year

## 2016-03-19 NOTE — Patient Instructions (Signed)
GO TO THE LAB : Get the blood work    GO TO THE FRONT DESK Schedule your next appointment for a  Physical in 1 year  think about having a healthcare power of attorney   Check the  blood pressure 2 or 3 times a month   Be sure your blood pressure is between 110/65 and  145/85.  if it is consistently higher or lower, let me know     Fall Prevention and Home Safety  Falls cause injuries and can affect all age groups. It is possible to use preventive measures to significantly decrease the likelihood of falls. There are many simple measures which can make your home safer and prevent falls. OUTDOORS  Repair cracks and edges of walkways and driveways.  Remove high doorway thresholds.  Trim shrubbery on the main path into your home.  Have good outside lighting.  Clear walkways of tools, rocks, debris, and clutter.  Check that handrails are not broken and are securely fastened. Both sides of steps should have handrails.  Have leaves, snow, and ice cleared regularly.  Use sand or salt on walkways during winter months.  In the garage, clean up grease or oil spills. BATHROOM  Install night lights.  Install grab bars by the toilet and in the tub and shower.  Use non-skid mats or decals in the tub or shower.  Place a plastic non-slip stool in the shower to sit on, if needed.  Keep floors dry and clean up all water on the floor immediately.  Remove soap buildup in the tub or shower on a regular basis.  Secure bath mats with non-slip, double-sided rug tape.  Remove throw rugs and tripping hazards from the floors. BEDROOMS  Install night lights.  Make sure a bedside light is easy to reach.  Do not use oversized bedding.  Keep a telephone by your bedside.  Have a firm chair with side arms to use for getting dressed.  Remove throw rugs and tripping hazards from the floor. KITCHEN  Keep handles on pots and pans turned toward the center of the stove. Use back burners  when possible.  Clean up spills quickly and allow time for drying.  Avoid walking on wet floors.  Avoid hot utensils and knives.  Position shelves so they are not too high or low.  Place commonly used objects within easy reach.  If necessary, use a sturdy step stool with a grab bar when reaching.  Keep electrical cables out of the way.  Do not use floor polish or wax that makes floors slippery. If you must use wax, use non-skid floor wax.  Remove throw rugs and tripping hazards from the floor. STAIRWAYS  Never leave objects on stairs.  Place handrails on both sides of stairways and use them. Fix any loose handrails. Make sure handrails on both sides of the stairways are as long as the stairs.  Check carpeting to make sure it is firmly attached along stairs. Make repairs to worn or loose carpet promptly.  Avoid placing throw rugs at the top or bottom of stairways, or properly secure the rug with carpet tape to prevent slippage. Get rid of throw rugs, if possible.  Have an electrician put in a light switch at the top and bottom of the stairs. OTHER FALL PREVENTION TIPS  Wear low-heel or rubber-soled shoes that are supportive and fit well. Wear closed toe shoes.  When using a stepladder, make sure it is fully opened and both spreaders are firmly  locked. Do not climb a closed stepladder.  Add color or contrast paint or tape to grab bars and handrails in your home. Place contrasting color strips on first and last steps.  Learn and use mobility aids as needed. Install an electrical emergency response system.  Turn on lights to avoid dark areas. Replace light bulbs that burn out immediately. Get light switches that glow.  Arrange furniture to create clear pathways. Keep furniture in the same place.  Firmly attach carpet with non-skid or double-sided tape.  Eliminate uneven floor surfaces.  Select a carpet pattern that does not visually hide the edge of steps.  Be aware of  all pets. OTHER HOME SAFETY TIPS  Set the water temperature for 120 F (48.8 C).  Keep emergency numbers on or near the telephone.  Keep smoke detectors on every level of the home and near sleeping areas. Document Released: 06/01/2002 Document Revised: 12/11/2011 Document Reviewed: 08/31/2011 [facility] Patient Information 2015 [place], [place]. This information is not intended to replace advice given to you by your health care provider. Make sure you discuss any questions you have with your health care provider.   Preventive Care for Adults Ages 85 and over  Blood pressure check.** / Every 1 to 2 years.  Lipid and cholesterol check.**/ Every 5 years beginning at age 61.  Lung cancer screening. / Every year if you are aged 59-80 years and have a 30-pack-year history of smoking and currently smoke or have quit within the past 15 years. Yearly screening is stopped once you have quit smoking for at least 15 years or develop a health problem that would prevent you from having lung cancer treatment.  Fecal occult blood test (FOBT) of stool. / Every year beginning at age 22 and continuing until age 16. You may not have to do this test if you get a colonoscopy every 10 years.  Flexible sigmoidoscopy** or colonoscopy.** / Every 5 years for a flexible sigmoidoscopy or every 10 years for a colonoscopy beginning at age 69 and continuing until age 9.  Hepatitis C blood test.** / For all people born from [birth year] through 1963 and any individual with known risks for hepatitis C.  Abdominal aortic aneurysm (AAA) screening.** / A one-time screening for ages 94 to 86 years who are current or former smokers.  Skin self-exam. / Monthly.  Influenza vaccine. / Every year.  Tetanus, diphtheria, and acellular pertussis (Tdap/Td) vaccine.** / 1 dose of Td every 10 years.  Varicella vaccine.** / Consult your health care provider.  Zoster vaccine.** / 1 dose for adults aged 60 years or older.  Pneumococcal  13-valent conjugate (PCV13) vaccine.** / Consult your health care provider.  Pneumococcal polysaccharide (PPSV23) vaccine.** / 1 dose for all adults aged 6 years and older.  Meningococcal vaccine.** / Consult your health care provider.  Hepatitis A vaccine.** / Consult your health care provider.  Hepatitis B vaccine.** / Consult your health care provider.  Haemophilus influenzae type b (Hib) vaccine.** / Consult your health care provider. **Family history and personal history of risk and conditions may change your health care provider's recommendations. Document Released: 08/07/2001 Document Revised: 06/16/2013 Document Reviewed: 11/06/2010 [facility] Patient Information 2015 [place], [place]. This information is not intended to replace advice given to you by your health care provider. Make sure you discuss any questions you have with your health care provider.

## 2016-03-20 ENCOUNTER — Other Ambulatory Visit (INDEPENDENT_AMBULATORY_CARE_PROVIDER_SITE_OTHER): Payer: Commercial Managed Care - HMO

## 2016-03-20 DIAGNOSIS — I10 Essential (primary) hypertension: Secondary | ICD-10-CM

## 2016-03-20 DIAGNOSIS — E559 Vitamin D deficiency, unspecified: Secondary | ICD-10-CM

## 2016-03-20 LAB — CBC WITH DIFFERENTIAL/PLATELET
BASOS PCT: 0.4 % (ref 0.0–3.0)
Basophils Absolute: 0 10*3/uL (ref 0.0–0.1)
EOS PCT: 3.4 % (ref 0.0–5.0)
Eosinophils Absolute: 0.3 10*3/uL (ref 0.0–0.7)
HEMATOCRIT: 42.3 % (ref 36.0–46.0)
HEMOGLOBIN: 14.3 g/dL (ref 12.0–15.0)
Lymphocytes Relative: 26.7 % (ref 12.0–46.0)
Lymphs Abs: 2.3 10*3/uL (ref 0.7–4.0)
MCHC: 33.8 g/dL (ref 30.0–36.0)
MCV: 90.2 fl (ref 78.0–100.0)
MONO ABS: 0.6 10*3/uL (ref 0.1–1.0)
Monocytes Relative: 6.5 % (ref 3.0–12.0)
Neutro Abs: 5.4 10*3/uL (ref 1.4–7.7)
Neutrophils Relative %: 63 % (ref 43.0–77.0)
Platelets: 221 10*3/uL (ref 150.0–400.0)
RBC: 4.69 Mil/uL (ref 3.87–5.11)
RDW: 14.4 % (ref 11.5–15.5)
WBC: 8.6 10*3/uL (ref 4.0–10.5)

## 2016-03-20 LAB — BASIC METABOLIC PANEL
BUN: 9 mg/dL (ref 6–23)
CO2: 30 mEq/L (ref 19–32)
Calcium: 9.1 mg/dL (ref 8.4–10.5)
Chloride: 106 mEq/L (ref 96–112)
Creatinine, Ser: 0.69 mg/dL (ref 0.40–1.20)
GFR: 86.74 mL/min (ref >60.00)
Glucose, Bld: 101 mg/dL — ABNORMAL HIGH (ref 70–99)
POTASSIUM: 4.4 meq/L (ref 3.5–5.1)
SODIUM: 141 meq/L (ref 135–145)

## 2016-03-20 LAB — LIPID PANEL
CHOLESTEROL: 200 mg/dL (ref 0–200)
HDL: 77.4 mg/dL (ref >39.00)
LDL Cholesterol: 102 mg/dL — ABNORMAL HIGH (ref 0–99)
NonHDL: 123.03
Total CHOL/HDL Ratio: 3
Triglycerides: 106 mg/dL (ref 0.0–149.0)
VLDL: 21.2 mg/dL (ref 0.0–40.0)

## 2016-03-21 ENCOUNTER — Ambulatory Visit (INDEPENDENT_AMBULATORY_CARE_PROVIDER_SITE_OTHER): Payer: Commercial Managed Care - HMO | Admitting: Podiatry

## 2016-03-21 ENCOUNTER — Encounter: Payer: Self-pay | Admitting: Podiatry

## 2016-03-21 VITALS — BP 148/72 | HR 66

## 2016-03-21 DIAGNOSIS — B351 Tinea unguium: Secondary | ICD-10-CM | POA: Diagnosis not present

## 2016-03-21 DIAGNOSIS — L851 Acquired keratosis [keratoderma] palmaris et plantaris: Secondary | ICD-10-CM

## 2016-03-21 DIAGNOSIS — M21961 Unspecified acquired deformity of right lower leg: Secondary | ICD-10-CM

## 2016-03-21 DIAGNOSIS — M79671 Pain in right foot: Secondary | ICD-10-CM

## 2016-03-21 DIAGNOSIS — M21619 Bunion of unspecified foot: Secondary | ICD-10-CM

## 2016-03-21 NOTE — Patient Instructions (Signed)
Seen for pain in right foot.  Noted of right foot bunion with excess motion of the first metatarsal bone. Possible lesser digits are over working to compensate weak first metatarsal bone on right foot.  All nails debrided. Debrided plantar porokeratotic lesion right foot.  Return in 3 months or as needed.

## 2016-03-21 NOTE — Progress Notes (Signed)
SUBJECTIVE: 82 y.o. year old female presents complaining of having swollen toes with pain on 2nd, 3rd, and 4th digit right foot. Also been having pain at bunion area of the right foot as well. She was told by her Chiropractor her foot is inflamed.  Patient is wearing adhesive patches under the ball of both feet to alleviate pain from plantar callus.   REVIEW OF SYSTEMS: No new problem other than existing hypertension.  OBJECTIVE: DERMATOLOGIC EXAMINATION: Nails: thick deformed nails x 10. Plantar porokeratotic lesion symptomatic sub 3 right.   VASCULAR EXAMINATION OF LOWER LIMBS: Pedal pulses: All pedal pulses are palpable with normal pulsation.  Capillary Filling times within 3 seconds in all digits.  No edema or erythema noted. Temperature gradient from tibial crest to dorsum of foot is within normal bilateral.  NEUROLOGIC EXAMINATION OF THE LOWER LIMBS: All epicritic and tactile sensations grossly intact.  MUSCULOSKELETAL EXAMINATION: Mild bunion deformity with excess sagittal plane motion right foot. Minimum change on left foot.   ASSESSMENT: 1. Hypermobile first ray right with painful bunion deformity.  2. Abnormal lateral weight shifting and unstable right foot. 3. Flexion substitution by lesser digits (2-4) to stabilize the lateal column. 4. Symptomatic porokeratosis right sub 3. 5. Mycotic nails x 10.  6. Pain in right foot.   PLAN: Reviewed clinical findings and biomechanical reasons for swollen digits and painful bunion on right foot. Patient seem to understand the discussion. Advised to wear well supported shoes with custom orthotics. Porokeratotic lesion on right and all nails debrided.  Patient will return as needed.

## 2016-03-22 ENCOUNTER — Ambulatory Visit (HOSPITAL_BASED_OUTPATIENT_CLINIC_OR_DEPARTMENT_OTHER)
Admission: RE | Admit: 2016-03-22 | Discharge: 2016-03-22 | Disposition: A | Discharge status: Home / Self Care | Payer: Commercial Managed Care - HMO | Source: Ambulatory Visit | Attending: Internal Medicine | Admitting: Internal Medicine

## 2016-03-22 DIAGNOSIS — Z13820 Encounter for screening for osteoporosis: Secondary | ICD-10-CM | POA: Diagnosis not present

## 2016-03-22 DIAGNOSIS — Z780 Asymptomatic menopausal state: Secondary | ICD-10-CM | POA: Diagnosis not present

## 2016-03-22 DIAGNOSIS — M85851 Other specified disorders of bone density and structure, right thigh: Secondary | ICD-10-CM | POA: Diagnosis not present

## 2016-03-23 LAB — VITAMIN D 1,25 DIHYDROXY
VITAMIN D3 1, 25 (OH): 34 pg/mL
Vitamin D 1, 25 (OH)2 Total: 34 pg/mL (ref 18–72)
Vitamin D2 1, 25 (OH)2: <8 pg/mL

## 2016-03-26 ENCOUNTER — Telehealth: Payer: Self-pay | Admitting: Internal Medicine

## 2016-03-26 ENCOUNTER — Other Ambulatory Visit: Payer: Self-pay | Admitting: Internal Medicine

## 2016-03-26 NOTE — Telephone Encounter (Signed)
Pt called in, she is requesting to have her lab and bone density results be sent to her at her home address.

## 2016-03-26 NOTE — Telephone Encounter (Signed)
Please advise 

## 2016-03-26 NOTE — Telephone Encounter (Signed)
All labs were normal, we sent a letter. Also, bone density test showed osteopenia, recommend to stay active, take calcium and vitamin D supplements.

## 2016-03-26 NOTE — Telephone Encounter (Signed)
Results printed and mailed to Pt.  

## 2016-06-05 ENCOUNTER — Ambulatory Visit (INDEPENDENT_AMBULATORY_CARE_PROVIDER_SITE_OTHER): Payer: Commercial Managed Care - HMO | Admitting: Podiatry

## 2016-06-05 DIAGNOSIS — M79606 Pain in leg, unspecified: Secondary | ICD-10-CM

## 2016-06-05 DIAGNOSIS — M79673 Pain in unspecified foot: Secondary | ICD-10-CM

## 2016-06-05 DIAGNOSIS — B351 Tinea unguium: Secondary | ICD-10-CM

## 2016-06-05 NOTE — Patient Instructions (Signed)
Seen for hypertrophic nails. All nails debrided. Return in 3 months or as needed.  

## 2016-06-05 NOTE — Progress Notes (Signed)
SUBJECTIVE: 82 y.o.year old femalepresents complaining of painful toe nails.  OBJECTIVE: DERMATOLOGIC EXAMINATION: Thick deformed nails x 10. Plantar porokeratotic lesion symptomatic sub 3 right.   VASCULAR EXAMINATION OF LOWER LIMBS: All pedal pulses are palpable with normal pulsation.  Capillary Filling times within 3 seconds in all digits.  No edema or erythema noted. Temperature gradient from tibial crest to dorsum of foot is within normal bilateral.  NEUROLOGIC EXAMINATION OF THE LOWER LIMBS: All epicritic and tactile sensations grossly intact.  MUSCULOSKELETAL EXAMINATION: Mild bunion deformity with excess sagittal plane motion right foot. Minimum change on left foot.   ASSESSMENT: 1. Mycotic nails x 10.  2. Painful feet.   PLAN: Reviewed clinical findings and available treatment options. All nails debrided.  Patient will return as needed.

## 2016-06-06 ENCOUNTER — Encounter: Payer: Self-pay | Admitting: Podiatry

## 2016-06-20 ENCOUNTER — Ambulatory Visit: Payer: Commercial Managed Care - HMO | Admitting: Podiatry

## 2016-08-24 IMAGING — DX DG FOOT COMPLETE 3+V*R*
3 series · 3 of 3 positions shown · non-contrast
Comparison: None.

CLINICAL DATA: Right foot pain, no known injury, initial encounter

EXAM:
RIGHT FOOT COMPLETE - 3+ VIEW

[foot ap]
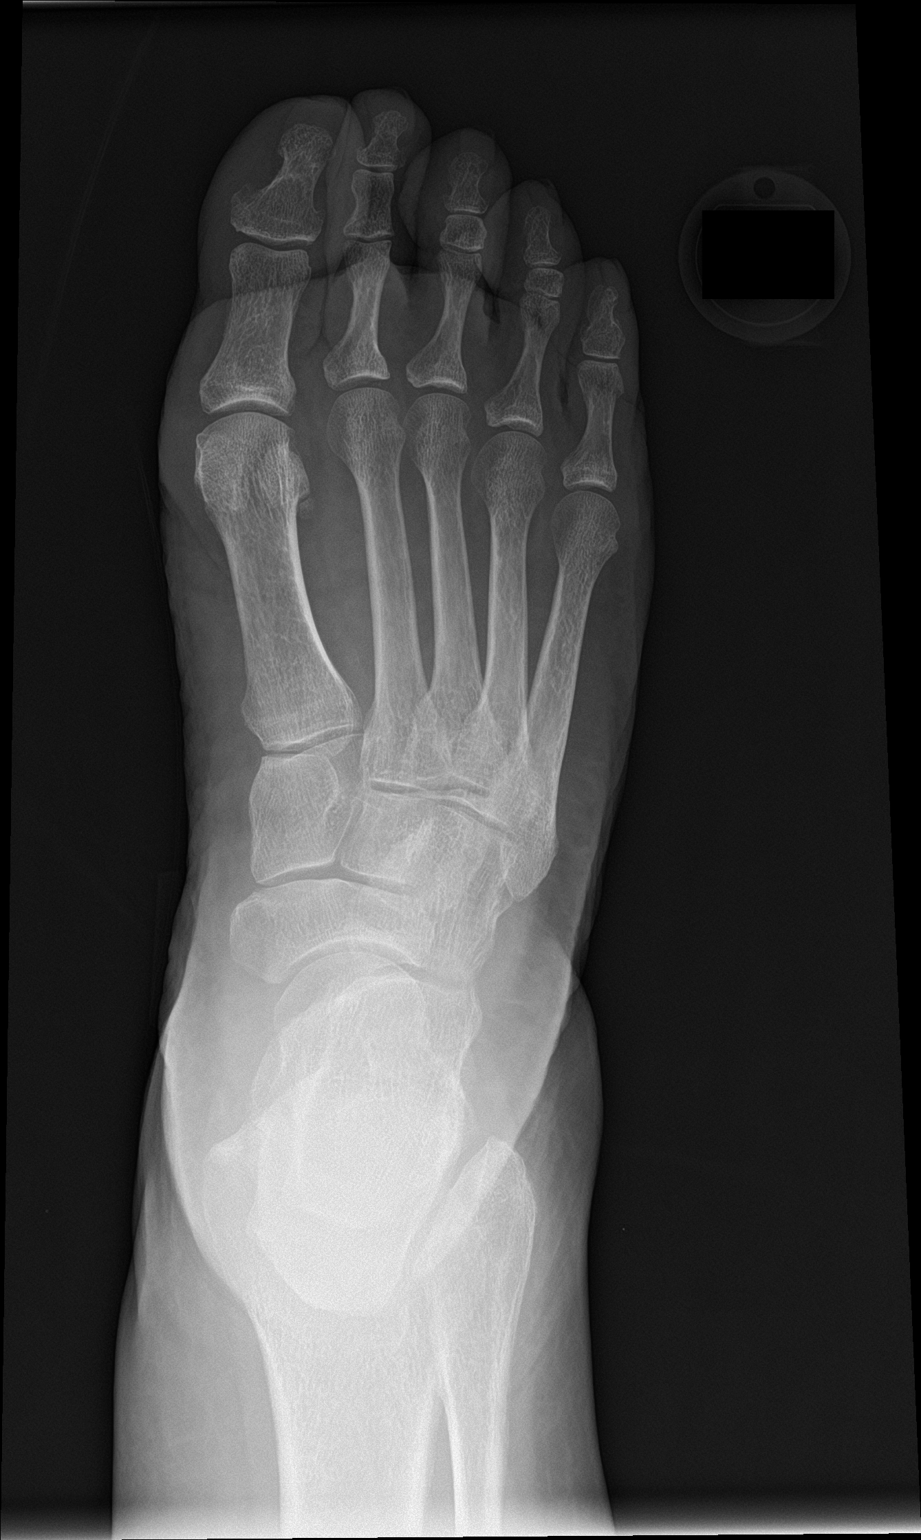

[foot obl]
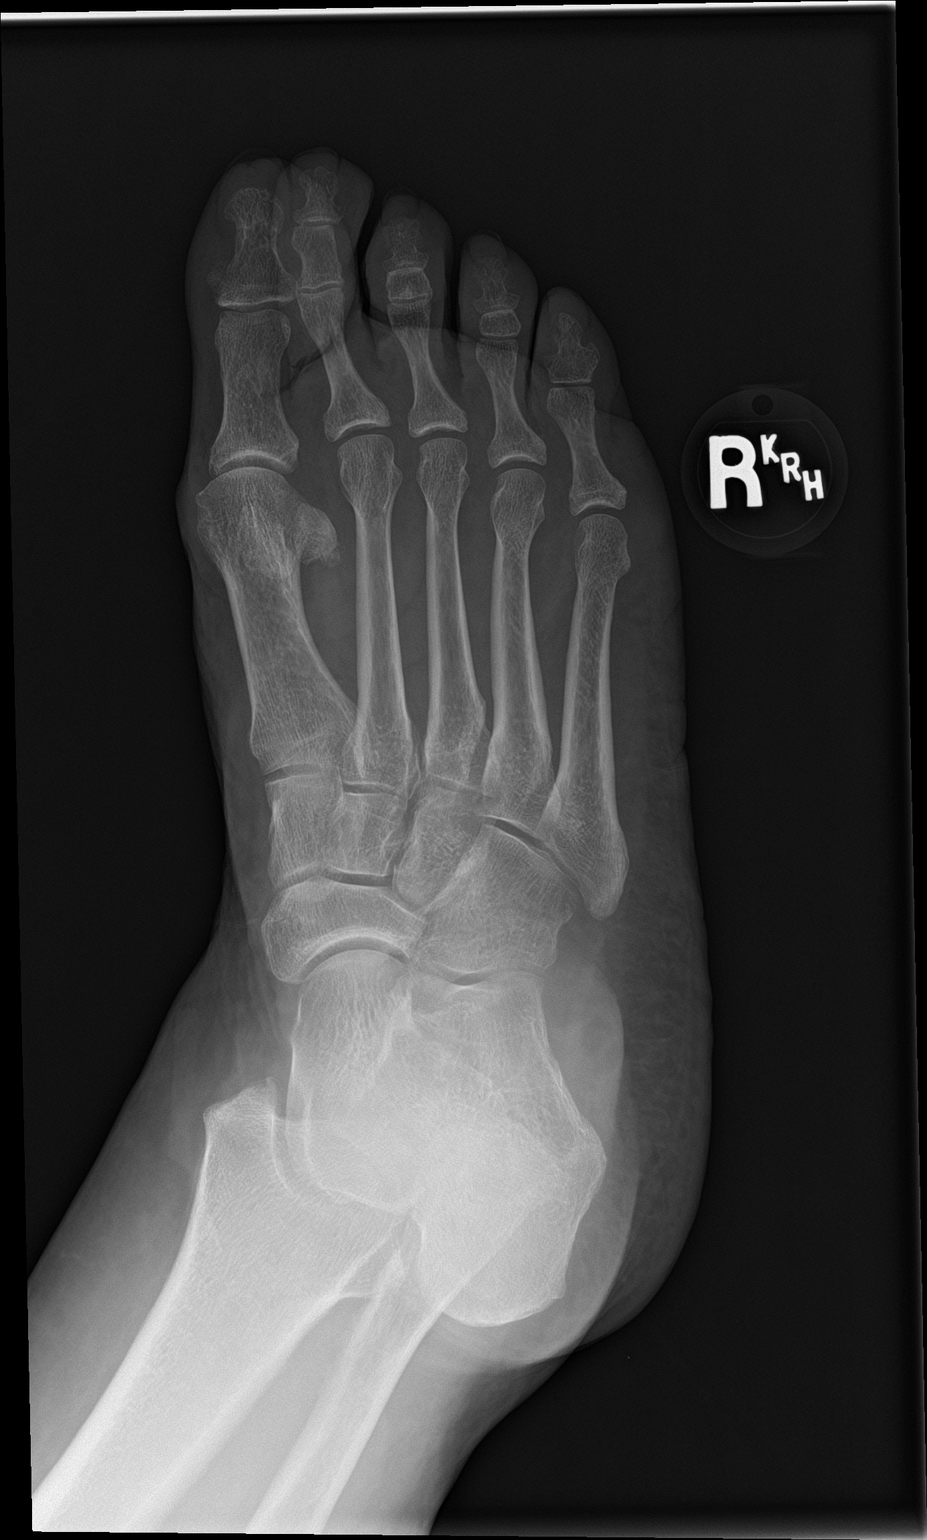

[foot lat]
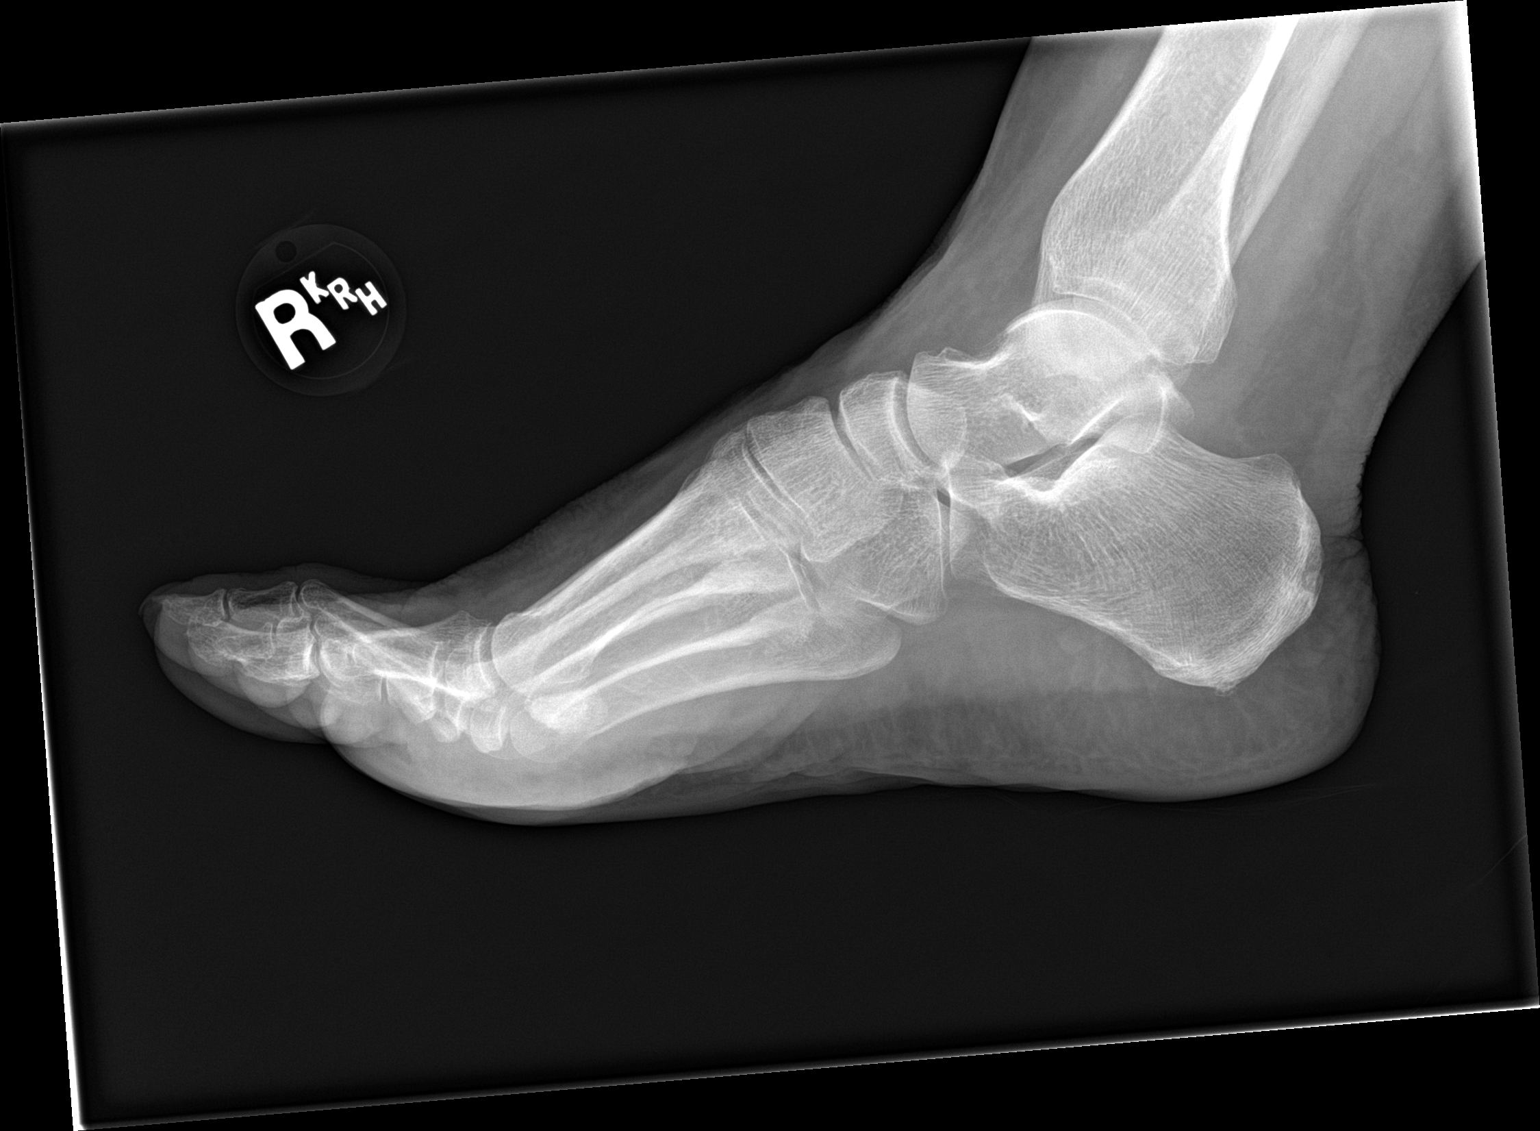

[3 of 3 positions shown; findings below may reference images not displayed]

FINDINGS: There is no evidence of fracture or dislocation. There is no
evidence of arthropathy or other focal bone abnormality. Soft
tissues are unremarkable.
IMPRESSION: No acute abnormality noted.

## 2016-09-06 DIAGNOSIS — M5032 Other cervical disc degeneration, mid-cervical region, unspecified level: Secondary | ICD-10-CM | POA: Diagnosis not present

## 2016-09-06 DIAGNOSIS — M9903 Segmental and somatic dysfunction of lumbar region: Secondary | ICD-10-CM | POA: Diagnosis not present

## 2016-09-06 DIAGNOSIS — M5134 Other intervertebral disc degeneration, thoracic region: Secondary | ICD-10-CM | POA: Diagnosis not present

## 2016-09-06 DIAGNOSIS — M9901 Segmental and somatic dysfunction of cervical region: Secondary | ICD-10-CM | POA: Diagnosis not present

## 2016-09-06 DIAGNOSIS — M9902 Segmental and somatic dysfunction of thoracic region: Secondary | ICD-10-CM | POA: Diagnosis not present

## 2016-09-16 ENCOUNTER — Other Ambulatory Visit: Payer: Self-pay | Admitting: Internal Medicine

## 2016-09-17 DIAGNOSIS — M9901 Segmental and somatic dysfunction of cervical region: Secondary | ICD-10-CM | POA: Diagnosis not present

## 2016-09-17 DIAGNOSIS — M5032 Other cervical disc degeneration, mid-cervical region, unspecified level: Secondary | ICD-10-CM | POA: Diagnosis not present

## 2016-09-17 DIAGNOSIS — M9902 Segmental and somatic dysfunction of thoracic region: Secondary | ICD-10-CM | POA: Diagnosis not present

## 2016-09-17 DIAGNOSIS — M9903 Segmental and somatic dysfunction of lumbar region: Secondary | ICD-10-CM | POA: Diagnosis not present

## 2016-09-17 DIAGNOSIS — M5134 Other intervertebral disc degeneration, thoracic region: Secondary | ICD-10-CM | POA: Diagnosis not present

## 2016-09-19 ENCOUNTER — Ambulatory Visit (INDEPENDENT_AMBULATORY_CARE_PROVIDER_SITE_OTHER): Payer: Medicare Other | Admitting: Medical

## 2016-09-19 ENCOUNTER — Encounter: Payer: Self-pay | Admitting: Medical

## 2016-09-19 VITALS — BP 149/70 | HR 87 | Temp 99.2°F | Ht 61.0 in | Wt 178.1 lb

## 2016-09-19 DIAGNOSIS — J40 Bronchitis, not specified as acute or chronic: Secondary | ICD-10-CM | POA: Diagnosis not present

## 2016-09-19 DIAGNOSIS — R059 Cough, unspecified: Secondary | ICD-10-CM

## 2016-09-19 DIAGNOSIS — R05 Cough: Secondary | ICD-10-CM

## 2016-09-19 MED ORDER — BENZONATATE 100 MG PO CAPS: 100.0000 mg | ORAL_CAPSULE | Freq: Three times a day (TID) | ORAL | 0 refills | Status: DC | PRN

## 2016-09-19 MED ORDER — AZITHROMYCIN 250 MG PO TABS: ORAL_TABLET | ORAL | 0 refills | Status: DC

## 2016-09-19 MED ORDER — FLUTICASONE PROPIONATE 50 MCG/ACT NA SUSP: 2.0000 | Freq: Every day | NASAL | 1 refills | Status: DC

## 2016-09-19 NOTE — Patient Instructions (Addendum)
You appear to have uri vs bronchitis. Rest hydrate and tylenol for fever. I am prescribing cough medicine benzonatate, and azithromycin(use if worsen as discussed). If you get nasal congestion rx flonase.  You should gradually get better. If not then notify us and would recommend a chest xray.  Follow up in 7-10 days or as needed  Decided to do flu test today since illness onset rapid and fatigue moderate to severe with low grade fever.  Flu test negative.

## 2016-09-19 NOTE — Progress Notes (Signed)
Pre visit review using our clinic review tool, if applicable. No additional management support is needed unless otherwise documented below in the visit note. 

## 2016-09-19 NOTE — Progress Notes (Signed)
Subjective:    Patient ID: Marie Hebert, female    DOB: October 15, 1932, 83 y.o.   MRN: [id number]  HPI  Pt in states yesterday she developed dry cough. No wheeze. Not productive cough. Pt felt low grade fever yesterday. She feels very tired within 24 hours. No diffuse body aches.  Pt states relative of her sat close to her for one hour and was coughing entire time.   Review of Systems  Constitutional: Positive for fatigue and fever. Negative for chills.  HENT: Negative for congestion, ear pain, facial swelling, postnasal drip, rhinorrhea and sinus pressure.   Respiratory: Positive for cough. Negative for chest tightness, shortness of breath and wheezing.   Cardiovascular: Negative for chest pain and palpitations.  Gastrointestinal: Negative for abdominal pain.  Genitourinary: Negative for dysuria.  Musculoskeletal: Negative for back pain and neck pain.  Skin: Negative for rash.  Neurological: Negative for dizziness, weakness, light-headedness and headaches.  Hematological: Negative for adenopathy. Does not bruise/bleed easily.  Psychiatric/Behavioral: Negative for behavioral problems and confusion.    Past Medical History:  Diagnosis Date  . Back pain   . Dizziness   . Hypertension   . Palpitations      Social History   Social History  . Marital status: Widowed    Spouse name: N/A  . Number of children: 4  . Years of education: N/A   Occupational History  . n/a    Social History Main Topics  . Smoking status: Never Smoker  . Smokeless tobacco: Never Used  . Alcohol use No  . Drug use: No  . Sexual activity: Not on file   Other Topics Concern  . Not on file   Social History Narrative   Lives by herself      Past Surgical History:  Procedure Laterality Date  . CATARACT EXTRACTION  2010   right  . RETINAL LASER PROCEDURE      Family History  Problem Relation Age of Onset  . CAD Neg Hx   . Diabetes Neg Hx   . Colon cancer Neg Hx   . Breast cancer  Neg Hx     Allergies  Allergen Reactions  . Aspirin   . Ibuprofen Other (See Comments)    Current Outpatient Prescriptions on File Prior to Visit  Medication Sig Dispense Refill  . Cholecalciferol (VITAMIN D) 1000 UNITS capsule Take by mouth 2 (two) times daily.     . Coenzyme Q10-Levocarnitine (CO Q-10 PLUS PO) Take 1 tablet by mouth daily.    Marland Kitchen conjugated estrogens (PREMARIN) vaginal cream Place 1 Applicatorful vaginally once a week.    . diltiazem (CARDIZEM CD) 180 MG 24 hr capsule Take 1 capsule (180 mg total) by mouth daily. 90 capsule 2  . Magnesium 500 MG CAPS Take 1 capsule by mouth daily.      . Omega-3 Fatty Acids (FISH OIL) 1000 MG CAPS Take 1 capsule by mouth daily.      . vitamin C (ASCORBIC ACID) 500 MG tablet Take 500 mg by mouth daily.     No current facility-administered medications on file prior to visit.     BP (!) 149/70 (BP Location: Left Arm, Patient Position: Sitting, Cuff Size: Large)   Pulse 87   Temp 99.2 F (37.3 C) (Oral)   Ht 5\' 1"  (1.549 m)   Wt 178 lb 2 oz (80.8 kg)   SpO2 96%   BMI 33.66 kg/m       Objective:   Physical Exam  General  Mental Status - Alert. General Appearance - Well groomed. Not in acute distress.  Skin Rashes- No Rashes.  HEENT Head- Normal. Ear Auditory Canal - Left- Normal. Right - Normal.Tympanic Membrane- Left- Normal. Right- Normal. Eye Sclera/Conjunctiva- Left- Normal. Right- Normal. Nose & Sinuses Nasal Mucosa- Left-  Boggy and Congested. Right-  Boggy and  Congested.Bilateral  No maxillary and  No frontal sinus pressure. Mouth & Throat Lips: Upper Lip- Normal: no dryness, cracking, pallor, cyanosis, or vesicular eruption. Lower Lip-Normal: no dryness, cracking, pallor, cyanosis or vesicular eruption. Buccal Mucosa- Bilateral- No Aphthous ulcers. Oropharynx- No Discharge or Erythema. Tonsils: Characteristics- Bilateral- No Erythema or Congestion. Size/Enlargement- Bilateral- No enlargement. Discharge-  bilateral-None.  Neck Neck- Supple. No Masses.   Chest and Lung Exam Auscultation: Breath Sounds:-Clear even and unlabored.  Cardiovascular Auscultation:Rythm- Regular, rate and rhythm. Murmurs & Other Heart Sounds:Ausculatation of the heart reveal- No Murmurs.  Lymphatic Head & Neck General Head & Neck Lymphatics: Bilateral: Description- No Localized lymphadenopathy.      Assessment & Plan:  You appear to have uri vs bronchitis. Rest hydrate and tylenol for fever. I am prescribing cough medicine benzonatate, and azithromycin(use if worsen as discussed). If you get nasal congestion rx flonase.  You should gradually get better. If not then notify us and would recommend a chest xray.  Follow up in 7-10 days or as needed  Decided to do flu test today since illness onset rapid and fatigue moderate to severe.  Flu test negative.  Marie Hebert, Percell Miller, PA-C

## 2016-09-24 DIAGNOSIS — M9903 Segmental and somatic dysfunction of lumbar region: Secondary | ICD-10-CM | POA: Diagnosis not present

## 2016-09-24 DIAGNOSIS — M9902 Segmental and somatic dysfunction of thoracic region: Secondary | ICD-10-CM | POA: Diagnosis not present

## 2016-09-24 DIAGNOSIS — M5032 Other cervical disc degeneration, mid-cervical region, unspecified level: Secondary | ICD-10-CM | POA: Diagnosis not present

## 2016-09-24 DIAGNOSIS — M9901 Segmental and somatic dysfunction of cervical region: Secondary | ICD-10-CM | POA: Diagnosis not present

## 2016-09-24 DIAGNOSIS — M5134 Other intervertebral disc degeneration, thoracic region: Secondary | ICD-10-CM | POA: Diagnosis not present

## 2016-10-18 ENCOUNTER — Other Ambulatory Visit: Payer: Self-pay

## 2016-10-18 MED ORDER — FLUTICASONE PROPIONATE 50 MCG/ACT NA SUSP: 2.0000 | Freq: Every day | NASAL | 1 refills | Status: DC

## 2016-10-18 MED ORDER — FLUTICASONE PROPIONATE 50 MCG/ACT NA SUSP
2.0000 | Freq: Every day | NASAL | 1 refills | Status: DC
Start: 2016-10-18 — End: 2017-01-22

## 2016-11-06 DIAGNOSIS — H04123 Dry eye syndrome of bilateral lacrimal glands: Secondary | ICD-10-CM | POA: Diagnosis not present

## 2016-11-06 DIAGNOSIS — H33322 Round hole, left eye: Secondary | ICD-10-CM | POA: Diagnosis not present

## 2017-01-09 ENCOUNTER — Encounter: Payer: Self-pay | Admitting: Podiatry

## 2017-01-09 ENCOUNTER — Ambulatory Visit (INDEPENDENT_AMBULATORY_CARE_PROVIDER_SITE_OTHER): Payer: Medicare Other | Admitting: Podiatry

## 2017-01-09 DIAGNOSIS — B351 Tinea unguium: Secondary | ICD-10-CM | POA: Diagnosis not present

## 2017-01-09 DIAGNOSIS — M79606 Pain in leg, unspecified: Secondary | ICD-10-CM | POA: Diagnosis not present

## 2017-01-09 NOTE — Patient Instructions (Addendum)
Seen for hypertrophic nails. All nails debrided. Return in 3 months or as needed.  

## 2017-01-09 NOTE — Progress Notes (Signed)
SUBJECTIVE: 83 y.o.year old femalepresents complaining of painful toe nails. She is not able to bend down and reach her feet.  OBJECTIVE: DERMATOLOGIC EXAMINATION: Thick deformed nails x 10. Plantar porokeratotic lesion symptomatic sub 3 right.   VASCULAR EXAMINATION OF LOWER LIMBS: All pedal pulses are palpable with normal pulsation.  Capillary Filling times within 3 seconds in all digits.  No edema or erythema noted. Temperature gradient from tibial crest to dorsum of foot is within normal bilateral.  NEUROLOGIC EXAMINATION OF THE LOWER LIMBS: All epicritic and tactile sensations grossly intact.  MUSCULOSKELETAL EXAMINATION: Mild bunion deformity with excess sagittal plane motion right foot. Minimum change on left foot.   ASSESSMENT: 1. Mycotic nails x 10.  2. Painful feet.   PLAN: Reviewed clinical findings and available treatment options. All nails debrided.  Patient will return as needed.

## 2017-01-22 ENCOUNTER — Encounter: Payer: Self-pay | Admitting: Internal Medicine

## 2017-01-22 ENCOUNTER — Ambulatory Visit (INDEPENDENT_AMBULATORY_CARE_PROVIDER_SITE_OTHER): Payer: Medicare Other | Admitting: Internal Medicine

## 2017-01-22 VITALS — BP 134/76 | HR 66 | Temp 98.1°F | Resp 14 | Ht 61.0 in | Wt 176.4 lb

## 2017-01-22 DIAGNOSIS — I10 Essential (primary) hypertension: Secondary | ICD-10-CM | POA: Diagnosis not present

## 2017-01-22 DIAGNOSIS — R1032 Left lower quadrant pain: Secondary | ICD-10-CM

## 2017-01-22 NOTE — Progress Notes (Signed)
Pre visit review using our clinic review tool, if applicable. No additional management support is needed unless otherwise documented below in the visit note. 

## 2017-01-22 NOTE — Progress Notes (Signed)
Subjective:    Patient ID: Marie Hebert, female    DOB: [birth year], 83 y.o.   MRN: [id number]  DOS:  01/22/2017 Type of visit - description : Acute visit Interval history: A week ago developed suddenly a left lower quadrant abdominal pain, it woke her up from sleep, did not radiate, did not decrease by taking OTCs or hermal medications. After 15 hours of almost steady pain she had a episode of nausea and vomiting and the pain went away abruptly.  The next 3 days she felt mild soreness in that area. Currently with no symptoms.   Review of Systems  No fever chills. No diarrhea or blood in the stools No rash at the area Appetite is normal. No mass or bulging hernias at the abdomen at anytime recently. No dysuria, gross hematuria difficulty urinating. No vaginal bleeding.  Past Medical History:  Diagnosis Date  . Back pain   . Dizziness   . Hypertension   . Palpitations     Past Surgical History:  Procedure Laterality Date  . CATARACT EXTRACTION  2010   right  . RETINAL LASER PROCEDURE      Social History   Social History  . Marital status: Widowed    Spouse name: N/A  . Number of children: 4  . Years of education: N/A   Occupational History  . n/a    Social History Main Topics  . Smoking status: Never Smoker  . Smokeless tobacco: Never Used  . Alcohol use No  . Drug use: No  . Sexual activity: Not on file   Other Topics Concern  . Not on file   Social History Narrative   Lives by herself        Allergies as of 01/22/2017      Reactions   Aspirin    Ibuprofen Other (See Comments)      Medication List       Accurate as of 01/22/17 11:59 PM. Always use your most recent med list.          CO Q-10 PLUS PO Take 1 tablet by mouth daily.   conjugated estrogens vaginal cream Commonly known as:  PREMARIN Place 1 Applicatorful vaginally once a week.   diltiazem 180 MG 24 hr capsule Commonly known as:  CARDIZEM CD Take 1 capsule (180 mg  total) by mouth daily.   Fish Oil 1000 MG Caps Take 1 capsule by mouth daily.   Magnesium 500 MG Caps Take 1 capsule by mouth daily.   vitamin C 500 MG tablet Commonly known as:  ASCORBIC ACID Take 500 mg by mouth daily.   Vitamin D 1000 units capsule Take by mouth 2 (two) times daily.          Objective:   Physical Exam BP 134/76 (BP Location: Left Arm, Patient Position: Sitting, Cuff Size: Small)   Pulse 66   Temp 98.1 F (36.7 C) (Oral)   Resp 14   Ht 5\' 1"  (1.549 m)   Wt 176 lb 6 oz (80 kg)   SpO2 97%   BMI 33.33 kg/m  General:   Well developed, well nourished . NAD.  HEENT:  Normocephalic . Face symmetric, atraumatic Lungs:  CTA B Normal respiratory effort, no intercostal retractions, no accessory muscle use. Heart: RRR,  no murmur.  no pretibial edema bilaterally  Abdomen:  Not distended, soft, non-tender. No rebound or rigidity.  Skin: Not pale. Not jaundice Neurologic:  alert & oriented X3.  Speech normal, gait appropriate  for age and unassisted Psych--  Cognition and judgment appear intact.  Cooperative with normal attention span and concentration.  Behavior appropriate. No anxious or depressed appearing.    Assessment & Plan:  Assessment> HTN (h/o palpitations, on cardiazem) DJD Recurrent UTIs; extensive urology w/u  2015:atrophic vaginitis, eventually decided to try hormonal creams   PLAN: LLQ  abdominal pain: As described above, now asx, sx  suggest a urethral stone. Doubt GI etiology. We discussed observation versus further eval. We agreed on the following: UA, urine culture, renal ultrasound, call anytime if sxs resurface. HTN: Seems well-controlled with Cardizem, check a BMP and CBC RTC 6 months

## 2017-01-22 NOTE — Patient Instructions (Signed)
GO TO THE LAB : Get the blood work     GO TO THE FRONT DESK Schedule your next appointment for a   checkup in 6 months 

## 2017-01-23 LAB — CBC WITH DIFFERENTIAL/PLATELET
BASOS ABS: 0.1 10*3/uL (ref 0.0–0.1)
BASOS PCT: 2 % (ref 0.0–3.0)
Eosinophils Absolute: 0.2 10*3/uL (ref 0.0–0.7)
Eosinophils Relative: 3.4 % (ref 0.0–5.0)
HEMATOCRIT: 40.6 % (ref 36.0–46.0)
Hemoglobin: 13.2 g/dL (ref 12.0–15.0)
LYMPHS PCT: 41.1 % (ref 12.0–46.0)
Lymphs Abs: 2.6 10*3/uL (ref 0.7–4.0)
MCHC: 32.5 g/dL (ref 30.0–36.0)
MCV: 93.7 fl (ref 78.0–100.0)
Monocytes Absolute: 0.5 10*3/uL (ref 0.1–1.0)
Monocytes Relative: 7.8 % (ref 3.0–12.0)
NEUTROS ABS: 2.8 10*3/uL (ref 1.4–7.7)
NEUTROS PCT: 45.7 % (ref 43.0–77.0)
Platelets: 208 10*3/uL (ref 150.0–400.0)
RBC: 4.33 Mil/uL (ref 3.87–5.11)
RDW: 14.2 % (ref 11.5–15.5)
WBC: 6.2 10*3/uL (ref 4.0–10.5)

## 2017-01-23 LAB — BASIC METABOLIC PANEL
BUN: 13 mg/dL (ref 6–23)
CALCIUM: 9.1 mg/dL (ref 8.4–10.5)
CHLORIDE: 106 meq/L (ref 96–112)
CO2: 27 meq/L (ref 19–32)
CREATININE: 0.65 mg/dL (ref 0.40–1.20)
GFR: 92.73 mL/min (ref >60.00)
Glucose, Bld: 99 mg/dL (ref 70–99)
Potassium: 4.9 mEq/L (ref 3.5–5.1)
Sodium: 141 mEq/L (ref 135–145)

## 2017-01-23 LAB — URINALYSIS, ROUTINE W REFLEX MICROSCOPIC
BILIRUBIN URINE: NEGATIVE
Hgb urine dipstick: NEGATIVE
KETONES UR: NEGATIVE
LEUKOCYTES UA: NEGATIVE
Nitrite: NEGATIVE
PH: 6 (ref 5.0–8.0)
RBC / HPF: NONE SEEN (ref 0–?)
SPECIFIC GRAVITY, URINE: 1.01 (ref 1.000–1.030)
TOTAL PROTEIN, URINE-UPE24: NEGATIVE
UROBILINOGEN UA: 0.2 (ref 0.0–1.0)
Urine Glucose: NEGATIVE

## 2017-01-23 NOTE — Assessment & Plan Note (Signed)
LLQ  abdominal pain: As described above, now asx, sx  suggest a urethral stone. Doubt GI etiology. We discussed observation versus further eval. We agreed on the following: UA, urine culture, renal ultrasound, call anytime if sxs resurface. HTN: Seems well-controlled with Cardizem, check a BMP and CBC RTC 6 months

## 2017-01-24 ENCOUNTER — Ambulatory Visit (HOSPITAL_BASED_OUTPATIENT_CLINIC_OR_DEPARTMENT_OTHER)
Admission: RE | Admit: 2017-01-24 | Discharge: 2017-01-24 | Disposition: A | Discharge status: Home / Self Care | Payer: Medicare Other | Source: Ambulatory Visit | Attending: Internal Medicine | Admitting: Internal Medicine

## 2017-01-24 DIAGNOSIS — R1032 Left lower quadrant pain: Secondary | ICD-10-CM | POA: Diagnosis present

## 2017-01-24 DIAGNOSIS — N281 Cyst of kidney, acquired: Secondary | ICD-10-CM | POA: Insufficient documentation

## 2017-01-24 DIAGNOSIS — N200 Calculus of kidney: Secondary | ICD-10-CM | POA: Diagnosis not present

## 2017-01-25 LAB — URINE CULTURE

## 2017-01-25 MED ORDER — SULFAMETHOXAZOLE-TRIMETHOPRIM 800-160 MG PO TABS
1.0000 | ORAL_TABLET | Freq: Two times a day (BID) | ORAL | 0 refills | Status: DC
Start: 2017-01-25 — End: 2017-07-29

## 2017-01-25 NOTE — Addendum Note (Signed)
Addended byDamita Dunnings D on: 01/25/2017 10:09 AM   Modules accepted: Orders

## 2017-03-21 ENCOUNTER — Encounter: Payer: Self-pay | Admitting: Internal Medicine

## 2017-03-26 ENCOUNTER — Telehealth: Payer: Self-pay | Admitting: Internal Medicine

## 2017-03-26 DIAGNOSIS — R42 Dizziness and giddiness: Secondary | ICD-10-CM

## 2017-03-26 NOTE — Telephone Encounter (Signed)
Do you know anything about ears?

## 2017-03-26 NOTE — Telephone Encounter (Signed)
Pt called requesting Dr Larose Kells refer her to ENT for her ears.

## 2017-03-26 NOTE — Telephone Encounter (Signed)
Need more information: is she having pain? Wax? Discharge? Maybe is something I can see her for here in the office

## 2017-03-26 NOTE — Telephone Encounter (Signed)
Spoke w/ Pt, she is having dizziness (nothing new per Pt), and some issues with her L ear that she would like to see ENT for. Referral placed.

## 2017-04-11 ENCOUNTER — Encounter: Payer: Self-pay | Admitting: Podiatry

## 2017-04-11 ENCOUNTER — Ambulatory Visit (INDEPENDENT_AMBULATORY_CARE_PROVIDER_SITE_OTHER): Payer: Medicare Other | Admitting: Podiatry

## 2017-04-11 DIAGNOSIS — M79671 Pain in right foot: Secondary | ICD-10-CM

## 2017-04-11 DIAGNOSIS — B351 Tinea unguium: Secondary | ICD-10-CM

## 2017-04-11 DIAGNOSIS — L600 Ingrowing nail: Secondary | ICD-10-CM

## 2017-04-11 DIAGNOSIS — M79672 Pain in left foot: Secondary | ICD-10-CM

## 2017-04-11 NOTE — Progress Notes (Signed)
Subjective: 83 y.o. year old female patient presents complaining of painful nails. Patient requests toe nails trimmed. Nails were hurting and had to take some off her self.  Denies any new problems.   Objective: Dermatologic: Thick yellow deformed nails x 10.  Pain in borders of both great toe nail. Vascular: Pedal pulses are all palpable. Orthopedic: Bunion deformity with excess sagittal plane motion of the first metatarsal right. Neurologic: All epicritic and tactile sensations grossly intact.  Assessment: Dystrophic mycotic nails x 10. Cryptotic nail both great toe. Painful nails.  Treatment: All mycotic nails debrided and grinded. Return in 3 months or as needed.

## 2017-04-11 NOTE — Patient Instructions (Signed)
Seen for hypertrophic nails. All nails debrided and grinded. Return in 3 months or as needed.

## 2017-05-28 ENCOUNTER — Ambulatory Visit: Payer: Self-pay | Admitting: *Deleted

## 2017-05-28 NOTE — Telephone Encounter (Signed)
FYI

## 2017-05-28 NOTE — Telephone Encounter (Signed)
Pt called complaining of "pulse and  heart skipping"; pt also has burping when she eats which happens at the same time she has the heart issue; this has been going on for 2 weeks; Pt request appointment with Dr Larose Kells on 05/29/17 after 1000; no appointments available with Dr Larose Kells per Jeanell Sparrow at [facility] pool; pt offered the option of going to ED or Urgent care which she immediately refuses; pt offered and accepts an appointment with Dr Nani Ravens on 05/29/17 at 1300; pt verbalizes understand; will route to [facility] pool to notify them of this upcoming appointment.  This is a pt of Dr Larose Kells and she does take diltiazem.   Reason for Disposition . Taking water pill (i.e., diuretic) or heart medication (e.g., digoxin)  Answer Assessment - Initial Assessment Questions 1. DESCRIPTION: "Please describe your heart rate or heart beat that you are having" (e.g., fast/slow, regular/irregular, skipped or extra beats, "palpitations")     Skips a beat 2. ONSET: "When did it start?" (Minutes, hours or days)      2 weeks ago  3. DURATION: "How long does it last" (e.g., seconds, minutes, hours)     seconds 4. PATTERN "Does it come and go, or has it been constant since it started?"  "Does it get worse with exertion?"   "Are you feeling it now?"     Come and goes 5. TAP: "Using your hand, can you tap out what you are feeling on a chair or table in front of you, so that I can hear?" (Note: not all patients can do this)       unable 6. HEART RATE: "Can you tell me your heart rate?" "How many beats in 15 seconds?"  (Note: not all patients can do this)       n/a 7. RECURRENT SYMPTOM: "Have you ever had this before?" If so, ask: "When was the last time?" and "What happened that time?"      yes 8. CAUSE: "What do you think is causing the palpitations?"     Maybe heart problems; this is a new problem  9. CARDIAC HISTORY: "Do you have any history of heart disease?" (e.g., heart attack, angina, bypass surgery, angioplasty,  arrhythmia)     no 10. OTHER SYMPTOMS: "Do you have any other symptoms?" (e.g., dizziness, chest pain, sweating, difficulty breathing)       Stomach feels better 11. PREGNANCY: "Is there any chance you are pregnant?" "When was your last menstrual period?"       n/a  Protocols used: HEART RATE AND HEARTBEAT QUESTIONS-A-AH

## 2017-05-28 NOTE — Telephone Encounter (Signed)
Noted  

## 2017-05-29 ENCOUNTER — Ambulatory Visit (INDEPENDENT_AMBULATORY_CARE_PROVIDER_SITE_OTHER): Payer: Medicare Other | Admitting: Family Medicine

## 2017-05-29 ENCOUNTER — Encounter: Payer: Self-pay | Admitting: Family Medicine

## 2017-05-29 VITALS — BP 142/78 | HR 77 | Temp 97.8°F | Ht 61.0 in | Wt 179.2 lb

## 2017-05-29 DIAGNOSIS — R002 Palpitations: Secondary | ICD-10-CM | POA: Diagnosis not present

## 2017-05-29 DIAGNOSIS — R1013 Epigastric pain: Secondary | ICD-10-CM | POA: Diagnosis not present

## 2017-05-29 LAB — CBC
HEMATOCRIT: 41.9 % (ref 36.0–46.0)
Hemoglobin: 13.7 g/dL (ref 12.0–15.0)
MCHC: 32.8 g/dL (ref 30.0–36.0)
MCV: 93.1 fl (ref 78.0–100.0)
Platelets: 198 10*3/uL (ref 150.0–400.0)
RBC: 4.5 Mil/uL (ref 3.87–5.11)
RDW: 13.8 % (ref 11.5–15.5)
WBC: 5.4 10*3/uL (ref 4.0–10.5)

## 2017-05-29 LAB — BASIC METABOLIC PANEL
BUN: 17 mg/dL (ref 6–23)
CALCIUM: 9.2 mg/dL (ref 8.4–10.5)
CO2: 28 mEq/L (ref 19–32)
Chloride: 105 mEq/L (ref 96–112)
Creatinine, Ser: 0.65 mg/dL (ref 0.40–1.20)
GFR: 92.65 mL/min (ref >60.00)
GLUCOSE: 156 mg/dL — AB (ref 70–99)
POTASSIUM: 4.2 meq/L (ref 3.5–5.1)
SODIUM: 141 meq/L (ref 135–145)

## 2017-05-29 LAB — TSH: TSH: 0.91 u[IU]/mL (ref 0.35–4.50)

## 2017-05-29 LAB — MAGNESIUM: MAGNESIUM: 2 mg/dL (ref 1.5–2.5)

## 2017-05-29 NOTE — Progress Notes (Signed)
Pre visit review using our clinic review tool, if applicable. No additional management support is needed unless otherwise documented below in the visit note. 

## 2017-05-29 NOTE — Progress Notes (Signed)
Chief Complaint  Patient presents with  . GI Problem  . Palpitations    Subjective: Patient is a 83 y.o. female here for palpitations.  2 weeks, usually when she lies down. She is on Cardizem. Unsure how long it lasts, generally several seconds. Unsure how often she gets, most notable when she lies down. +hx of palpitations. Denies cough, CP, SOB, lightheadedness, fevers.   Associated indigestion. Hx of H Pylori, would like to be checked again. Eating/drinking makes her belch and brings on discomfort. She is not on any medicine. No diarrhea, N/V, sick contacts.   ROS: Heart: Denies chest pain, +palpitations Lungs: Denies SOB   Family History  Problem Relation Age of Onset  . CAD Neg Hx   . Diabetes Neg Hx   . Colon cancer Neg Hx   . Breast cancer Neg Hx    Past Medical History:  Diagnosis Date  . Back pain   . Dizziness   . Hypertension   . Palpitations    Allergies  Allergen Reactions  . Aspirin   . Ibuprofen Other (See Comments)    Current Outpatient Medications:  .  Cholecalciferol (VITAMIN D) 1000 UNITS capsule, Take by mouth 2 (two) times daily. , Disp: , Rfl:  .  Coenzyme Q10-Levocarnitine (CO Q-10 PLUS PO), Take 1 tablet by mouth daily., Disp: , Rfl:  .  conjugated estrogens (PREMARIN) vaginal cream, Place 1 Applicatorful vaginally once a week., Disp: , Rfl:  .  diltiazem (CARDIZEM CD) 180 MG 24 hr capsule, Take 1 capsule (180 mg total) by mouth daily., Disp: 90 capsule, Rfl: 2 .  Magnesium 500 MG CAPS, Take 1 capsule by mouth daily.  , Disp: , Rfl:  .  Omega-3 Fatty Acids (FISH OIL) 1000 MG CAPS, Take 1 capsule by mouth daily.  , Disp: , Rfl:  .  sulfamethoxazole-trimethoprim (BACTRIM DS,SEPTRA DS) 800-160 MG tablet, Take 1 tablet by mouth 2 (two) times daily., Disp: 10 tablet, Rfl: 0 .  vitamin C (ASCORBIC ACID) 500 MG tablet, Take 500 mg by mouth daily., Disp: , Rfl:   Objective: BP (!) 142/78 (BP Location: Left Arm, Patient Position: Sitting, Cuff Size:  Normal)   Pulse 77   Temp 97.8 F (36.6 C) (Oral)   Ht 5\' 1"  (1.549 m)   Wt 179 lb 4 oz (81.3 kg)   SpO2 98%   BMI 33.87 kg/m  General: Awake, appears stated age HEENT: MMM, EOMi Heart: RRR, no bruits, no LE edema  Lungs: CTAB, no rales, wheezes or rhonchi. No accessory muscle use Abd: BS+, soft, NT, ND, no masses or organomegaly Psych: Age appropriate judgment and insight, normal affect and mood  Assessment and Plan: Palpitations - Plan: CBC, Basic metabolic panel, Magnesium, TSH, EKG 12-Lead, Ambulatory referral to Cardiology  Abdominal discomfort, epigastric - Plan: H. pylori breath test  Orders as above. Refer to cards per patient request. PAC's as noted, but pt states she was not currently experiencing palpitations. No concerns for ischemia at this time. F/u with Dr Larose Kells if GI symptoms fail to resolve. The patient voiced understanding and agreement to the plan.  Box, DO 05/29/17  1:31 PM

## 2017-05-29 NOTE — Patient Instructions (Addendum)
If you do not hear anything about your referral in the next 1-2 weeks, call our office and ask for an update.  Give [place] 2-3 business days to get the results of your labs back. If labs are normal, you will likely receive a letter in the mail unless you have MyChart. This can take longer than 2-3 business days.   Continue to walk and stay well hydrated.   Let us know if you need anything.

## 2017-05-30 LAB — H. PYLORI BREATH TEST: H. pylori Breath Test: NOT DETECTED

## 2017-05-31 ENCOUNTER — Ambulatory Visit: Payer: Self-pay | Admitting: Internal Medicine

## 2017-06-07 ENCOUNTER — Ambulatory Visit: Payer: Self-pay | Admitting: Cardiology

## 2017-06-13 ENCOUNTER — Encounter: Payer: Self-pay | Admitting: Cardiology

## 2017-06-13 ENCOUNTER — Ambulatory Visit (INDEPENDENT_AMBULATORY_CARE_PROVIDER_SITE_OTHER): Payer: Medicare Other | Admitting: Cardiology

## 2017-06-13 VITALS — BP 112/64 | HR 66 | Ht 61.0 in | Wt 178.0 lb

## 2017-06-13 DIAGNOSIS — R0789 Other chest pain: Secondary | ICD-10-CM | POA: Insufficient documentation

## 2017-06-13 DIAGNOSIS — I10 Essential (primary) hypertension: Secondary | ICD-10-CM | POA: Diagnosis not present

## 2017-06-13 DIAGNOSIS — R002 Palpitations: Secondary | ICD-10-CM

## 2017-06-13 NOTE — Patient Instructions (Addendum)
Medication Instructions:  Your physician recommends that you continue on your current medications as directed. Please refer to the Current Medication list given to you today.  Labwork: None  Testing/Procedures: Your physician has requested that you have a stress echocardiogram. For further information please visit [email or web address]. Please follow instruction sheet as given.  Your physician has recommended that you wear a holter monitor. Holter monitors are medical devices that record the heart's electrical activity. Doctors most often use these monitors to diagnose arrhythmias. Arrhythmias are problems with the speed or rhythm of the heartbeat. The monitor is a small, portable device. You can wear one while you do your normal daily activities. This is usually used to diagnose what is causing palpitations/syncope (passing out).  Your physician has requested that you have an echocardiogram. Echocardiography is a painless test that uses sound waves to create images of your heart. It provides your doctor with information about the size and shape of your heart and how well your heart's chambers and valves are working. This procedure takes approximately one hour. There are no restrictions for this procedure.   Follow-Up: Your physician recommends that you schedule a follow-up appointment in: 1 mon   Any Other Special Instructions Will Be Listed Below (If Applicable).     If you need a refill on your cardiac medications before your next appointment, please call your pharmacy.   [place], RN, BSN     Echocardiogram An echocardiogram, or echocardiography, uses sound waves (ultrasound) to produce an image of your heart. The echocardiogram is simple, painless, obtained within a short period of time, and offers valuable information to your health care provider. The images from an echocardiogram can provide information such as:  Evidence of coronary artery disease  (CAD).  Heart size.  Heart muscle function.  Heart valve function.  Aneurysm detection.  Evidence of a past heart attack.  Fluid buildup around the heart.  Heart muscle thickening.  Assess heart valve function.  Tell a health care provider about:  Any allergies you have.  All medicines you are taking, including vitamins, herbs, eye drops, creams, and over-the-counter medicines.  Any problems you or family members have had with anesthetic medicines.  Any blood disorders you have.  Any surgeries you have had.  Any medical conditions you have.  Whether you are pregnant or may be pregnant. What happens before the procedure? No special preparation is needed. Eat and drink normally. What happens during the procedure?  In order to produce an image of your heart, gel will be applied to your chest and a wand-like tool (transducer) will be moved over your chest. The gel will help transmit the sound waves from the transducer. The sound waves will harmlessly bounce off your heart to allow the heart images to be captured in real-time motion. These images will then be recorded.  You may need an IV to receive a medicine that improves the quality of the pictures. What happens after the procedure? You may return to your normal schedule including diet, activities, and medicines, unless your health care provider tells you otherwise. This information is not intended to replace advice given to you by your health care provider. Make sure you discuss any questions you have with your health care provider. Document Released: 06/08/2000 Document Revised: 01/28/2016 Document Reviewed: 02/16/2013 Elsevier Interactive Patient Education  2017 [employer].  Holter Monitoring A Holter monitor is a small device that is used to detect abnormal heart rhythms. It clips to your  clothing and is connected by wires to flat, sticky disks (electrodes) that attach to your chest. It is worn continuously for  24-48 hours. Follow these instructions at home:  Wear your Holter monitor at all times, even while exercising and sleeping, for as long as directed by your health care provider.  Make sure that the Holter monitor is safely clipped to your clothing or close to your body as recommended by your health care provider.  Do not get the monitor or wires wet.  Do not put body lotion or moisturizer on your chest.  Keep your skin clean.  Keep a diary of your daily activities, such as walking and doing chores. If you feel that your heartbeat is abnormal or that your heart is fluttering or skipping a beat: ? Record what you are doing when it happens. ? Record what time of day the symptoms occur.  Return your Holter monitor as directed by your health care provider.  Keep all follow-up visits as directed by your health care provider. This is important. Get help right away if:  You feel lightheaded or you faint.  You have trouble breathing.  You feel pain in your chest, upper arm, or jaw.  You feel sick to your stomach and your skin is pale, cool, or damp.  You heartbeat feels unusual or abnormal. This information is not intended to replace advice given to you by your health care provider. Make sure you discuss any questions you have with your health care provider. Document Released: 03/09/2004 Document Revised: 11/17/2015 Document Reviewed: 01/18/2014 Elsevier Interactive Patient Education  [employer].

## 2017-06-13 NOTE — Progress Notes (Signed)
Cardiology Office Note:    Date:  06/13/2017   ID:  Marie Hebert, DOB 1932/11/08, MRN [id number]  PCP:  Colon Branch, MD  Cardiologist:  Jenean Lindau, MD   Referring MD: Colon Branch, MD    ASSESSMENT:    1. Essential hypertension   2. PALPITATIONS, OCCASIONAL    PLAN:    In order of problems listed above:  1. Primary prevention stressed with the patient.  Importance of compliance with diet and medications stressed and she vocalized understanding. 2. Her blood pressure is stable.  Lipids are followed by her primary care physician.  Diet was discussed for weight reduction.  Risks of obesity city expressed to the patient. 3. In view of murmur heard on auscultation she will undergo echocardiogram testing. 4. She will undergo exercise stress Cardiolite to assess chest discomfort symptoms are very atypical in nature because of her risk factors for coronary artery disease. 5. In view of her palpitations, she will undergo 48-hour Holter monitoring to understand her symptoms. 6. Follow-up appointment in 1 month or earlier if she has any concerns.   Medication Adjustments/Labs and Tests Ordered: Current medicines are reviewed at length with the patient today.  Concerns regarding medicines are outlined above.  No orders of the defined types were placed in this encounter.  No orders of the defined types were placed in this encounter.    History of Present Illness:    Marie Hebert is a 83 y.o. female who is being seen today for the evaluation of palpitations and chest discomfort at the request of Colon Branch, MD.  Patient is a pleasant 83 year old female.  She has past medical history of essential hypertension.  She is here because she is concerned about skipped beats.  She is a very intelligent lady and lives by herself.  She mentions to me that she feels skipped beats and at times palpitations.  No orthopnea or PND.  She has not had any passing out episode or any such  problems.  She occasionally has chest discomfort which she describes very vaguely.  No radiation of the symptoms no neck or to the arms.  They do not occur with exertion.  At the time of my evaluation, the patient is alert awake oriented and in no distress.  She is overweight.  Past Medical History:  Diagnosis Date  . Back pain   . Dizziness   . Hypertension   . Palpitations     Past Surgical History:  Procedure Laterality Date  . CATARACT EXTRACTION  2010   right  . RETINAL LASER PROCEDURE      Current Medications: Current Meds  Medication Sig  . Cholecalciferol (VITAMIN D) 1000 UNITS capsule Take by mouth 2 (two) times daily.   . Coenzyme Q10 100 MG capsule Take by mouth.  . diltiazem (CARDIZEM CD) 180 MG 24 hr capsule Take 1 capsule (180 mg total) by mouth daily.  Marland Kitchen ibuprofen (ADVIL,MOTRIN) 400 MG tablet Take 400 mg by mouth.  . Magnesium 500 MG CAPS Take 1 capsule by mouth daily.    . Multiple Vitamin (THERA) TABS Take by mouth.  . Omega-3 Fatty Acids (FISH OIL) 1000 MG CAPS Take 1 capsule by mouth daily.    . vitamin C (ASCORBIC ACID) 500 MG tablet Take 500 mg by mouth daily.     Allergies:   Aspirin and Ibuprofen   Social History   Socioeconomic History  . Marital status: Widowed    Spouse name:  None  . Number of children: 4  . Years of education: None  . Highest education level: None  Social Needs  . Financial resource strain: None  . Food insecurity - worry: None  . Food insecurity - inability: None  . Transportation needs - medical: None  . Transportation needs - non-medical: None  Occupational History  . Occupation: n/a  Tobacco Use  . Smoking status: Never Smoker  . Smokeless tobacco: Never Used  Substance and Sexual Activity  . Alcohol use: No    Alcohol/week: 0.0 oz  . Drug use: No  . Sexual activity: None  Other Topics Concern  . None  Social History Narrative   Lives by herself       Family History: The patient's family history is negative  for CAD, Diabetes, Colon cancer, and Breast cancer.  ROS:   Please see the history of present illness.    All other systems reviewed and are negative.  EKGs/Labs/Other Studies Reviewed:    The following studies were reviewed today: Reviewed records from primary care physician's office including EKG at extensive length and discussed the findings with her.  She has sinus rhythm with multiple PACs.   Recent Labs: 05/29/2017: BUN 17; Creatinine, Ser 0.65; Hemoglobin 13.7; Magnesium 2.0; Platelets 198.0; Potassium 4.2; Sodium 141; TSH 0.91  Recent Lipid Panel    Component Value Date/Time   CHOL 200 03/20/2016 1019   TRIG 106.0 03/20/2016 1019   HDL 77.40 03/20/2016 1019   CHOLHDL 3 03/20/2016 1019   VLDL 21.2 03/20/2016 1019   LDLCALC 102 (H) 03/20/2016 1019   LDLDIRECT 115.1 12/31/2011 0905    Physical Exam:    VS:  BP 112/64 (BP Location: Left Arm, Patient Position: Sitting)   Pulse 66   Ht 5\' 1"  (1.549 m)   Wt 178 lb (80.7 kg)   SpO2 98%   BMI 33.63 kg/m     Wt Readings from Last 3 Encounters:  06/13/17 178 lb (80.7 kg)  05/29/17 179 lb 4 oz (81.3 kg)  01/22/17 176 lb 6 oz (80 kg)     GEN: Patient is in no acute distress HEENT: Normal NECK: No JVD; No carotid bruits LYMPHATICS: No lymphadenopathy CARDIAC: S1 S2 regular, 2/6 systolic murmur at the apex. RESPIRATORY:  Clear to auscultation without rales, wheezing or rhonchi  ABDOMEN: Soft, non-tender, non-distended MUSCULOSKELETAL:  No edema; No deformity  SKIN: Warm and dry NEUROLOGIC:  Alert and oriented x 3 PSYCHIATRIC:  Normal affect    Signed, Jenean Lindau, MD  06/13/2017 4:31 PM    [facility]

## 2017-06-13 NOTE — Addendum Note (Signed)
Addended by: Mattie Marlin on: 06/13/2017 04:45 PM   Modules accepted: Orders

## 2017-06-14 ENCOUNTER — Other Ambulatory Visit: Payer: Self-pay | Admitting: Internal Medicine

## 2017-07-08 ENCOUNTER — Ambulatory Visit (HOSPITAL_BASED_OUTPATIENT_CLINIC_OR_DEPARTMENT_OTHER)
Admission: RE | Admit: 2017-07-08 | Discharge: 2017-07-08 | Disposition: A | Discharge status: Home / Self Care | Payer: Medicare Other | Source: Ambulatory Visit | Attending: Cardiology | Admitting: Cardiology

## 2017-07-08 DIAGNOSIS — I420 Dilated cardiomyopathy: Secondary | ICD-10-CM | POA: Diagnosis not present

## 2017-07-08 DIAGNOSIS — R0789 Other chest pain: Secondary | ICD-10-CM | POA: Diagnosis not present

## 2017-07-08 DIAGNOSIS — I051 Rheumatic mitral insufficiency: Secondary | ICD-10-CM | POA: Insufficient documentation

## 2017-07-08 DIAGNOSIS — R002 Palpitations: Secondary | ICD-10-CM | POA: Diagnosis not present

## 2017-07-08 NOTE — Progress Notes (Signed)
  Echocardiogram 2D Echocardiogram has been performed.  Joelene Millin 07/08/2017, 1:59 PM

## 2017-07-09 ENCOUNTER — Ambulatory Visit: Payer: Medicare Other

## 2017-07-09 ENCOUNTER — Ambulatory Visit (HOSPITAL_BASED_OUTPATIENT_CLINIC_OR_DEPARTMENT_OTHER)
Admission: RE | Admit: 2017-07-09 | Discharge: 2017-07-09 | Disposition: A | Discharge status: Home / Self Care | Payer: Medicare Other | Source: Ambulatory Visit | Attending: Cardiology | Admitting: Cardiology

## 2017-07-09 DIAGNOSIS — R0789 Other chest pain: Secondary | ICD-10-CM | POA: Insufficient documentation

## 2017-07-09 DIAGNOSIS — R002 Palpitations: Secondary | ICD-10-CM | POA: Insufficient documentation

## 2017-07-09 NOTE — Progress Notes (Signed)
Echocardiogram Echocardiogram Stress Test has been performed.  Marie Hebert 07/09/2017, 3:09 PM

## 2017-07-11 ENCOUNTER — Ambulatory Visit: Payer: Medicare Other | Admitting: Podiatry

## 2017-07-11 ENCOUNTER — Ambulatory Visit (INDEPENDENT_AMBULATORY_CARE_PROVIDER_SITE_OTHER): Payer: Medicare Other | Admitting: Cardiology

## 2017-07-11 ENCOUNTER — Encounter: Payer: Self-pay | Admitting: Cardiology

## 2017-07-11 DIAGNOSIS — I340 Nonrheumatic mitral (valve) insufficiency: Secondary | ICD-10-CM

## 2017-07-11 DIAGNOSIS — I10 Essential (primary) hypertension: Secondary | ICD-10-CM

## 2017-07-11 DIAGNOSIS — I491 Atrial premature depolarization: Secondary | ICD-10-CM | POA: Diagnosis not present

## 2017-07-11 NOTE — Patient Instructions (Signed)
Medication Instructions:  Your physician recommends that you continue on your current medications as directed. Please refer to the Current Medication list given to you today.  Labwork: None  Testing/Procedures: None  Follow-Up: Your physician recommends that you schedule a follow-up appointment in: 6 months  Any Other Special Instructions Will Be Listed Below (If Applicable).     If you need a refill on your cardiac medications before your next appointment, please call your pharmacy.   [facility]  Luis Nickles A, RN, BSN  

## 2017-07-12 ENCOUNTER — Encounter: Payer: Self-pay | Admitting: Cardiology

## 2017-07-12 DIAGNOSIS — I340 Nonrheumatic mitral (valve) insufficiency: Secondary | ICD-10-CM | POA: Insufficient documentation

## 2017-07-12 DIAGNOSIS — I491 Atrial premature depolarization: Secondary | ICD-10-CM | POA: Insufficient documentation

## 2017-07-12 DIAGNOSIS — I10 Essential (primary) hypertension: Secondary | ICD-10-CM | POA: Insufficient documentation

## 2017-07-12 NOTE — Progress Notes (Signed)
Cardiology Office Note:    Date:  07/12/2017   ID:  Marie Hebert, DOB 12/16/1932, MRN [id number]  PCP:  Colon Branch, MD  Cardiologist:  Jenean Lindau, MD   Referring MD: Colon Branch, MD    ASSESSMENT:    1. Essential hypertension   2. PAC (premature atrial contraction)   3. Mitral valve insufficiency, unspecified etiology    PLAN:    In order of problems listed above:  1. Her blood pressure is stable.  Mitral valve regurgitation issues were discussed.  Importance of compliance with diet medications and regular exercise stressed and weight reduction was stressed.  Her PACs are not of much significance at this time.  Her Holter monitoring revealed this.  I discussed this with her at length we will monitor her mitral regurgitation.Patient will be seen in follow-up appointment in 6 months or earlier if the patient has any concerns    Medication Adjustments/Labs and Tests Ordered: Current medicines are reviewed at length with the patient today.  Concerns regarding medicines are outlined above.  No orders of the defined types were placed in this encounter.  No orders of the defined types were placed in this encounter.    No chief complaint on file.    History of Present Illness:    Marie Hebert is a 84 y.o. female.  The patient was evaluated for essential hypertension and shortness of breath and palpitations.  She denies any problems at this time and takes care of activities of daily living.  No chest pain orthopnea or PND.  She tells me that after she has received the results of the test she is feeling much better and she walks about a mile to mile and a half on a regular basis.  At the time of my evaluation, the patient is alert awake oriented and in no distress.  Past Medical History:  Diagnosis Date  . Back pain   . Dizziness   . Hypertension   . Palpitations     Past Surgical History:  Procedure Laterality Date  . CATARACT EXTRACTION  2010   right  .  RETINAL LASER PROCEDURE      Current Medications: No outpatient medications have been marked as taking for the 07/11/17 encounter (Office Visit) with Sheyann Sulton, Reita Cliche, MD.     Allergies:   Aspirin and Ibuprofen   Social History   Socioeconomic History  . Marital status: Widowed    Spouse name: None  . Number of children: 4  . Years of education: None  . Highest education level: None  Social Needs  . Financial resource strain: None  . Food insecurity - worry: None  . Food insecurity - inability: None  . Transportation needs - medical: None  . Transportation needs - non-medical: None  Occupational History  . Occupation: n/a  Tobacco Use  . Smoking status: Never Smoker  . Smokeless tobacco: Never Used  Substance and Sexual Activity  . Alcohol use: No    Alcohol/week: 0.0 oz  . Drug use: No  . Sexual activity: None  Other Topics Concern  . None  Social History Narrative   Lives by herself       Family History: The patient's family history includes Cancer in her father. There is no history of CAD, Colon cancer, or Breast cancer.  ROS:   Please see the history of present illness.    All other systems reviewed and are negative.  EKGs/Labs/Other Studies Reviewed:  The following studies were reviewed today: I discussed the results of the echocardiogram and stress test with the patient at extensive length.  She had multiple questions which were answered to her satisfaction.   Recent Labs: 05/29/2017: BUN 17; Creatinine, Ser 0.65; Hemoglobin 13.7; Magnesium 2.0; Platelets 198.0; Potassium 4.2; Sodium 141; TSH 0.91  Recent Lipid Panel    Component Value Date/Time   CHOL 200 03/20/2016 1019   TRIG 106.0 03/20/2016 1019   HDL 77.40 03/20/2016 1019   CHOLHDL 3 03/20/2016 1019   VLDL 21.2 03/20/2016 1019   LDLCALC 102 (H) 03/20/2016 1019   LDLDIRECT 115.1 12/31/2011 0905    Physical Exam:    VS:  BP 122/74 (BP Location: Right Arm, Patient Position: Sitting, Cuff  Size: Large)   Pulse 71   Ht 5\' 1"  (1.549 m)   Wt 179 lb 0.6 oz (81.2 kg)   SpO2 98%   BMI 33.83 kg/m     Wt Readings from Last 3 Encounters:  07/11/17 179 lb 0.6 oz (81.2 kg)  06/13/17 178 lb (80.7 kg)  05/29/17 179 lb 4 oz (81.3 kg)     GEN: Patient is in no acute distress HEENT: Normal NECK: No JVD; No carotid bruits LYMPHATICS: No lymphadenopathy CARDIAC: Hear sounds regular, 2/6 systolic murmur at the apex. RESPIRATORY:  Clear to auscultation without rales, wheezing or rhonchi  ABDOMEN: Soft, non-tender, non-distended MUSCULOSKELETAL:  No edema; No deformity  SKIN: Warm and dry NEUROLOGIC:  Alert and oriented x 3 PSYCHIATRIC:  Normal affect   Signed, Jenean Lindau, MD  07/12/2017 10:08 AM    [place]

## 2017-07-16 ENCOUNTER — Ambulatory Visit (INDEPENDENT_AMBULATORY_CARE_PROVIDER_SITE_OTHER): Payer: Medicare Other | Admitting: Podiatry

## 2017-07-16 DIAGNOSIS — B351 Tinea unguium: Secondary | ICD-10-CM | POA: Diagnosis not present

## 2017-07-16 DIAGNOSIS — M79672 Pain in left foot: Secondary | ICD-10-CM

## 2017-07-16 DIAGNOSIS — M79671 Pain in right foot: Secondary | ICD-10-CM | POA: Diagnosis not present

## 2017-07-16 NOTE — Progress Notes (Signed)
Subjective: 84 y.o. year old female patient presents complaining of painful nails. Patient requests toe nails trimmed and grinded.   Objective: Dermatologic: Thick yellow deformed nails x 10. Vascular: Pedal pulses are all palpable. Orthopedic: No gross deformities. Neurologic: All epicritic and tactile sensations grossly intact.  Assessment: Dystrophic mycotic nails x 10. Painful feet.  Treatment: All mycotic nails debrided and grinded. Return in 3 months or as needed.

## 2017-07-16 NOTE — Patient Instructions (Addendum)
Seen for hypertrophic nails. All nails debrided and grinded. Return in 3 months or as needed.

## 2017-07-17 ENCOUNTER — Encounter: Payer: Self-pay | Admitting: Podiatry

## 2017-07-25 NOTE — Progress Notes (Deleted)
Subjective:   Marie Hebert is a 84 y.o. female who presents for Medicare Annual (Subsequent) preventive examination.  Review of Systems: No ROS.  Medicare Wellness Visit. Additional risk factors are reflected in the social history.   Sleep patterns:  Home Safety/Smoke Alarms: Feels safe in home. Smoke alarms in place.  Living environment; residence and Firearm Safety: . [place] Safety/Bike Helmet: Wears seat belt.   Female:        Mammo-       Dexa scan- last 03/22/16: osteopenia         CCS-  Objective:     Vitals: There were no vitals taken for this visit.  There is no height or weight on file to calculate BMI.  No flowsheet data found.  Tobacco Social History   Tobacco Use  Smoking Status Never Smoker  Smokeless Tobacco Never Used     Counseling given: Not Answered   Clinical Intake:                       Past Medical History:  Diagnosis Date  . Back pain   . Dizziness   . Hypertension   . Palpitations    Past Surgical History:  Procedure Laterality Date  . CATARACT EXTRACTION  2010   right  . RETINAL LASER PROCEDURE     Family History  Problem Relation Age of Onset  . Cancer Father   . CAD Neg Hx   . Colon cancer Neg Hx   . Breast cancer Neg Hx    Social History   Socioeconomic History  . Marital status: Widowed    Spouse name: Not on file  . Number of children: 4  . Years of education: Not on file  . Highest education level: Not on file  Social Needs  . Financial resource strain: Not on file  . Food insecurity - worry: Not on file  . Food insecurity - inability: Not on file  . Transportation needs - medical: Not on file  . Transportation needs - non-medical: Not on file  Occupational History  . Occupation: n/a  Tobacco Use  . Smoking status: Never Smoker  . Smokeless tobacco: Never Used  Substance and Sexual Activity  . Alcohol use: No    Alcohol/week: 0.0 oz  . Drug use: No  . Sexual activity: Not on file    Other Topics Concern  . Not on file  Social History Narrative   Lives by herself      Outpatient Encounter Medications as of 07/30/2017  Medication Sig  . albuterol (PROVENTIL HFA;VENTOLIN HFA) 108 (90 Base) MCG/ACT inhaler Inhale into the lungs.  . Cholecalciferol (VITAMIN D) 1000 UNITS capsule Take by mouth 2 (two) times daily.   . Coenzyme Q10 100 MG capsule Take by mouth.  . conjugated estrogens (PREMARIN) vaginal cream Place 1 Applicatorful vaginally once a week.  . diltiazem (CARDIZEM CD) 180 MG 24 hr capsule Take 1 capsule (180 mg total) by mouth daily.  Marland Kitchen diltiazem (CARDIZEM) 120 MG tablet Take 120 mg by mouth.  Marland Kitchen ibuprofen (ADVIL,MOTRIN) 400 MG tablet Take 400 mg by mouth.  . Magnesium 500 MG CAPS Take 1 capsule by mouth daily.    . Multiple Vitamin (THERA) TABS Take by mouth.  . Omega-3 Fatty Acids (FISH OIL) 1000 MG CAPS Take 1 capsule by mouth daily.    Marland Kitchen sulfamethoxazole-trimethoprim (BACTRIM DS,SEPTRA DS) 800-160 MG tablet Take 1 tablet by mouth 2 (two) times daily. (Patient  not taking: Reported on 06/13/2017)  . vitamin C (ASCORBIC ACID) 500 MG tablet Take 500 mg by mouth daily.   No facility-administered encounter medications on file as of 07/30/2017.     Activities of Daily Living In your present state of health, do you have any difficulty performing the following activities: 01/22/2017  Hearing? N  Vision? N  Difficulty concentrating or making decisions? N  Walking or climbing stairs? N  Dressing or bathing? N  Doing errands, shopping? N  Some recent data might be hidden    Patient Care Team: Colon Branch, MD as PCP - General (Internal Medicine) Alexis Frock, MD as Consulting Physician (Urology)    Assessment:   This is a routine wellness examination for [name]. Physical assessment deferred to PCP.  Exercise Activities and Dietary recommendations   Diet (meal preparation, eat out, water intake, caffeinated beverages, dairy products, fruits and  vegetables): {Desc; diets:16563} Breakfast: Lunch:  Dinner:      Goals    None      Fall Risk Fall Risk  01/22/2017 04/06/2015 04/22/2014 03/18/2013  Falls in the past year? No No No No     Depression Screen PHQ 2/9 Scores 01/22/2017 04/06/2015 04/22/2014 03/18/2013  PHQ - 2 Score 0 0 0 0     Cognitive Function        Immunization History  Administered Date(s) Administered  . Influenza Whole 03/24/2009  . Td 02/13/2008     Screening Tests Health Maintenance  Topic Date Due  . PNA vac Low Risk Adult (1 of 2 - PCV13) 12/27/1999  . INFLUENZA VACCINE  01/23/2017  . TETANUS/TDAP  02/12/2018  . DEXA SCAN  Completed        Plan:   ***   I have personally reviewed and noted the following in the patient's chart:   . Medical and social history . Use of alcohol, tobacco or illicit drugs  . Current medications and supplements . Functional ability and status . Nutritional status . Physical activity . Advanced directives . List of other physicians . Hospitalizations, surgeries, and ER visits in previous 12 months . Vitals . Screenings to include cognitive, depression, and falls . Referrals and appointments  In addition, I have reviewed and discussed with patient certain preventive protocols, quality metrics, and best practice recommendations. A written personalized care plan for preventive services as well as general preventive health recommendations were provided to patient.     Shela Nevin, [place]  07/25/2017

## 2017-07-29 ENCOUNTER — Ambulatory Visit (INDEPENDENT_AMBULATORY_CARE_PROVIDER_SITE_OTHER): Payer: Medicare Other | Admitting: Internal Medicine

## 2017-07-29 ENCOUNTER — Encounter: Payer: Self-pay | Admitting: Internal Medicine

## 2017-07-29 VITALS — BP 124/66 | HR 74 | Temp 98.0°F | Resp 14 | Ht 61.0 in | Wt 177.2 lb

## 2017-07-29 DIAGNOSIS — N390 Urinary tract infection, site not specified: Secondary | ICD-10-CM

## 2017-07-29 DIAGNOSIS — R319 Hematuria, unspecified: Secondary | ICD-10-CM

## 2017-07-29 DIAGNOSIS — R399 Unspecified symptoms and signs involving the genitourinary system: Secondary | ICD-10-CM | POA: Diagnosis not present

## 2017-07-29 DIAGNOSIS — I10 Essential (primary) hypertension: Secondary | ICD-10-CM

## 2017-07-29 LAB — POC URINALSYSI DIPSTICK (AUTOMATED)
BILIRUBIN UA: NEGATIVE
Glucose, UA: NEGATIVE
KETONES UA: NEGATIVE
Leukocytes, UA: NEGATIVE
Nitrite, UA: NEGATIVE
PH UA: 6 (ref 5.0–8.0)
Protein, UA: NEGATIVE
Spec Grav, UA: 1.01 (ref 1.010–1.025)
Urobilinogen, UA: 0.2 E.U./dL

## 2017-07-29 LAB — URINALYSIS, ROUTINE W REFLEX MICROSCOPIC
Bilirubin Urine: NEGATIVE
Ketones, ur: NEGATIVE
NITRITE: NEGATIVE
Specific Gravity, Urine: <=1.005 — AB (ref 1.000–1.030)
Total Protein, Urine: NEGATIVE
Urine Glucose: NEGATIVE
Urobilinogen, UA: 0.2 (ref 0.0–1.0)
pH: 7 (ref 5.0–8.0)

## 2017-07-29 MED ORDER — NITROFURANTOIN MONOHYD MACRO 100 MG PO CAPS: 100.0000 mg | ORAL_CAPSULE | Freq: Two times a day (BID) | ORAL | 0 refills | Status: DC

## 2017-07-29 NOTE — Patient Instructions (Addendum)
  GO TO THE FRONT DESK Schedule your next appointment for a physical exam in 8-10 months  Drink plenty fluids  Take the antibiotic as prescribed   Check the  blood pressure  monthly   Be sure your blood pressure is between 110/65 and  135/85. If it is consistently higher or lower, let me know

## 2017-07-29 NOTE — Progress Notes (Signed)
Subjective:    Patient ID: Marie Hebert, female    DOB: [birth year], 84 y.o.   MRN: [id number]  DOS:  07/29/2017 Type of visit - description : Acute visit Interval history: 3 days ago developed UTI symptoms: Dysuria, urinary frequency and suprapubic discomfort.  Since she is here, we addressed other issues: HTN: Good compliance with medication, ambulatory BPs within normal. Was seen few months ago with indigestion and palpitations, chart reviewed.  Review of Systems Denies fever chills No nausea, vomiting or flank pain. No gross hematuria.  Past Medical History:  Diagnosis Date  . Back pain   . Dizziness   . Hypertension   . Palpitations     Past Surgical History:  Procedure Laterality Date  . CATARACT EXTRACTION  2010   right  . RETINAL LASER PROCEDURE      Social History   Socioeconomic History  . Marital status: Widowed    Spouse name: Not on file  . Number of children: 4  . Years of education: Not on file  . Highest education level: Not on file  Social Needs  . Financial resource strain: Not on file  . Food insecurity - worry: Not on file  . Food insecurity - inability: Not on file  . Transportation needs - medical: Not on file  . Transportation needs - non-medical: Not on file  Occupational History  . Occupation: n/a  Tobacco Use  . Smoking status: Never Smoker  . Smokeless tobacco: Never Used  Substance and Sexual Activity  . Alcohol use: No    Alcohol/week: 0.0 oz  . Drug use: No  . Sexual activity: Not on file  Other Topics Concern  . Not on file  Social History Narrative   Lives by herself        Allergies as of 07/29/2017      Reactions   Aspirin    Ibuprofen Other (See Comments)      Medication List        Accurate as of 07/29/17 11:36 AM. Always use your most recent med list.          albuterol 108 (90 Base) MCG/ACT inhaler Commonly known as:  PROVENTIL HFA;VENTOLIN HFA Inhale into the lungs.   Coenzyme Q10 100 MG  capsule Take by mouth.   conjugated estrogens vaginal cream Commonly known as:  PREMARIN Place 1 Applicatorful vaginally once a week.   diltiazem 120 MG tablet Commonly known as:  CARDIZEM Take 120 mg by mouth.   diltiazem 180 MG 24 hr capsule Commonly known as:  CARDIZEM CD Take 1 capsule (180 mg total) by mouth daily.   Fish Oil 1000 MG Caps Take 1 capsule by mouth daily.   ibuprofen 400 MG tablet Commonly known as:  ADVIL,MOTRIN Take 400 mg by mouth.   Magnesium 500 MG Caps Take 1 capsule by mouth daily.   sulfamethoxazole-trimethoprim 800-160 MG tablet Commonly known as:  BACTRIM DS,SEPTRA DS Take 1 tablet by mouth 2 (two) times daily.   THERA Tabs Take by mouth.   vitamin C 500 MG tablet Commonly known as:  ASCORBIC ACID Take 500 mg by mouth daily.   Vitamin D 1000 units capsule Take by mouth 2 (two) times daily.          Objective:   Physical Exam BP 124/66 (BP Location: Left Arm, Patient Position: Sitting, Cuff Size: Small)   Pulse 74   Temp 98 F (36.7 C) (Oral)   Resp 14   Ht 5\' 1"  (  1.549 m)   Wt 177 lb 4 oz (80.4 kg)   SpO2 96%   BMI 33.49 kg/m  General:   Well developed, well nourished . NAD.  HEENT:  Normocephalic . Face symmetric, atraumatic Lungs:  CTA B Normal respiratory effort, no intercostal retractions, no accessory muscle use. Heart: RRR,  no murmur.  no pretibial edema bilaterally  Abdomen:  Not distended, soft, minimal discomfort at the suprapubic area without mass or rebound.  No CVA tenderness Skin: Not pale. Not jaundice Neurologic:  alert & oriented X3.  Speech normal, gait appropriate for age and unassisted Psych--  Cognition and judgment appear intact.  Cooperative with normal attention span and concentration.  Behavior appropriate. No anxious or depressed appearing.     Assessment & Plan:    Assessment  HTN (h/o palpitations, on cardiazem) DJD Recurrent UTIs; extensive urology w/u  2015:atrophic vaginitis,  eventually decided to try hormonal creams Back pain, chronic, needs a disability parking  Palpitations: Saw cardiology 05-2017: Holter, echo, stress echo essentially negative  PLAN: UTI: Has sxs of a UTI, Udip with blood, will send a UA and urine culture, start empiric Macrobid. HTN: On Cardizem, BP today is very good, ambulatory BPs within normal Recurrent UTIs: Was recommended Premarin vaginal cream, patient self d/c, she is afraid of cancer. Palpitations, indigestion: Few months ago was seen by primary care and cardiology, H. pylori negative, cardiology workup negative, she is now asymptomatic. RTC 8 months, CPX

## 2017-07-29 NOTE — Progress Notes (Signed)
Pre visit review using our clinic review tool, if applicable. No additional management support is needed unless otherwise documented below in the visit note. 

## 2017-07-30 ENCOUNTER — Encounter: Payer: Self-pay | Admitting: Internal Medicine

## 2017-07-30 ENCOUNTER — Ambulatory Visit: Payer: Self-pay | Admitting: *Deleted

## 2017-07-30 LAB — URINE CULTURE
MICRO NUMBER:: 90146880
RESULT: NO GROWTH
SPECIMEN QUALITY: ADEQUATE

## 2017-07-30 NOTE — Assessment & Plan Note (Signed)
UTI: Has sxs of a UTI, Udip with blood, will send a UA and urine culture, start empiric Macrobid. HTN: On Cardizem, BP today is very good, ambulatory BPs within normal Recurrent UTIs: Was recommended Premarin vaginal cream, patient self d/c, she is afraid of cancer. Palpitations, indigestion: Few months ago was seen by primary care and cardiology, H. pylori negative, cardiology workup negative, she is now asymptomatic. RTC 8 months, CPX

## 2017-10-15 ENCOUNTER — Ambulatory Visit: Payer: Medicare Other | Admitting: Podiatry

## 2017-10-21 DIAGNOSIS — H029 Unspecified disorder of eyelid: Secondary | ICD-10-CM | POA: Diagnosis not present

## 2017-10-24 DIAGNOSIS — Z961 Presence of intraocular lens: Secondary | ICD-10-CM | POA: Diagnosis not present

## 2017-10-24 DIAGNOSIS — D23121 Other benign neoplasm of skin of left upper eyelid, including canthus: Secondary | ICD-10-CM | POA: Diagnosis not present

## 2017-10-24 DIAGNOSIS — H04123 Dry eye syndrome of bilateral lacrimal glands: Secondary | ICD-10-CM | POA: Diagnosis not present

## 2017-10-31 ENCOUNTER — Telehealth: Payer: Self-pay | Admitting: Internal Medicine

## 2017-10-31 NOTE — Telephone Encounter (Signed)
Please advise 

## 2017-10-31 NOTE — Telephone Encounter (Signed)
Copied from [place] [id number]. Topic: Quick Communication - See Telephone Encounter >> Oct 31, 2017  3:28 PM Cleaster Corin, NT wrote: CRM for notification. See Telephone encounter for: 10/31/17.  Pt. Calling to see if she can get something called in for having the cramps in her legs.pt. States that she has tried everything and it isn't working [phone]  [id number] [address], [place] [address] [place] [address] Phone: [phone] Fax: [phone]

## 2017-11-01 MED ORDER — CYCLOBENZAPRINE HCL 5 MG PO TABS: 5.0000 mg | ORAL_TABLET | Freq: Every evening | ORAL | 0 refills | Status: DC | PRN

## 2017-11-01 NOTE — Telephone Encounter (Signed)
Recommend to try tonic water, 1 glass at bedtime.  A lot of people get relief from that. We could also try Flexeril 5 mg 1 p.o. nightly, send a prescription for #30 no refill.  That is a muscle relaxant, will cause drowsiness.

## 2017-11-01 NOTE — Telephone Encounter (Signed)
Spoke w/ Pt, informed of recommendations. Rx sent to [facility]. Pt will also try tonic water.

## 2017-11-06 ENCOUNTER — Encounter: Payer: Self-pay | Admitting: Podiatry

## 2017-11-06 ENCOUNTER — Ambulatory Visit (INDEPENDENT_AMBULATORY_CARE_PROVIDER_SITE_OTHER): Payer: Medicare Other | Admitting: Podiatry

## 2017-11-06 DIAGNOSIS — M79671 Pain in right foot: Secondary | ICD-10-CM | POA: Diagnosis not present

## 2017-11-06 DIAGNOSIS — M79672 Pain in left foot: Secondary | ICD-10-CM

## 2017-11-06 DIAGNOSIS — B351 Tinea unguium: Secondary | ICD-10-CM | POA: Diagnosis not present

## 2017-11-06 NOTE — Patient Instructions (Signed)
Seen for hypertrophic nails. All nails debrided. Return in 3 months or as needed.  

## 2017-11-06 NOTE — Progress Notes (Signed)
Subjective: 84 y.o. year old female patient presents complaining of painful nails. Patient requests toe nails trimmed.   Objective: Dermatologic: Thick yellow deformed nails x 10. Vascular: Pedal pulses are all palpable. Orthopedic: No abnormal findings. Neurologic: All epicritic and tactile sensations grossly intact.  Assessment: Dystrophic mycotic nails x 10. Pain in toes.  Treatment: All mycotic nails debrided.  Grinded all sharp edges. Return in 3 months or as needed.

## 2017-11-12 DIAGNOSIS — H43813 Vitreous degeneration, bilateral: Secondary | ICD-10-CM | POA: Diagnosis not present

## 2017-11-25 DIAGNOSIS — M9903 Segmental and somatic dysfunction of lumbar region: Secondary | ICD-10-CM | POA: Diagnosis not present

## 2017-11-25 DIAGNOSIS — M9902 Segmental and somatic dysfunction of thoracic region: Secondary | ICD-10-CM | POA: Diagnosis not present

## 2017-11-25 DIAGNOSIS — M5134 Other intervertebral disc degeneration, thoracic region: Secondary | ICD-10-CM | POA: Diagnosis not present

## 2017-11-25 DIAGNOSIS — M9901 Segmental and somatic dysfunction of cervical region: Secondary | ICD-10-CM | POA: Diagnosis not present

## 2017-11-25 DIAGNOSIS — M5032 Other cervical disc degeneration, mid-cervical region, unspecified level: Secondary | ICD-10-CM | POA: Diagnosis not present

## 2017-11-28 ENCOUNTER — Other Ambulatory Visit: Payer: Self-pay | Admitting: Internal Medicine

## 2017-11-28 NOTE — Telephone Encounter (Signed)
Requesting: cyclobenzaprine 5mg  HS prn Contract: none UDS: none Last OV: 07/29/17 Next Ov: none Last refill: 11/01/17 Database: unknown  Please advise.

## 2017-11-29 ENCOUNTER — Telehealth: Payer: Self-pay

## 2017-11-29 NOTE — Telephone Encounter (Signed)
Copied from [place] [id number]. Topic: General - Other >> Nov 28, 2017  2:15 PM Carolyn Stare wrote:  Pt call to say her bp has been low and is thinking that she no longer needs to take the below med or lower the dose. She said she has lost weight and cut out sugar and some other things  .    diltiazem (CARDIZEM CD) 180 MG 24 hr capsule  [phone]

## 2017-11-30 ENCOUNTER — Other Ambulatory Visit: Payer: Self-pay | Admitting: Internal Medicine

## 2017-12-03 NOTE — Telephone Encounter (Signed)
Received call from [facility] that pt is following up on below message. Per [facility], pt reports home BP today of 116/58-60. Pt did not mention any symptoms that have her concerned, just thought this reading is low and with the lifestyle changes she has made she is wanting to know if her meds should be adjusted. [facility] will verify pt is asymptomatic and will schedule OV to discuss changes with PCP.

## 2017-12-03 NOTE — Telephone Encounter (Signed)
Okay, thank you!

## 2017-12-04 ENCOUNTER — Ambulatory Visit (INDEPENDENT_AMBULATORY_CARE_PROVIDER_SITE_OTHER): Payer: Medicare Other | Admitting: Internal Medicine

## 2017-12-04 ENCOUNTER — Encounter: Payer: Self-pay | Admitting: Internal Medicine

## 2017-12-04 VITALS — BP 116/72 | HR 69 | Temp 97.8°F | Resp 16 | Ht 61.0 in | Wt 165.2 lb

## 2017-12-04 DIAGNOSIS — I10 Essential (primary) hypertension: Secondary | ICD-10-CM

## 2017-12-04 DIAGNOSIS — R252 Cramp and spasm: Secondary | ICD-10-CM

## 2017-12-04 MED ORDER — DILTIAZEM HCL ER COATED BEADS 120 MG PO CP24: 120.0000 mg | ORAL_CAPSULE | Freq: Every day | ORAL | 2 refills | Status: DC

## 2017-12-04 NOTE — Progress Notes (Signed)
Pre visit review using our clinic review tool, if applicable. No additional management support is needed unless otherwise documented below in the visit note. 

## 2017-12-04 NOTE — Progress Notes (Signed)
Subjective:    Patient ID: Marie Hebert, female    DOB: Aug 27, 1932, 84 y.o.   MRN: [id number]  DOS:  12/04/2017 Type of visit - description : rov Interval history: HTN: Patient was started on much healthier diet with low carbohydrate, doing great, losing weight.  BP is sometimes are as low as 116/59.  She likes to adjust her medications. Continue with leg cramps, decided not to try a muscle relaxant, she takes magnesium which helps sometimes.  Wt Readings from Last 3 Encounters:  12/04/17 165 lb 4 oz (75 kg)  07/29/17 177 lb 4 oz (80.4 kg)  07/11/17 179 lb 0.6 oz (81.2 kg)    Review of Systems Denies any urinary symptoms at this point, no dysuria or gross hematuria When BP is low, she does not have any symptoms such as weakness or dizziness  Past Medical History:  Diagnosis Date  . Back pain   . Dizziness   . Hypertension   . Palpitations     Past Surgical History:  Procedure Laterality Date  . CATARACT EXTRACTION  2010   right  . RETINAL LASER PROCEDURE      Social History   Socioeconomic History  . Marital status: Widowed    Spouse name: Not on file  . Number of children: 4  . Years of education: Not on file  . Highest education level: Not on file  Occupational History  . Occupation: n/a  Social Needs  . Financial resource strain: Not on file  . Food insecurity:    Worry: Not on file    Inability: Not on file  . Transportation needs:    Medical: Not on file    Non-medical: Not on file  Tobacco Use  . Smoking status: Never Smoker  . Smokeless tobacco: Never Used  Substance and Sexual Activity  . Alcohol use: No    Alcohol/week: 0.0 oz  . Drug use: No  . Sexual activity: Not on file  Lifestyle  . Physical activity:    Days per week: Not on file    Minutes per session: Not on file  . Stress: Not on file  Relationships  . Social connections:    Talks on phone: Not on file    Gets together: Not on file    Attends religious service: Not on file     Active member of club or organization: Not on file    Attends meetings of clubs or organizations: Not on file    Relationship status: Not on file  . Intimate partner violence:    Fear of current or ex partner: Not on file    Emotionally abused: Not on file    Physically abused: Not on file    Forced sexual activity: Not on file  Other Topics Concern  . Not on file  Social History Narrative   Lives by herself        Allergies as of 12/04/2017      Reactions   Aspirin    Ibuprofen Other (See Comments)      Medication List        Accurate as of 12/04/17 12:58 PM. Always use your most recent med list.          Coenzyme Q10 100 MG capsule Take by mouth.   diltiazem 120 MG 24 hr capsule Commonly known as:  DILTIAZEM CD Take 1 capsule (120 mg total) by mouth daily.   Fish Oil 1000 MG Caps Take 1 capsule by mouth daily.  ibuprofen 400 MG tablet Commonly known as:  ADVIL,MOTRIN Take 400 mg by mouth.   Magnesium 500 MG Caps Take 1 capsule by mouth daily.   THERA Tabs Take by mouth.   vitamin C 500 MG tablet Commonly known as:  ASCORBIC ACID Take 500 mg by mouth daily.   Vitamin D 1000 units capsule Take by mouth 2 (two) times daily.          Objective:   Physical Exam BP 116/72 (BP Location: Left Arm, Patient Position: Sitting, Cuff Size: Small)   Pulse 69   Temp 97.8 F (36.6 C) (Oral)   Resp 16   Ht 5\' 1"  (1.549 m)   Wt 165 lb 4 oz (75 kg)   SpO2 97%   BMI 31.22 kg/m  General:   Well developed, NAD, see BMI.  HEENT:  Normocephalic . Face symmetric, atraumatic Lungs:  CTA B Normal respiratory effort, no intercostal retractions, no accessory muscle use. Heart: RRR,  no murmur.  No pretibial edema bilaterally  Skin: Not pale. Not jaundice Neurologic:  alert & oriented X3.  Speech normal, gait appropriate for age and unassisted Psych--  Cognition and judgment appear intact.  Cooperative with normal attention span and concentration.    Behavior appropriate. No anxious or depressed appearing.      Assessment & Plan:   Assessment  HTN (h/o palpitations, on cardiazem) DJD Recurrent UTIs; extensive urology w/u  2015:atrophic vaginitis, eventually decided to try hormonal creams Back pain, chronic, needs a disability parking  Palpitations: Saw cardiology 05-2017: Holter, echo, stress echo essentially negative  PLAN: HTN: Although BP is not low per se, is running in the lower side of normal. she is doing great with diet, is losing weight, we agreed to decrease Cardizem to 120 mg daily.  Monitor BPs. UTI: Last urine culture was negative, she is currently asymptomatic Leg cramps: Was recommended a muscle relaxant, declined, recommend a trial with tonic water. RTC 03-2018 CPX.

## 2017-12-04 NOTE — Patient Instructions (Signed)
  GO TO THE FRONT DESK Schedule your next appointment for a  Physical exam by 03/2018    Check the  blood pressure 2 or 3 times a   Week  Be sure your blood pressure is between 110/65 and  135/85. If it is consistently higher or lower, let me know   Try Tonic Water for cramps

## 2017-12-04 NOTE — Assessment & Plan Note (Signed)
HTN: Although BP is not low per se, is running in the lower side of normal. she is doing great with diet, is losing weight, we agreed to decrease Cardizem to 120 mg daily.  Monitor BPs. UTI: Last urine culture was negative, she is currently asymptomatic Leg cramps: Was recommended a muscle relaxant, declined, recommend a trial with tonic water. RTC 03-2018 CPX.

## 2018-02-04 ENCOUNTER — Ambulatory Visit (INDEPENDENT_AMBULATORY_CARE_PROVIDER_SITE_OTHER): Payer: Medicare Other | Admitting: Podiatry

## 2018-02-04 ENCOUNTER — Encounter: Payer: Self-pay | Admitting: Podiatry

## 2018-02-04 DIAGNOSIS — B351 Tinea unguium: Secondary | ICD-10-CM | POA: Diagnosis not present

## 2018-02-04 DIAGNOSIS — M79672 Pain in left foot: Secondary | ICD-10-CM | POA: Diagnosis not present

## 2018-02-04 DIAGNOSIS — M79671 Pain in right foot: Secondary | ICD-10-CM

## 2018-02-04 NOTE — Progress Notes (Signed)
Subjective: 84 y.o. year old female patient presents complaining of painful nails. Patient requests toe nails trimmed. Denies any new problems.  Objective: Dermatologic: Thick yellow deformed nails x 10. Vascular: Pedal pulses are all palpable. Orthopedic: Contracted lesser digits bilateral. Neurologic: All epicritic and tactile sensations grossly intact.  Assessment: Dystrophic mycotic nails x 10. Painful feet.  Treatment: All mycotic nails debrided and grinded.  Return in 3 months or as needed.

## 2018-02-04 NOTE — Patient Instructions (Signed)
Seen for hypertrophic nails. All nails debrided. Return in 3 months or as needed.  

## 2018-02-06 ENCOUNTER — Ambulatory Visit: Payer: Medicare Other | Admitting: Podiatry

## 2018-04-01 DIAGNOSIS — M9903 Segmental and somatic dysfunction of lumbar region: Secondary | ICD-10-CM | POA: Diagnosis not present

## 2018-04-01 DIAGNOSIS — M5032 Other cervical disc degeneration, mid-cervical region, unspecified level: Secondary | ICD-10-CM | POA: Diagnosis not present

## 2018-04-01 DIAGNOSIS — M9902 Segmental and somatic dysfunction of thoracic region: Secondary | ICD-10-CM | POA: Diagnosis not present

## 2018-04-01 DIAGNOSIS — M9901 Segmental and somatic dysfunction of cervical region: Secondary | ICD-10-CM | POA: Diagnosis not present

## 2018-04-01 DIAGNOSIS — M5134 Other intervertebral disc degeneration, thoracic region: Secondary | ICD-10-CM | POA: Diagnosis not present

## 2018-04-08 ENCOUNTER — Encounter: Payer: Self-pay | Admitting: Internal Medicine

## 2018-04-28 ENCOUNTER — Encounter: Payer: Self-pay | Admitting: Internal Medicine

## 2018-04-29 ENCOUNTER — Encounter: Payer: Self-pay | Admitting: Internal Medicine

## 2018-05-07 ENCOUNTER — Ambulatory Visit (INDEPENDENT_AMBULATORY_CARE_PROVIDER_SITE_OTHER): Payer: Medicare Other | Admitting: Internal Medicine

## 2018-05-07 ENCOUNTER — Ambulatory Visit (HOSPITAL_BASED_OUTPATIENT_CLINIC_OR_DEPARTMENT_OTHER)
Admission: RE | Admit: 2018-05-07 | Discharge: 2018-05-07 | Disposition: A | Discharge status: Home / Self Care | Payer: Medicare Other | Source: Ambulatory Visit | Attending: Internal Medicine | Admitting: Internal Medicine

## 2018-05-07 ENCOUNTER — Ambulatory Visit: Payer: Medicare Other | Admitting: Podiatry

## 2018-05-07 ENCOUNTER — Encounter: Payer: Self-pay | Admitting: Internal Medicine

## 2018-05-07 VITALS — BP 126/70 | HR 70 | Temp 98.3°F | Resp 16 | Ht 61.0 in | Wt 168.0 lb

## 2018-05-07 DIAGNOSIS — Z0000 Encounter for general adult medical examination without abnormal findings: Secondary | ICD-10-CM | POA: Diagnosis not present

## 2018-05-07 DIAGNOSIS — M79671 Pain in right foot: Secondary | ICD-10-CM | POA: Diagnosis not present

## 2018-05-07 NOTE — Progress Notes (Signed)
Subjective:    Patient ID: Marie Hebert, female    DOB: [birth year], 84 y.o.   MRN: [id number]  DOS:  05/07/2018 Type of visit - description : cpx Interval history: In general feeling well.  She remains active, still driving, trying to eat healthy.   Review of Systems Reports on and off R knee and distal right foot pain. Denies any swelling redness or warmness on those areas. Knee pain is worse after she sits for prolonged periods of time. The most pressing issue to her right now is the foot pain. She has chronic, mild back pain on and off.  No radiculopathy type of symptoms.  Other than above, a 14 point review of systems is negative     Past Medical History:  Diagnosis Date  . Back pain   . Dizziness   . Hypertension   . Palpitations     Past Surgical History:  Procedure Laterality Date  . CATARACT EXTRACTION  2010   right  . RETINAL LASER PROCEDURE      Social History   Socioeconomic History  . Marital status: Widowed    Spouse name: Not on file  . Number of children: 4  . Years of education: Not on file  . Highest education level: Not on file  Occupational History  . Occupation: n/a  Social Needs  . Financial resource strain: Not on file  . Food insecurity:    Worry: Not on file    Inability: Not on file  . Transportation needs:    Medical: Not on file    Non-medical: Not on file  Tobacco Use  . Smoking status: Never Smoker  . Smokeless tobacco: Never Used  Substance and Sexual Activity  . Alcohol use: No    Alcohol/week: 0.0 standard drinks  . Drug use: No  . Sexual activity: Not on file  Lifestyle  . Physical activity:    Days per week: Not on file    Minutes per session: Not on file  . Stress: Not on file  Relationships  . Social connections:    Talks on phone: Not on file    Gets together: Not on file    Attends religious service: Not on file    Active member of club or organization: Not on file    Attends meetings of clubs or  organizations: Not on file    Relationship status: Not on file  . Intimate partner violence:    Fear of current or ex partner: Not on file    Emotionally abused: Not on file    Physically abused: Not on file    Forced sexual activity: Not on file  Other Topics Concern  . Not on file  Social History Narrative   Widower   Still drives   Lives by herself       Family History  Problem Relation Age of Onset  . Cancer Father   . CAD Neg Hx   . Colon cancer Neg Hx   . Breast cancer Neg Hx      Allergies as of 05/07/2018      Reactions   Aspirin    Ibuprofen Other (See Comments)      Medication List        Accurate as of 05/07/18 11:59 PM. Always use your most recent med list.          Coenzyme Q10 100 MG capsule Take by mouth.   diltiazem 120 MG 24 hr capsule Commonly known as:  CARDIZEM CD Take 1 capsule (120 mg total) by mouth daily.   Fish Oil 1000 MG Caps Take 1 capsule by mouth daily.   Magnesium 500 MG Caps Take 1 capsule by mouth daily.   THERA Tabs Take by mouth.   vitamin C 500 MG tablet Commonly known as:  ASCORBIC ACID Take 500 mg by mouth daily.   Vitamin D 1000 units capsule Take by mouth 2 (two) times daily.          Objective:   Physical Exam BP 126/70 (BP Location: Left Arm, Patient Position: Sitting, Cuff Size: Small)   Pulse 70   Temp 98.3 F (36.8 C) (Oral)   Resp 16   Ht 5\' 1"  (1.549 m)   Wt 168 lb (76.2 kg)   SpO2 97%   BMI 31.74 kg/m  General: Well developed, NAD, BMI noted Neck: No  thyromegaly  HEENT:  Normocephalic . Face symmetric, atraumatic Lungs:  CTA B Normal respiratory effort, no intercostal retractions, no accessory muscle use. Heart: RRR,  no murmur.  No pretibial edema bilaterally Normal pedal pulses Abdomen:  Not distended, soft, non-tender. No rebound or rigidity.   Skin: Exposed areas without rash. Not pale. Not jaundice MSK: Feet and ankles: Normal, mildly TTP at the metatarsal area on the  right foot.  No swelling, redness. Knees: Range of motion normal. Neurologic:  alert & oriented X3.  Speech normal, gait appropriate for age and unassisted Strength symmetric and appropriate for age. Straight leg test negative Psych: Cognition and judgment appear intact.  Cooperative with normal attention span and concentration.  Behavior appropriate. No anxious or depressed appearing.     Assessment & Plan:   Assessment  HTN (h/o palpitations, on cardiazem) DJD Recurrent UTIs; extensive urology w/u  2015:atrophic vaginitis, eventually decided to try hormonal creams Back pain, chronic, needs a disability parking  Palpitations: Saw cardiology 05-2017: Holter, echo, stress echo essentially negative Osteopenia: T score -1.5 (02/2016)  PLAN: Here for CPX HTN: Seems controlled on Cardizem. Foot pain: As described above, suspect right metatarsalgia, request further evaluation.  We will get a x-ray and refer to sports medicine. RTC 1 year.

## 2018-05-07 NOTE — Progress Notes (Signed)
Pre visit review using our clinic review tool, if applicable. No additional management support is needed unless otherwise documented below in the visit note. 

## 2018-05-07 NOTE — Assessment & Plan Note (Addendum)
Td 2009; again declines any and all shots or screening procedures. Reports a very healthy diet, takes calcium, vitamin D and supplements. Will come back fasting for labs: CMP, FLP, CBC

## 2018-05-07 NOTE — Patient Instructions (Addendum)
Please schedule your Medicare Wellness with [facility].   GO TO THE FRONT DESK Schedule fasting labs to be done soon  Next visit in 1 year for another physical exam   STOP BY THE FIRST FLOOR:  get the XR

## 2018-05-08 NOTE — Assessment & Plan Note (Signed)
Here for CPX HTN: Seems controlled on Cardizem. Foot pain: As described above, suspect right metatarsalgia, request further evaluation.  We will get a x-ray and refer to sports medicine. RTC 1 year.

## 2018-05-09 ENCOUNTER — Other Ambulatory Visit (INDEPENDENT_AMBULATORY_CARE_PROVIDER_SITE_OTHER): Payer: Medicare Other

## 2018-05-09 DIAGNOSIS — Z0000 Encounter for general adult medical examination without abnormal findings: Secondary | ICD-10-CM

## 2018-05-09 DIAGNOSIS — E875 Hyperkalemia: Secondary | ICD-10-CM

## 2018-05-09 LAB — COMPREHENSIVE METABOLIC PANEL
ALBUMIN: 4.2 g/dL (ref 3.5–5.2)
ALT: 13 U/L (ref 0–35)
AST: 20 U/L (ref 0–37)
Alkaline Phosphatase: 54 U/L (ref 39–117)
BUN: 18 mg/dL (ref 6–23)
CHLORIDE: 105 meq/L (ref 96–112)
CO2: 28 mEq/L (ref 19–32)
Calcium: 9.8 mg/dL (ref 8.4–10.5)
Creatinine, Ser: 0.67 mg/dL (ref 0.40–1.20)
GFR: 89.26 mL/min (ref >60.00)
Glucose, Bld: 93 mg/dL (ref 70–99)
POTASSIUM: 5.8 meq/L — AB (ref 3.5–5.1)
SODIUM: 140 meq/L (ref 135–145)
Total Bilirubin: 0.5 mg/dL (ref 0.2–1.2)
Total Protein: 6.7 g/dL (ref 6.0–8.3)

## 2018-05-09 LAB — LIPID PANEL
Cholesterol: 252 mg/dL — ABNORMAL HIGH (ref 0–200)
HDL: 96.2 mg/dL (ref >39.00)
LDL Cholesterol: 142 mg/dL — ABNORMAL HIGH (ref 0–99)
NonHDL: 155.9
Total CHOL/HDL Ratio: 3
Triglycerides: 70 mg/dL (ref 0.0–149.0)
VLDL: 14 mg/dL (ref 0.0–40.0)

## 2018-05-09 LAB — CBC WITH DIFFERENTIAL/PLATELET
Basophils Absolute: 0.1 10*3/uL (ref 0.0–0.1)
Basophils Relative: 1 % (ref 0.0–3.0)
EOS PCT: 3.6 % (ref 0.0–5.0)
Eosinophils Absolute: 0.2 10*3/uL (ref 0.0–0.7)
HCT: 44.9 % (ref 36.0–46.0)
HEMOGLOBIN: 14.8 g/dL (ref 12.0–15.0)
Lymphocytes Relative: 41.7 % (ref 12.0–46.0)
Lymphs Abs: 2.2 10*3/uL (ref 0.7–4.0)
MCHC: 32.9 g/dL (ref 30.0–36.0)
MCV: 91.4 fl (ref 78.0–100.0)
MONOS PCT: 8.6 % (ref 3.0–12.0)
Monocytes Absolute: 0.5 10*3/uL (ref 0.1–1.0)
Neutro Abs: 2.4 10*3/uL (ref 1.4–7.7)
Neutrophils Relative %: 45.1 % (ref 43.0–77.0)
Platelets: 191 10*3/uL (ref 150.0–400.0)
RBC: 4.91 Mil/uL (ref 3.87–5.11)
RDW: 14.7 % (ref 11.5–15.5)
WBC: 5.4 10*3/uL (ref 4.0–10.5)

## 2018-05-12 NOTE — Addendum Note (Signed)
Addended byDamita Dunnings D on: 05/12/2018 09:41 AM   Modules accepted: Orders

## 2018-05-13 ENCOUNTER — Encounter: Payer: Self-pay | Admitting: Family Medicine

## 2018-05-13 ENCOUNTER — Ambulatory Visit (INDEPENDENT_AMBULATORY_CARE_PROVIDER_SITE_OTHER): Payer: Medicare Other | Admitting: Family Medicine

## 2018-05-13 DIAGNOSIS — M79671 Pain in right foot: Secondary | ICD-10-CM

## 2018-05-13 NOTE — Progress Notes (Signed)
PCP and consultation requested by: Colon Branch, MD  Subjective:   HPI: Patient is a 84 y.o. female here for right foot pain.  Patient reports she's had dorsal discomfort right foot for about 3 months. No acute injury or trauma. She like to walk for exercise. Pain level dorsal foot up to 8/10 and sharp with walking. Feels a burning locally as well. Discoloration seen in 2nd digit. Tried epsom salt soaks. Also saw chiropractor which helped with her back but not the foot. Doesn't take any medicine for this.  Past Medical History:  Diagnosis Date  . Back pain   . Dizziness   . Hypertension   . Palpitations     Current Outpatient Medications on File Prior to Visit  Medication Sig Dispense Refill  . Cholecalciferol (VITAMIN D) 1000 UNITS capsule Take by mouth 2 (two) times daily.     . Coenzyme Q10 100 MG capsule Take by mouth.    . diltiazem (DILTIAZEM CD) 120 MG 24 hr capsule Take 1 capsule (120 mg total) by mouth daily. 90 capsule 2  . Magnesium 500 MG CAPS Take 1 capsule by mouth daily.      . Multiple Vitamin (THERA) TABS Take by mouth.    . Omega-3 Fatty Acids (FISH OIL) 1000 MG CAPS Take 1 capsule by mouth daily.      . vitamin C (ASCORBIC ACID) 500 MG tablet Take 500 mg by mouth daily.     No current facility-administered medications on file prior to visit.     Past Surgical History:  Procedure Laterality Date  . CATARACT EXTRACTION  2010   right  . RETINAL LASER PROCEDURE      Allergies  Allergen Reactions  . Aspirin   . Ibuprofen Other (See Comments)    Social History   Socioeconomic History  . Marital status: Widowed    Spouse name: Not on file  . Number of children: 4  . Years of education: Not on file  . Highest education level: Not on file  Occupational History  . Occupation: n/a  Social Needs  . Financial resource strain: Not on file  . Food insecurity:    Worry: Not on file    Inability: Not on file  . Transportation needs:    Medical: Not  on file    Non-medical: Not on file  Tobacco Use  . Smoking status: Never Smoker  . Smokeless tobacco: Never Used  Substance and Sexual Activity  . Alcohol use: No    Alcohol/week: 0.0 standard drinks  . Drug use: No  . Sexual activity: Not on file  Lifestyle  . Physical activity:    Days per week: Not on file    Minutes per session: Not on file  . Stress: Not on file  Relationships  . Social connections:    Talks on phone: Not on file    Gets together: Not on file    Attends religious service: Not on file    Active member of club or organization: Not on file    Attends meetings of clubs or organizations: Not on file    Relationship status: Not on file  . Intimate partner violence:    Fear of current or ex partner: Not on file    Emotionally abused: Not on file    Physically abused: Not on file    Forced sexual activity: Not on file  Other Topics Concern  . Not on file  Social History Narrative   Widower  Still drives   Lives by herself      Family History  Problem Relation Age of Onset  . Cancer Father   . CAD Neg Hx   . Colon cancer Neg Hx   . Breast cancer Neg Hx     There were no vitals taken for this visit.  Review of Systems: See HPI above.     Objective:  Physical Exam:  Gen: NAD, comfortable in exam room  Right foot/ankle: Early hammering of 2nd digit.  No other gross deformity, swelling, ecchymoses FROM ankle with 5/5 strength.  Full flexion and extension of digits without pain on resistance. TTP dorsal 2nd-4th metatarsals.  No plantar tenderness. Negative ant drawer and talar tilt.   Negative syndesmotic compression. Negative metatarsal squeeze. Thompsons test negative. NV intact distally.  Left foot/ankle: No deformity. FROM with 5/5 strength. No tenderness to palpation. NVI distally.   MSK u/s right foot:  No cortical irregularities of metatarsals or edema overlying cortices.  Extensor tendons intact without tenosynovitis or tear.     Assessment & Plan:  1. Right foot pain - history, exam, ultrasound reassuring.  2/2 extensor digitorum tendinopathy.  Icing, topical medications reviewed.  Advised cam walker but patient declined.  Follow up with me in 4 weeks.

## 2018-05-13 NOTE — Patient Instructions (Addendum)
You have an extensor tendinitis of your foot/toes. I'd recommend the boot when up and walking around for at least the next few weeks. Icing 15 minutes at a time 3-4 times a day. Since you are allergic to ibuprofen I'd recommend trying a topical medicine like aspercreme up to 4 times a day over the painful area. Follow up with me in 4 weeks for reevaluation.

## 2018-05-20 ENCOUNTER — Other Ambulatory Visit: Payer: Medicare Other

## 2018-05-26 ENCOUNTER — Other Ambulatory Visit (INDEPENDENT_AMBULATORY_CARE_PROVIDER_SITE_OTHER): Payer: Medicare Other

## 2018-05-26 DIAGNOSIS — E875 Hyperkalemia: Secondary | ICD-10-CM

## 2018-05-26 LAB — BASIC METABOLIC PANEL
BUN: 16 mg/dL (ref 6–23)
CALCIUM: 9.8 mg/dL (ref 8.4–10.5)
CO2: 29 mEq/L (ref 19–32)
CREATININE: 0.64 mg/dL (ref 0.40–1.20)
Chloride: 105 mEq/L (ref 96–112)
GFR: 94.1 mL/min (ref >60.00)
Glucose, Bld: 110 mg/dL — ABNORMAL HIGH (ref 70–99)
Potassium: 5.6 mEq/L — ABNORMAL HIGH (ref 3.5–5.1)
Sodium: 142 mEq/L (ref 135–145)

## 2018-05-28 DIAGNOSIS — M9903 Segmental and somatic dysfunction of lumbar region: Secondary | ICD-10-CM | POA: Diagnosis not present

## 2018-05-28 DIAGNOSIS — M9901 Segmental and somatic dysfunction of cervical region: Secondary | ICD-10-CM | POA: Diagnosis not present

## 2018-05-28 DIAGNOSIS — M5134 Other intervertebral disc degeneration, thoracic region: Secondary | ICD-10-CM | POA: Diagnosis not present

## 2018-05-28 DIAGNOSIS — M9902 Segmental and somatic dysfunction of thoracic region: Secondary | ICD-10-CM | POA: Diagnosis not present

## 2018-05-28 DIAGNOSIS — M5032 Other cervical disc degeneration, mid-cervical region, unspecified level: Secondary | ICD-10-CM | POA: Diagnosis not present

## 2018-05-28 NOTE — Addendum Note (Signed)
Addended byDamita Dunnings D on: 05/28/2018 03:30 PM   Modules accepted: Orders

## 2018-05-30 NOTE — Addendum Note (Signed)
Addended byDamita Dunnings D on: 05/30/2018 08:49 AM   Modules accepted: Orders

## 2018-06-03 ENCOUNTER — Telehealth: Payer: Self-pay

## 2018-06-03 NOTE — Telephone Encounter (Signed)
Copied from [place] [id number]. Topic: General - Other >> Jun 03, 2018  9:26 AM Rayann Heman wrote: Reason for CRM: pt called and stated that she would like to talk to dr paz nurse about a text she got regarding emmi report. Please advise

## 2018-06-03 NOTE — Telephone Encounter (Signed)
Raquel Sarna- do you know which EMMI the front is working on?

## 2018-06-05 NOTE — Telephone Encounter (Signed)
Please advise 

## 2018-06-13 ENCOUNTER — Other Ambulatory Visit (INDEPENDENT_AMBULATORY_CARE_PROVIDER_SITE_OTHER): Payer: Medicare Other

## 2018-06-13 DIAGNOSIS — E875 Hyperkalemia: Secondary | ICD-10-CM

## 2018-06-13 LAB — BASIC METABOLIC PANEL
BUN: 16 mg/dL (ref 6–23)
CALCIUM: 9.4 mg/dL (ref 8.4–10.5)
CO2: 29 mEq/L (ref 19–32)
Chloride: 104 mEq/L (ref 96–112)
Creatinine, Ser: 0.6 mg/dL (ref 0.40–1.20)
GFR: 101.36 mL/min (ref >60.00)
GLUCOSE: 103 mg/dL — AB (ref 70–99)
POTASSIUM: 4.9 meq/L (ref 3.5–5.1)
SODIUM: 140 meq/L (ref 135–145)

## 2018-06-27 DIAGNOSIS — M79671 Pain in right foot: Secondary | ICD-10-CM | POA: Diagnosis not present

## 2018-06-27 DIAGNOSIS — M25511 Pain in right shoulder: Secondary | ICD-10-CM | POA: Diagnosis not present

## 2018-07-07 DIAGNOSIS — M25611 Stiffness of right shoulder, not elsewhere classified: Secondary | ICD-10-CM | POA: Diagnosis not present

## 2018-07-07 DIAGNOSIS — M25511 Pain in right shoulder: Secondary | ICD-10-CM | POA: Diagnosis not present

## 2018-07-07 DIAGNOSIS — M79671 Pain in right foot: Secondary | ICD-10-CM | POA: Diagnosis not present

## 2018-07-07 DIAGNOSIS — R29898 Other symptoms and signs involving the musculoskeletal system: Secondary | ICD-10-CM | POA: Diagnosis not present

## 2018-07-17 DIAGNOSIS — R29898 Other symptoms and signs involving the musculoskeletal system: Secondary | ICD-10-CM | POA: Diagnosis not present

## 2018-07-17 DIAGNOSIS — M25511 Pain in right shoulder: Secondary | ICD-10-CM | POA: Diagnosis not present

## 2018-07-17 DIAGNOSIS — M79671 Pain in right foot: Secondary | ICD-10-CM | POA: Diagnosis not present

## 2018-07-17 DIAGNOSIS — M25611 Stiffness of right shoulder, not elsewhere classified: Secondary | ICD-10-CM | POA: Diagnosis not present

## 2018-07-22 DIAGNOSIS — M25611 Stiffness of right shoulder, not elsewhere classified: Secondary | ICD-10-CM | POA: Diagnosis not present

## 2018-07-22 DIAGNOSIS — R29898 Other symptoms and signs involving the musculoskeletal system: Secondary | ICD-10-CM | POA: Diagnosis not present

## 2018-07-22 DIAGNOSIS — M79671 Pain in right foot: Secondary | ICD-10-CM | POA: Diagnosis not present

## 2018-07-22 DIAGNOSIS — M25511 Pain in right shoulder: Secondary | ICD-10-CM | POA: Diagnosis not present

## 2018-07-24 DIAGNOSIS — M25511 Pain in right shoulder: Secondary | ICD-10-CM | POA: Diagnosis not present

## 2018-07-24 DIAGNOSIS — M79671 Pain in right foot: Secondary | ICD-10-CM | POA: Diagnosis not present

## 2018-07-24 DIAGNOSIS — R29898 Other symptoms and signs involving the musculoskeletal system: Secondary | ICD-10-CM | POA: Diagnosis not present

## 2018-07-24 DIAGNOSIS — M25611 Stiffness of right shoulder, not elsewhere classified: Secondary | ICD-10-CM | POA: Diagnosis not present

## 2018-07-29 DIAGNOSIS — R29898 Other symptoms and signs involving the musculoskeletal system: Secondary | ICD-10-CM | POA: Diagnosis not present

## 2018-07-29 DIAGNOSIS — M25511 Pain in right shoulder: Secondary | ICD-10-CM | POA: Diagnosis not present

## 2018-07-29 DIAGNOSIS — M25611 Stiffness of right shoulder, not elsewhere classified: Secondary | ICD-10-CM | POA: Diagnosis not present

## 2018-07-29 DIAGNOSIS — M79671 Pain in right foot: Secondary | ICD-10-CM | POA: Diagnosis not present

## 2018-08-01 DIAGNOSIS — M79671 Pain in right foot: Secondary | ICD-10-CM | POA: Diagnosis not present

## 2018-08-05 DIAGNOSIS — M79671 Pain in right foot: Secondary | ICD-10-CM | POA: Diagnosis not present

## 2018-08-05 DIAGNOSIS — M25511 Pain in right shoulder: Secondary | ICD-10-CM | POA: Diagnosis not present

## 2018-08-05 DIAGNOSIS — R29898 Other symptoms and signs involving the musculoskeletal system: Secondary | ICD-10-CM | POA: Diagnosis not present

## 2018-08-05 DIAGNOSIS — M25611 Stiffness of right shoulder, not elsewhere classified: Secondary | ICD-10-CM | POA: Diagnosis not present

## 2018-08-07 DIAGNOSIS — M25611 Stiffness of right shoulder, not elsewhere classified: Secondary | ICD-10-CM | POA: Diagnosis not present

## 2018-08-07 DIAGNOSIS — R29898 Other symptoms and signs involving the musculoskeletal system: Secondary | ICD-10-CM | POA: Diagnosis not present

## 2018-08-07 DIAGNOSIS — M25511 Pain in right shoulder: Secondary | ICD-10-CM | POA: Diagnosis not present

## 2018-08-19 DIAGNOSIS — R29898 Other symptoms and signs involving the musculoskeletal system: Secondary | ICD-10-CM | POA: Diagnosis not present

## 2018-08-19 DIAGNOSIS — M25611 Stiffness of right shoulder, not elsewhere classified: Secondary | ICD-10-CM | POA: Diagnosis not present

## 2018-08-19 DIAGNOSIS — M25511 Pain in right shoulder: Secondary | ICD-10-CM | POA: Diagnosis not present

## 2018-08-20 ENCOUNTER — Other Ambulatory Visit: Payer: Self-pay | Admitting: Internal Medicine

## 2018-12-03 IMAGING — DX DG FOOT COMPLETE 3+V*R*
3 series · 3 of 3 positions shown · non-contrast
Comparison: 01/27/2016

CLINICAL DATA: RIGHT foot pain for 2 months

EXAM:
RIGHT FOOT COMPLETE - 3+ VIEW

[foot ap]
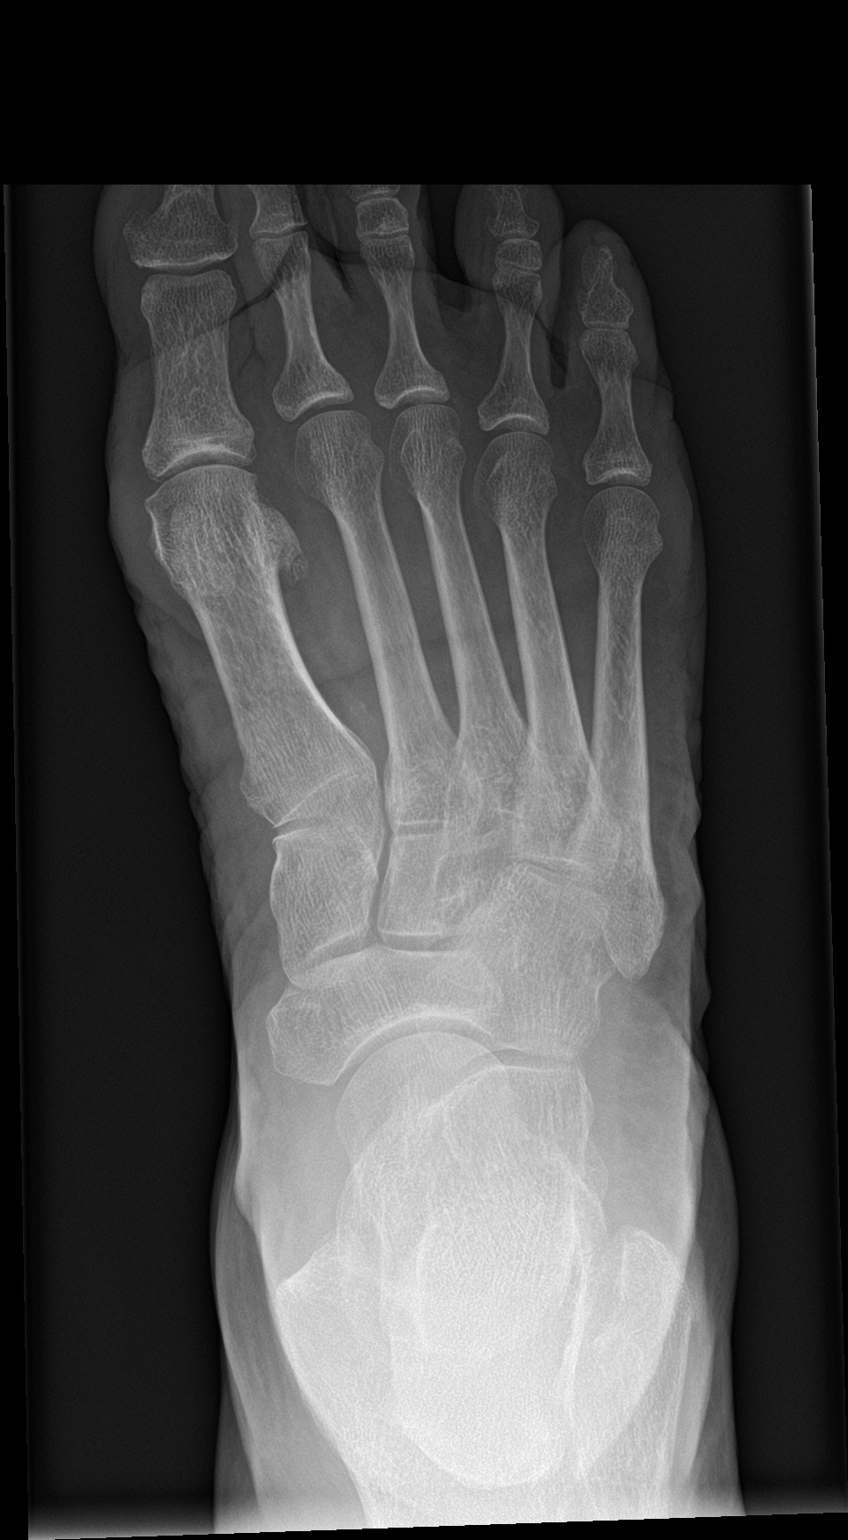

[foot obl]
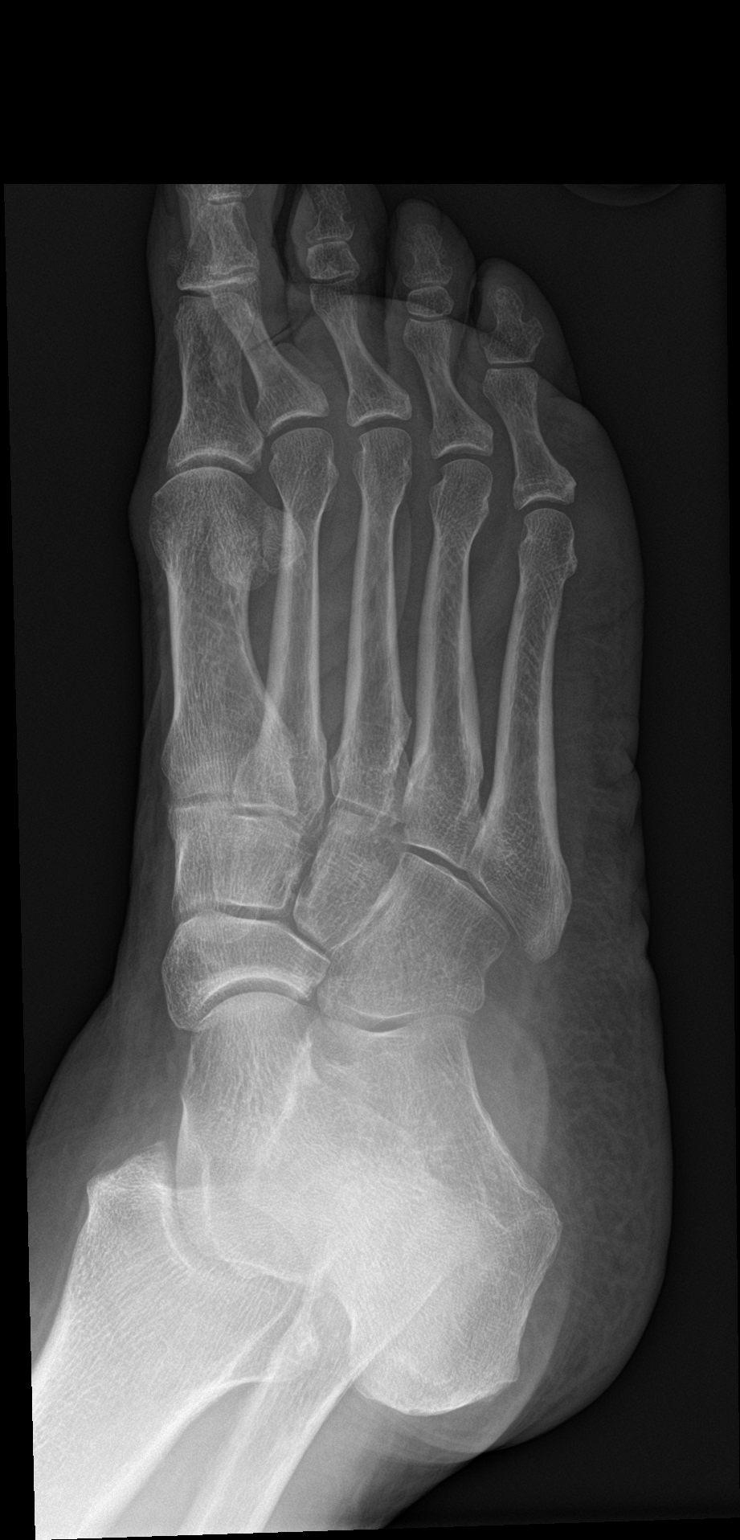

[foot lat]
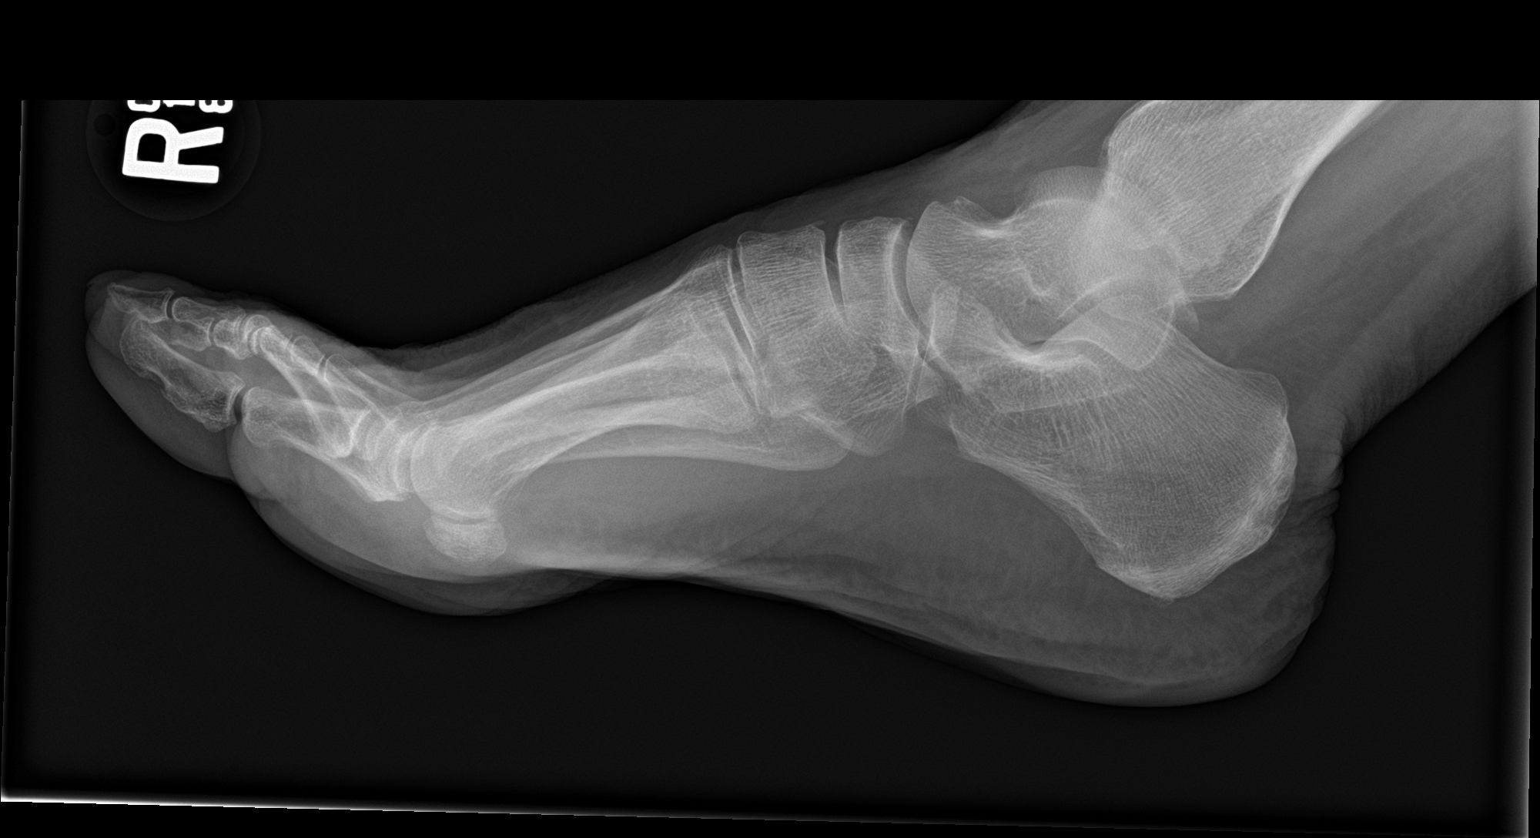

[3 of 3 positions shown; findings below may reference images not displayed]

FINDINGS: Osseous demineralization.

Joint spaces preserved.

No acute fracture, dislocation, or bone destruction.
IMPRESSION: No acute osseous abnormalities.

## 2018-12-12 DIAGNOSIS — Z961 Presence of intraocular lens: Secondary | ICD-10-CM | POA: Diagnosis not present

## 2018-12-12 DIAGNOSIS — H43813 Vitreous degeneration, bilateral: Secondary | ICD-10-CM | POA: Diagnosis not present

## 2018-12-12 DIAGNOSIS — H35033 Hypertensive retinopathy, bilateral: Secondary | ICD-10-CM | POA: Diagnosis not present

## 2018-12-12 DIAGNOSIS — H04123 Dry eye syndrome of bilateral lacrimal glands: Secondary | ICD-10-CM | POA: Diagnosis not present

## 2018-12-16 ENCOUNTER — Telehealth: Payer: Self-pay

## 2018-12-16 DIAGNOSIS — H6120 Impacted cerumen, unspecified ear: Secondary | ICD-10-CM

## 2018-12-16 NOTE — Telephone Encounter (Signed)
Copied from [place] [id number]. Topic: Referral - Request for Referral >> Dec 16, 2018  9:33 AM Sheran Luz wrote: Has patient seen PCP for this complaint?Yes  Referral for which specialty: ENT  [facility] provider/office: [facility], [place], [employer].  Reason for referral: muffled ear- feels like something in it

## 2018-12-16 NOTE — Telephone Encounter (Signed)
Referral placed.

## 2019-01-29 DIAGNOSIS — H6122 Impacted cerumen, left ear: Secondary | ICD-10-CM | POA: Diagnosis not present

## 2019-02-09 DIAGNOSIS — M9901 Segmental and somatic dysfunction of cervical region: Secondary | ICD-10-CM | POA: Diagnosis not present

## 2019-02-09 DIAGNOSIS — M5134 Other intervertebral disc degeneration, thoracic region: Secondary | ICD-10-CM | POA: Diagnosis not present

## 2019-02-09 DIAGNOSIS — M5032 Other cervical disc degeneration, mid-cervical region, unspecified level: Secondary | ICD-10-CM | POA: Diagnosis not present

## 2019-02-09 DIAGNOSIS — M9902 Segmental and somatic dysfunction of thoracic region: Secondary | ICD-10-CM | POA: Diagnosis not present

## 2019-02-09 DIAGNOSIS — M9903 Segmental and somatic dysfunction of lumbar region: Secondary | ICD-10-CM | POA: Diagnosis not present

## 2019-03-25 ENCOUNTER — Telehealth: Payer: Self-pay | Admitting: Internal Medicine

## 2019-03-25 ENCOUNTER — Ambulatory Visit (HOSPITAL_BASED_OUTPATIENT_CLINIC_OR_DEPARTMENT_OTHER)
Admission: RE | Admit: 2019-03-25 | Discharge: 2019-03-25 | Disposition: A | Discharge status: Home / Self Care | Payer: Medicare Other | Source: Ambulatory Visit | Attending: Internal Medicine | Admitting: Internal Medicine

## 2019-03-25 ENCOUNTER — Other Ambulatory Visit: Payer: Self-pay

## 2019-03-25 DIAGNOSIS — M8580 Other specified disorders of bone density and structure, unspecified site: Secondary | ICD-10-CM

## 2019-03-25 DIAGNOSIS — M47812 Spondylosis without myelopathy or radiculopathy, cervical region: Secondary | ICD-10-CM | POA: Diagnosis not present

## 2019-03-25 DIAGNOSIS — E559 Vitamin D deficiency, unspecified: Secondary | ICD-10-CM

## 2019-03-25 DIAGNOSIS — M47816 Spondylosis without myelopathy or radiculopathy, lumbar region: Secondary | ICD-10-CM | POA: Diagnosis not present

## 2019-03-25 DIAGNOSIS — M479 Spondylosis, unspecified: Secondary | ICD-10-CM | POA: Diagnosis not present

## 2019-03-25 DIAGNOSIS — M47814 Spondylosis without myelopathy or radiculopathy, thoracic region: Secondary | ICD-10-CM | POA: Diagnosis not present

## 2019-03-25 NOTE — Telephone Encounter (Signed)
Okay to order x-rays: Cervical, thoracic and lumbar spine. Please confirm with the patient the diagnosis, I assume is DJD, pain.  Will forward results to Dr. Raynelle Chary

## 2019-03-25 NOTE — Telephone Encounter (Signed)
Please advise 

## 2019-03-25 NOTE — Telephone Encounter (Signed)
[facility] advised patient to contact PCP and ask for the following orders to be placed with the imaging orders for: x ray of neck and upper and lower spine, patient requesting a follow up call.

## 2019-03-25 NOTE — Telephone Encounter (Signed)
Spoke w/ Pt- informed that order for x-rays have been placed. She will come by at her convenience and we will forward to Dr. Raynelle Chary.

## 2019-03-26 NOTE — Addendum Note (Signed)
Addended byDamita Dunnings D on: 03/26/2019 10:02 AM   Modules accepted: Orders

## 2019-04-01 DIAGNOSIS — M5032 Other cervical disc degeneration, mid-cervical region, unspecified level: Secondary | ICD-10-CM | POA: Diagnosis not present

## 2019-04-01 DIAGNOSIS — M5134 Other intervertebral disc degeneration, thoracic region: Secondary | ICD-10-CM | POA: Diagnosis not present

## 2019-04-01 DIAGNOSIS — M9902 Segmental and somatic dysfunction of thoracic region: Secondary | ICD-10-CM | POA: Diagnosis not present

## 2019-04-01 DIAGNOSIS — M9901 Segmental and somatic dysfunction of cervical region: Secondary | ICD-10-CM | POA: Diagnosis not present

## 2019-04-01 DIAGNOSIS — M9903 Segmental and somatic dysfunction of lumbar region: Secondary | ICD-10-CM | POA: Diagnosis not present

## 2019-05-14 ENCOUNTER — Encounter: Payer: Self-pay | Admitting: Medical

## 2019-05-14 ENCOUNTER — Ambulatory Visit (INDEPENDENT_AMBULATORY_CARE_PROVIDER_SITE_OTHER): Payer: Medicare Other | Admitting: Medical

## 2019-05-14 DIAGNOSIS — R198 Other specified symptoms and signs involving the digestive system and abdomen: Secondary | ICD-10-CM | POA: Diagnosis not present

## 2019-05-14 DIAGNOSIS — R142 Eructation: Secondary | ICD-10-CM | POA: Diagnosis not present

## 2019-05-14 DIAGNOSIS — M25511 Pain in right shoulder: Secondary | ICD-10-CM | POA: Diagnosis not present

## 2019-05-14 DIAGNOSIS — R109 Unspecified abdominal pain: Secondary | ICD-10-CM

## 2019-05-14 DIAGNOSIS — K219 Gastro-esophageal reflux disease without esophagitis: Secondary | ICD-10-CM

## 2019-05-14 MED ORDER — FAMOTIDINE 20 MG PO TABS: 20.0000 mg | ORAL_TABLET | Freq: Two times a day (BID) | ORAL | 0 refills | Status: DC

## 2019-05-14 NOTE — Patient Instructions (Addendum)
You do appear to have probable gerd based on signs and symptoms. Will advise healthy diet and start famotadine. Rx called to pharmacy in [place] by MA. Notified pt med otc but maybe cheaper as rx.   If signs/symptoms worsen or change then ED evaluation.  Advised if some improvement but still lingering on return from [place] then would recommend labs, h pylor breath test and possible abdomen US. But provider in [place] could do as well before return if needed.  Follow up as needed.   Not shoulder pain reported may need work [place] well. Impossible to give opinion on this based on hx alone. Pain low level intermittent. Maybe represent pain with gi issue such as gallbladder disease?

## 2019-05-14 NOTE — Progress Notes (Signed)
Subjective:    Patient ID: Marie Hebert, female    DOB: 16-Nov-1932, 85 y.o.   MRN: [id number]  HPI  Virtual Visit via Telephone Note  I connected with [name] on 05/14/19 at 10:40 AM EST by telephone and verified that I am speaking with the correct person using two identifiers.  Location: Patient: [place]. Provider: office   I discussed the limitations, risks, security and privacy concerns of performing an evaluation and management service by telephone and the availability of in person appointments. I also discussed with the patient that there may be a patient responsible charge related to this service. The patient expressed understanding and agreed to proceed.      I discussed the assessment and treatment plan with the patient. The patient was provided an opportunity to ask questions and all were answered. The patient agreed with the plan and demonstrated an understanding of the instructions.   The patient was advised to call back or seek an in-person evaluation if the symptoms worsen or if the condition fails to improve as anticipated.  I provided 25 minutes of non-face-to-face time during this encounter.   Mackie Pai, PA-C   [place] is out of state. She states when she eats will get upset stomach and belching. She states occasional sour taste when she belches. No burning in her throat. She states epigastric discomfort after eating. Will lat 5 minutes or less. Pt has tried some kaopectate, apple cider vinegar and rolaids. Pt states she eats healthy. No uti s/s reported. No chest pain. No sob  States mostly keto diet. States. Eggs, cheese and yogurts. No fried foods.   Pt states started about 10-14 days.   Pt states 23 year ago had h pylori.  Pt states some right shoulder pain at time. Minimal on and off pain. Brief and random. No obvious pain presently.    Review of Systems  Constitutional: Negative for chills, fatigue and fever.   Respiratory: Negative for cough, chest tightness, shortness of breath and wheezing.   Cardiovascular: Negative for chest pain and palpitations.  Gastrointestinal: Positive for abdominal pain and nausea. Negative for abdominal distention, blood in stool, constipation, diarrhea and vomiting.       Mild. See hpi.  Musculoskeletal: Negative for back pain and myalgias.       Random area occasional rt shoulder pain. No specifically associated to abd pain.  Skin: Negative for rash.  Neurological: Negative for dizziness, tremors, speech difficulty, weakness, numbness and headaches.  Hematological: Negative for adenopathy. Does not bruise/bleed easily.  Psychiatric/Behavioral: Negative for behavioral problems and decreased concentration.    Past Medical History:  Diagnosis Date  . Back pain   . Dizziness   . Hypertension   . Palpitations      Social History   Socioeconomic History  . Marital status: Widowed    Spouse name: Not on file  . Number of children: 4  . Years of education: Not on file  . Highest education level: Not on file  Occupational History  . Occupation: n/a  Social Needs  . Financial resource strain: Not on file  . Food insecurity    Worry: Not on file    Inability: Not on file  . Transportation needs    Medical: Not on file    Non-medical: Not on file  Tobacco Use  . Smoking status: Never Smoker  . Smokeless tobacco: Never Used  Substance and Sexual Activity  . Alcohol use: No    Alcohol/week:  0.0 standard drinks  . Drug use: No  . Sexual activity: Not on file  Lifestyle  . Physical activity    Days per week: Not on file    Minutes per session: Not on file  . Stress: Not on file  Relationships  . Social Herbalist on phone: Not on file    Gets together: Not on file    Attends religious service: Not on file    Active member of club or organization: Not on file    Attends meetings of clubs or organizations: Not on file    Relationship status:  Not on file  . Intimate partner violence    Fear of current or ex partner: Not on file    Emotionally abused: Not on file    Physically abused: Not on file    Forced sexual activity: Not on file  Other Topics Concern  . Not on file  Social History Narrative   Widower   Still drives   Lives by herself      Past Surgical History:  Procedure Laterality Date  . CATARACT EXTRACTION  2010   right  . RETINAL LASER PROCEDURE      Family History  Problem Relation Age of Onset  . Cancer Father   . CAD Neg Hx   . Colon cancer Neg Hx   . Breast cancer Neg Hx     Allergies  Allergen Reactions  . Aspirin   . Ibuprofen Other (See Comments)    Current Outpatient Medications on File Prior to Visit  Medication Sig Dispense Refill  . Cholecalciferol (VITAMIN D) 1000 UNITS capsule Take by mouth 2 (two) times daily.     . Coenzyme Q10 100 MG capsule Take by mouth.    . diltiazem (CARDIZEM CD) 120 MG 24 hr capsule Take 1 capsule (120 mg total) by mouth daily. 90 capsule 3  . Magnesium 500 MG CAPS Take 1 capsule by mouth daily.      . Multiple Vitamin (THERA) TABS Take by mouth.    . Omega-3 Fatty Acids (FISH OIL) 1000 MG CAPS Take 1 capsule by mouth daily.      . vitamin C (ASCORBIC ACID) 500 MG tablet Take 500 mg by mouth daily.     No current facility-administered medications on file prior to visit.     There were no vitals taken for this visit.      Objective:   Physical Exam        Assessment & Plan:  [phone] [place] Asked jasmine to call in famotadine 20 mg twice daily #60  You do appear to have probable gerd based on signs and symptoms. Will advise healthy diet and start famotadine. Rx called to pharmacy in [place] by MA. Notified pt med otc but maybe cheaper as rx.   If signs/symptoms worsen or change then ED evaluation.  Advised if some improvement but still lingering on return from [place] then would recommend labs, h pylor  breath test and possible abdomen US. But provider in [place] could do as well before return if needed.  Follow up as needed.  [name], PA-C   25 minutes spent with pt. 50% of time spent counseling on plan going forward and answering questins.

## 2019-06-01 ENCOUNTER — Ambulatory Visit (INDEPENDENT_AMBULATORY_CARE_PROVIDER_SITE_OTHER): Payer: Medicare Other | Admitting: Internal Medicine

## 2019-06-01 ENCOUNTER — Encounter: Payer: Self-pay | Admitting: Internal Medicine

## 2019-06-01 ENCOUNTER — Other Ambulatory Visit: Payer: Self-pay

## 2019-06-01 ENCOUNTER — Telehealth: Payer: Self-pay

## 2019-06-01 VITALS — BP 133/59 | HR 75 | Temp 96.8°F | Resp 16 | Ht 61.0 in | Wt 173.2 lb

## 2019-06-01 DIAGNOSIS — R1013 Epigastric pain: Secondary | ICD-10-CM | POA: Diagnosis not present

## 2019-06-01 LAB — CBC WITH DIFFERENTIAL/PLATELET
Basophils Absolute: 0.1 10*3/uL (ref 0.0–0.1)
Basophils Relative: 1.5 % (ref 0.0–3.0)
Eosinophils Absolute: 0.2 10*3/uL (ref 0.0–0.7)
Eosinophils Relative: 4.1 % (ref 0.0–5.0)
HCT: 43.1 % (ref 36.0–46.0)
Hemoglobin: 14 g/dL (ref 12.0–15.0)
Lymphocytes Relative: 40.1 % (ref 12.0–46.0)
Lymphs Abs: 2.3 10*3/uL (ref 0.7–4.0)
MCHC: 32.4 g/dL (ref 30.0–36.0)
MCV: 92.6 fl (ref 78.0–100.0)
Monocytes Absolute: 0.5 10*3/uL (ref 0.1–1.0)
Monocytes Relative: 8.4 % (ref 3.0–12.0)
Neutro Abs: 2.6 10*3/uL (ref 1.4–7.7)
Neutrophils Relative %: 45.9 % (ref 43.0–77.0)
Platelets: 206 10*3/uL (ref 150.0–400.0)
RBC: 4.66 Mil/uL (ref 3.87–5.11)
RDW: 14.9 % (ref 11.5–15.5)
WBC: 5.7 10*3/uL (ref 4.0–10.5)

## 2019-06-01 LAB — COMPREHENSIVE METABOLIC PANEL
ALT: 12 U/L (ref 0–35)
AST: 19 U/L (ref 0–37)
Albumin: 4 g/dL (ref 3.5–5.2)
Alkaline Phosphatase: 60 U/L (ref 39–117)
BUN: 15 mg/dL (ref 6–23)
CO2: 26 mEq/L (ref 19–32)
Calcium: 8.9 mg/dL (ref 8.4–10.5)
Chloride: 104 mEq/L (ref 96–112)
Creatinine, Ser: 0.58 mg/dL (ref 0.40–1.20)
GFR: 98.94 mL/min (ref >60.00)
Glucose, Bld: 88 mg/dL (ref 70–99)
Potassium: 3.8 mEq/L (ref 3.5–5.1)
Sodium: 138 mEq/L (ref 135–145)
Total Bilirubin: 0.4 mg/dL (ref 0.2–1.2)
Total Protein: 6.3 g/dL (ref 6.0–8.3)

## 2019-06-01 MED ORDER — PANTOPRAZOLE SODIUM 40 MG PO TBEC: 40.0000 mg | DELAYED_RELEASE_TABLET | Freq: Every day | ORAL | 1 refills | Status: DC

## 2019-06-01 NOTE — Telephone Encounter (Signed)
Copied from [place] [id number]. Topic: Appointment Scheduling - Scheduling Inquiry for Clinic >> Jun 01, 2019  8:50 AM Richardo Priest, NT wrote: Reason for CRM: Pt called in stating she would like to be seen today with PCP in regards to acid reflux for about a month. Pt did not want any other appointment time. Attempted scheduling line 3x. Please advise.

## 2019-06-01 NOTE — Patient Instructions (Addendum)
GO TO THE LAB : Get the blood work    GO TO THE FRONT DESK Schedule your next appointment   for physical exam in 1 month  Stop by the first floor, schedule the ultrasound of your abdomen  Start pantoprazole: 1 tablet before breakfast, this is an acid reducer

## 2019-06-01 NOTE — Progress Notes (Signed)
Pre visit review using our clinic review tool, if applicable. No additional management support is needed unless otherwise documented below in the visit note. 

## 2019-06-01 NOTE — Progress Notes (Signed)
Subjective:    Patient ID: Marie Hebert, female    DOB: 1933-03-12, 85 y.o.   MRN: [id number]  DOS:  06/01/2019 Type of visit - description: Acute Symptoms started about 6 to 8 weeks ago: From time to time she burps typically after eating. Symptoms are not consistent every time she eats and they are not described as pain. She was recently seen and tried on famotidine but that did not help. She is concerned about her gallbladder. Denies classic heartburn type of symptoms  Review of Systems Denies dysphagia or odynophagia No taking NSAIDs No fever chills or weight loss No nausea, vomiting, blood in the stools or change in the color of the stools She is active, takes long walks and experienced no chest pain or difficulty breathing while exercising.  Past Medical History:  Diagnosis Date  . Back pain   . Dizziness   . Hypertension   . Palpitations     Past Surgical History:  Procedure Laterality Date  . CATARACT EXTRACTION  2010   right  . RETINAL LASER PROCEDURE      Social History   Socioeconomic History  . Marital status: Widowed    Spouse name: Not on file  . Number of children: 4  . Years of education: Not on file  . Highest education level: Not on file  Occupational History  . Occupation: n/a  Social Needs  . Financial resource strain: Not on file  . Food insecurity    Worry: Not on file    Inability: Not on file  . Transportation needs    Medical: Not on file    Non-medical: Not on file  Tobacco Use  . Smoking status: Never Smoker  . Smokeless tobacco: Never Used  Substance and Sexual Activity  . Alcohol use: No    Alcohol/week: 0.0 standard drinks  . Drug use: No  . Sexual activity: Not on file  Lifestyle  . Physical activity    Days per week: Not on file    Minutes per session: Not on file  . Stress: Not on file  Relationships  . Social Herbalist on phone: Not on file    Gets together: Not on file    Attends religious  service: Not on file    Active member of club or organization: Not on file    Attends meetings of clubs or organizations: Not on file    Relationship status: Not on file  . Intimate partner violence    Fear of current or ex partner: Not on file    Emotionally abused: Not on file    Physically abused: Not on file    Forced sexual activity: Not on file  Other Topics Concern  . Not on file  Social History Narrative   Widower   Still drives   Lives by herself        Allergies as of 06/01/2019      Reactions   Aspirin    Ibuprofen Other (See Comments)      Medication List       Accurate as of June 01, 2019 11:23 AM. If you have any questions, ask your nurse or doctor.        Coenzyme Q10 100 MG capsule Take by mouth.   diltiazem 120 MG 24 hr capsule Commonly known as: CARDIZEM CD Take 1 capsule (120 mg total) by mouth daily.   famotidine 20 MG tablet Commonly known as: PEPCID Take 1 tablet (20  mg total) by mouth 2 (two) times daily.   Fish Oil 1000 MG Caps Take 1 capsule by mouth daily.   Magnesium 500 MG Caps Take 1 capsule by mouth daily.   Thera Tabs Take by mouth.   vitamin C 500 MG tablet Commonly known as: ASCORBIC ACID Take 500 mg by mouth daily.   Vitamin D 1000 units capsule Take by mouth 2 (two) times daily.           Objective:   Physical Exam BP (!) 133/59 (BP Location: Left Arm, Patient Position: Sitting, Cuff Size: Normal)   Pulse 75   Temp (!) 96.8 F (36 C) (Temporal)   Resp 16   Ht 5\' 1"  (1.549 m)   Wt 173 lb 4 oz (78.6 kg)   SpO2 100%   BMI 32.74 kg/m  General:   Well developed, NAD, BMI noted.  HEENT:  Normocephalic . Face symmetric, atraumatic Neck: No neck or supraclavicular mass Lungs:  CTA B Normal respiratory effort, no intercostal retractions, no accessory muscle use. Heart: RRR,  no murmur.  no pretibial edema bilaterally  Abdomen:  Not distended, soft, non-tender. No rebound or rigidity.   Skin: Not pale.  Not jaundice Neurologic:  alert & oriented X3.  Speech normal, gait appropriate for age and unassisted Psych--  Cognition and judgment appear intact.  Cooperative with normal attention span and concentration.  Behavior appropriate. No anxious or depressed appearing.     Assessment     Assessment  HTN (h/o palpitations, on cardiazem) DJD Recurrent UTIs; extensive urology w/u  2015:atrophic vaginitis, eventually decided to try hormonal creams Back pain, chronic, needs a disability parking  Palpitations: Saw cardiology 05-2017: Holter, echo, stress echo essentially negative Osteopenia: T score -1.5 (02/2016)  PLAN: Dyspepsia: As described above, new onset, she is 86, did not respond well to famotidine. Exam is benign and  review of systems with no red flags. We will get a CMP, CBC and gallbladder ultrasound although symptoms are not classic for cholelithiasis. Rx pantoprazole. If symptoms are not quickly responding, she will need further evaluation, likely GI referral, H. pylori testing, etc. RTC 1 month for CPX   This visit occurred during the SARS-CoV-2 public health emergency.  Safety protocols were in place, including screening questions prior to the visit, additional usage of staff PPE, and extensive cleaning of exam room while observing appropriate contact time as indicated for disinfecting solutions.

## 2019-06-02 NOTE — Assessment & Plan Note (Signed)
Dyspepsia: As described above, new onset, she is 37, did not respond well to famotidine. Exam is benign and  review of systems with no red flags. We will get a CMP, CBC and gallbladder ultrasound although symptoms are not classic for cholelithiasis. Rx pantoprazole. If symptoms are not quickly responding, she will need further evaluation, likely GI referral, H. pylori testing, etc. RTC 1 month for CPX

## 2019-06-04 ENCOUNTER — Other Ambulatory Visit: Payer: Self-pay

## 2019-06-04 ENCOUNTER — Ambulatory Visit (HOSPITAL_BASED_OUTPATIENT_CLINIC_OR_DEPARTMENT_OTHER)
Admission: RE | Admit: 2019-06-04 | Discharge: 2019-06-04 | Disposition: A | Discharge status: Home / Self Care | Payer: Medicare Other | Source: Ambulatory Visit | Attending: Internal Medicine | Admitting: Internal Medicine

## 2019-06-04 DIAGNOSIS — R1013 Epigastric pain: Secondary | ICD-10-CM | POA: Insufficient documentation

## 2019-06-04 DIAGNOSIS — K30 Functional dyspepsia: Secondary | ICD-10-CM | POA: Diagnosis not present

## 2019-06-05 ENCOUNTER — Other Ambulatory Visit: Payer: Self-pay | Admitting: Medical

## 2019-06-11 DIAGNOSIS — Z719 Counseling, unspecified: Secondary | ICD-10-CM | POA: Diagnosis not present

## 2019-06-23 ENCOUNTER — Other Ambulatory Visit: Payer: Self-pay | Admitting: Internal Medicine

## 2019-07-01 ENCOUNTER — Other Ambulatory Visit: Payer: Self-pay

## 2019-07-02 ENCOUNTER — Ambulatory Visit: Payer: Medicare Other | Admitting: Internal Medicine

## 2019-07-03 ENCOUNTER — Ambulatory Visit: Payer: Medicare Other | Admitting: Internal Medicine

## 2019-07-06 ENCOUNTER — Other Ambulatory Visit: Payer: Self-pay

## 2019-07-07 ENCOUNTER — Other Ambulatory Visit: Payer: Self-pay

## 2019-07-07 ENCOUNTER — Ambulatory Visit (INDEPENDENT_AMBULATORY_CARE_PROVIDER_SITE_OTHER): Payer: Medicare Other | Admitting: Internal Medicine

## 2019-07-07 ENCOUNTER — Encounter: Payer: Self-pay | Admitting: Internal Medicine

## 2019-07-07 VITALS — BP 156/56 | HR 88 | Temp 96.8°F | Resp 12 | Ht 61.0 in | Wt 175.0 lb

## 2019-07-07 DIAGNOSIS — Z0000 Encounter for general adult medical examination without abnormal findings: Secondary | ICD-10-CM

## 2019-07-07 NOTE — Patient Instructions (Signed)
   GO TO THE FRONT DESK Schedule your next appointment   For a physical exam in 1 year     Check the  blood pressure  weekly   BP GOAL is between 110/65 and  135/85. If it is consistently higher or lower, let me know

## 2019-07-07 NOTE — Progress Notes (Signed)
Subjective:    Patient ID: Marie Hebert, female    DOB: 1933/01/20, 86 y.o.   MRN: [id number]  DOS:  07/07/2019 Type of visit - description: CPX In general feeling great  BP Readings from Last 3 Encounters:  07/07/19 (!) 156/56  06/01/19 (!) 133/59  05/07/18 126/70    Wt Readings from Last 3 Encounters:  07/07/19 175 lb (79.4 kg)  06/01/19 173 lb 4 oz (78.6 kg)  05/07/18 168 lb (76.2 kg)      Review of Systems Was seen with dyspepsia, improved. occasional back pain mostly when she leans against her back.  No symptoms with moving or lifting. Occasional feels like the left ear has water in it, no decreased hearing or tinnitus.  No discharge or bleeding.  Other than above, a 14 point review of systems is negative    Past Medical History:  Diagnosis Date  . Back pain   . Dizziness   . Hypertension   . Palpitations     Past Surgical History:  Procedure Laterality Date  . CATARACT EXTRACTION  2010   right  . RETINAL LASER PROCEDURE      Social History   Socioeconomic History  . Marital status: Widowed    Spouse name: Not on file  . Number of children: 4  . Years of education: Not on file  . Highest education level: Not on file  Occupational History  . Occupation: n/a  Tobacco Use  . Smoking status: Never Smoker  . Smokeless tobacco: Never Used  Substance and Sexual Activity  . Alcohol use: No    Alcohol/week: 0.0 standard drinks  . Drug use: No  . Sexual activity: Not on file  Other Topics Concern  . Not on file  Social History Narrative   Widower   Still drives   Lives by herself     Social Determinants of Health   Financial Resource Strain:   . Difficulty of Paying Living Expenses: Not on file  Food Insecurity:   . Worried About Charity fundraiser in the Last Year: Not on file  . Ran Out of Food in the Last Year: Not on file  Transportation Needs:   . Lack of Transportation (Medical): Not on file  . Lack of Transportation  (Non-Medical): Not on file  Physical Activity:   . Days of Exercise per Week: Not on file  . Minutes of Exercise per Session: Not on file  Stress:   . Feeling of Stress : Not on file  Social Connections:   . Frequency of Communication with Friends and Family: Not on file  . Frequency of Social Gatherings with Friends and Family: Not on file  . Attends Religious Services: Not on file  . Active Member of Clubs or Organizations: Not on file  . Attends Archivist Meetings: Not on file  . Marital Status: Not on file  Intimate Partner Violence:   . Fear of Current or Ex-Partner: Not on file  . Emotionally Abused: Not on file  . Physically Abused: Not on file  . Sexually Abused: Not on file     Family History  Problem Relation Age of Onset  . Cancer Father   . Diabetes Son   . CAD Neg Hx   . Colon cancer Neg Hx   . Breast cancer Neg Hx      Allergies as of 07/07/2019      Reactions   Aspirin    Ibuprofen Other (See Comments)  Medication List       Accurate as of July 07, 2019 11:59 PM. If you have any questions, ask your nurse or doctor.        STOP taking these medications   pantoprazole 40 MG tablet Commonly known as: PROTONIX Stopped by: Kathlene November, MD     TAKE these medications   Coenzyme Q10 100 MG capsule Take by mouth.   diltiazem 120 MG 24 hr capsule Commonly known as: CARDIZEM CD Take 1 capsule (120 mg total) by mouth daily.   Fish Oil 1000 MG Caps Take 1 capsule by mouth daily.   Magnesium 500 MG Caps Take 1 capsule by mouth daily.   Thera Tabs Take by mouth.   vitamin C 500 MG tablet Commonly known as: ASCORBIC ACID Take 500 mg by mouth daily.   Vitamin D 1000 units capsule Take by mouth 2 (two) times daily.           Objective:   Physical Exam BP (!) 156/56 (BP Location: Left Arm, Cuff Size: Large)   Pulse 88   Temp (!) 96.8 F (36 C) (Temporal)   Resp 12   Ht 5\' 1"  (1.549 m)   Wt 175 lb (79.4 kg)   SpO2 100%    BMI 33.07 kg/m  General: Well developed, NAD, BMI noted Neck: No  thyromegaly  HEENT:  Normocephalic . Face symmetric, atraumatic Lungs:  CTA B Normal respiratory effort, no intercostal retractions, no accessory muscle use. Heart: RRR,  no murmur.  No pretibial edema bilaterally  Abdomen:  Not distended, soft, non-tender. No rebound or rigidity.   Skin: Exposed areas without rash. Not pale. Not jaundice Neurologic:  alert & oriented X3.  Speech normal, gait appropriate for age and unassisted Strength symmetric and appropriate for age.  Psych: Cognition and judgment appear intact.  Cooperative with normal attention span and concentration.  Behavior appropriate. No anxious or depressed appearing.     Assessment    Assessment  HTN (h/o palpitations, on cardiazem) DJD Recurrent UTIs; extensive urology w/u  2015:atrophic vaginitis, eventually decided to try hormonal creams Back pain, chronic, needs a disability parking  Palpitations: Saw cardiology 05-2017: Holter, echo, stress echo essentially negative Osteopenia: T score -1.5 (02/2016)  PLAN: Here for CPX Dyspepsia: Since the last visit, blood work and ultrasound of the abdomen were normal.  She took PPIs for 1 month, then stop, does not like to take more medication, symptoms are "much   better".  Plan is to call me if symptoms resurface HTN: BP slightly elevated, on Cardizem, ambulatory BPs are always normal.  No change Osteopenia: She is active and take supplements, declines to recheck a bone density test RTC 1 year     This visit occurred during the SARS-CoV-2 public health emergency.  Safety protocols were in place, including screening questions prior to the visit, additional usage of staff PPE, and extensive cleaning of exam room while observing appropriate contact time as indicated for disinfecting solutions.

## 2019-07-08 NOTE — Assessment & Plan Note (Signed)
Here for CPX Dyspepsia: Since the last visit, blood work and ultrasound of the abdomen were normal.  She took PPIs for 1 month, then stop, does not like to take more medication, symptoms are "much   better".  Plan is to call me if symptoms resurface HTN: BP slightly elevated, on Cardizem, ambulatory BPs are always normal.  No change Osteopenia: She is active and take supplements, declines to recheck a bone density test RTC 1 year

## 2019-07-08 NOTE — Assessment & Plan Note (Signed)
Td 2009; again declines any and all shots or screening procedures. Labs: Recent labs normal, declined to do additional blood work such FLP etc. Lifestyle remains very good.  She is active, trying to eat healthy, takes supplements.

## 2019-08-07 ENCOUNTER — Other Ambulatory Visit: Payer: Self-pay | Admitting: Internal Medicine

## 2019-09-01 ENCOUNTER — Telehealth: Payer: Self-pay | Admitting: Internal Medicine

## 2019-09-01 DIAGNOSIS — M25562 Pain in left knee: Secondary | ICD-10-CM

## 2019-09-01 NOTE — Telephone Encounter (Signed)
Referral placed to [facility].

## 2019-09-01 NOTE — Telephone Encounter (Signed)
Pt states she recently had a visit with [facility]. She wants to know it he can refer her to an orthopedic specialist.  Left knee concerns. Pt requ to be contacted once referral is entered.

## 2019-09-09 ENCOUNTER — Ambulatory Visit (INDEPENDENT_AMBULATORY_CARE_PROVIDER_SITE_OTHER): Payer: Medicare Other | Admitting: Orthopedic Surgery

## 2019-09-09 ENCOUNTER — Other Ambulatory Visit: Payer: Self-pay

## 2019-09-09 ENCOUNTER — Ambulatory Visit: Payer: Self-pay

## 2019-09-09 DIAGNOSIS — M25562 Pain in left knee: Secondary | ICD-10-CM

## 2019-09-11 ENCOUNTER — Encounter: Payer: Self-pay | Admitting: Orthopedic Surgery

## 2019-09-11 NOTE — Progress Notes (Signed)
Office Visit Note   Patient: Marie Hebert           Date of Birth: 03/03/1933           MRN: [id number] Visit Date: 09/09/2019 Requested by: Colon Branch, [address] [place],  [place] [address] PCP: Colon Branch, MD  Subjective: Chief Complaint  Patient presents with  . Left Knee - Pain    HPI: Patient presents for evaluation of left knee pain.  She had a meniscal tear 8 years ago and was treated with 5 sessions of laser treatment in [place] which helped.  Now she reports some recurrent pain.  She describes some weakness and giving way.  Uses topical agent.  Brace helps.  Denies any groin pain.  She does enjoy walking for exercise.  No prior surgery on the knee.  She is from [place].              ROS: All systems reviewed are negative as they relate to the chief complaint within the history of present illness.  Patient denies  fevers or chills.   Assessment & Plan: Visit Diagnoses:  1. Left knee pain, unspecified chronicity     Plan: Impression is left knee pain with mild arthritis in the medial compartment.  We talked about injection but she wants to hold off on that intervention for now.  Rena continue with brace treatment as well as change her exercise regimen to do less loadbearing and more quad strengthening.  I think if her symptoms worsen she is encouraged to come back for an injection which I think would give her temporary but potentially mildly sustained relief for period of months.  She will follow-up as needed.  Follow-Up Instructions: Return if symptoms worsen or fail to improve.   Orders:  Orders Placed This Encounter  Procedures  . XR KNEE 3 VIEW LEFT   No orders of the defined types were placed in this encounter.     Procedures: No procedures performed   Clinical Data: No additional findings.  Objective: Vital Signs: There were no vitals taken for this visit.  Physical Exam:   Constitutional: Patient appears well-developed  HEENT:  Head: Normocephalic Eyes:EOM are normal Neck: Normal range of motion Cardiovascular: Normal rate Pulmonary/chest: Effort normal Neurologic: Patient is alert Skin: Skin is warm Psychiatric: Patient has normal mood and affect    Ortho Exam: Ortho exam demonstrates full active and passive range of motion of that left knee with trace effusion.  Medial greater than lateral joint line tenderness.  Collateral crucial ligaments are stable.  Pedal pulses palpable.  No groin pain with internal extra rotation of the leg.  No masses lymphadenopathy or skin changes noted in that left knee region.  Specifically no definite Baker's cyst palpable.  Specialty Comments:  No specialty comments available.  Imaging: No results found.   PMFS History: Patient Active Problem List   Diagnosis Date Noted  . Essential hypertension 07/12/2017  . PAC (premature atrial contraction) 07/12/2017  . Mitral regurgitation 07/12/2017  . Chest discomfort 06/13/2017  . Onychomycosis 08/10/2015  . Pain in lower limb 08/10/2015  . PCP NOTES>>> 04/06/2015  . Skin lesion 03/18/2013  . Annual physical exam 12/31/2011  . DJD (degenerative joint disease) 12/31/2011  . Vitamin D deficiency 12/31/2011  . Recurrent UTI 10/10/2011  . PALPITATIONS, OCCASIONAL 10/14/2007  . BACK PAIN 09/16/2007   Past Medical History:  Diagnosis Date  . Back pain   .  Dizziness   . Hypertension   . Palpitations     Family History  Problem Relation Age of Onset  . Cancer Father   . Diabetes Son   . CAD Neg Hx   . Colon cancer Neg Hx   . Breast cancer Neg Hx     Past Surgical History:  Procedure Laterality Date  . CATARACT EXTRACTION  2010   right  . RETINAL LASER PROCEDURE     Social History   Occupational History  . Occupation: n/a  Tobacco Use  . Smoking status: Never Smoker  . Smokeless tobacco: Never Used  Substance and Sexual Activity  . Alcohol use: No    Alcohol/week: 0.0 standard drinks  . Drug use:  No  . Sexual activity: Not on file

## 2019-09-22 DIAGNOSIS — H0015 Chalazion left lower eyelid: Secondary | ICD-10-CM | POA: Diagnosis not present

## 2019-09-22 DIAGNOSIS — H04123 Dry eye syndrome of bilateral lacrimal glands: Secondary | ICD-10-CM | POA: Diagnosis not present

## 2019-10-21 DIAGNOSIS — H0015 Chalazion left lower eyelid: Secondary | ICD-10-CM | POA: Diagnosis not present

## 2019-10-26 DIAGNOSIS — H90A32 Mixed conductive and sensorineural hearing loss, unilateral, left ear with restricted hearing on the contralateral side: Secondary | ICD-10-CM | POA: Diagnosis not present

## 2019-10-26 DIAGNOSIS — H9190 Unspecified hearing loss, unspecified ear: Secondary | ICD-10-CM | POA: Diagnosis not present

## 2019-10-26 DIAGNOSIS — H9312 Tinnitus, left ear: Secondary | ICD-10-CM | POA: Diagnosis not present

## 2019-10-26 DIAGNOSIS — H9212 Otorrhea, left ear: Secondary | ICD-10-CM | POA: Diagnosis not present

## 2019-10-26 DIAGNOSIS — H90A21 Sensorineural hearing loss, unilateral, right ear, with restricted hearing on the contralateral side: Secondary | ICD-10-CM | POA: Diagnosis not present

## 2019-11-02 DIAGNOSIS — L84 Corns and callosities: Secondary | ICD-10-CM | POA: Diagnosis not present

## 2019-11-02 DIAGNOSIS — M79675 Pain in left toe(s): Secondary | ICD-10-CM | POA: Diagnosis not present

## 2019-11-02 DIAGNOSIS — M79674 Pain in right toe(s): Secondary | ICD-10-CM | POA: Diagnosis not present

## 2019-11-24 ENCOUNTER — Telehealth: Payer: Self-pay

## 2019-11-24 NOTE — Telephone Encounter (Signed)
Patient called in to see if Dr. Larose Kells

## 2020-01-11 DIAGNOSIS — H353131 Nonexudative age-related macular degeneration, bilateral, early dry stage: Secondary | ICD-10-CM | POA: Diagnosis not present

## 2020-01-11 DIAGNOSIS — H35033 Hypertensive retinopathy, bilateral: Secondary | ICD-10-CM | POA: Diagnosis not present

## 2020-01-11 DIAGNOSIS — H04123 Dry eye syndrome of bilateral lacrimal glands: Secondary | ICD-10-CM | POA: Diagnosis not present

## 2020-01-11 DIAGNOSIS — Z961 Presence of intraocular lens: Secondary | ICD-10-CM | POA: Diagnosis not present

## 2020-01-11 DIAGNOSIS — H0015 Chalazion left lower eyelid: Secondary | ICD-10-CM | POA: Diagnosis not present

## 2020-01-12 ENCOUNTER — Telehealth: Payer: Self-pay | Admitting: Internal Medicine

## 2020-01-12 NOTE — Telephone Encounter (Signed)
No interactions to my knowledge.

## 2020-01-12 NOTE — Telephone Encounter (Signed)
Patient states she recently saw Eye Doctor and was diagnosed with Macular Degeneration. She has questions about what vitamins may interact with the medication she was given, called PreserVision. Please call her to clarify, [phone]

## 2020-01-13 NOTE — Telephone Encounter (Signed)
Spoke w/ Pt- informed to PCP knowledge PreserVision should not interact with other medications. Pt verbalized understanding.

## 2020-01-22 ENCOUNTER — Ambulatory Visit (INDEPENDENT_AMBULATORY_CARE_PROVIDER_SITE_OTHER): Payer: Medicare Other | Admitting: Internal Medicine

## 2020-01-22 ENCOUNTER — Other Ambulatory Visit: Payer: Self-pay

## 2020-01-22 ENCOUNTER — Encounter: Payer: Self-pay | Admitting: Internal Medicine

## 2020-01-22 VITALS — BP 141/83 | HR 89 | Temp 98.5°F | Resp 16 | Ht 61.0 in | Wt 176.1 lb

## 2020-01-22 DIAGNOSIS — R1013 Epigastric pain: Secondary | ICD-10-CM

## 2020-01-22 DIAGNOSIS — R002 Palpitations: Secondary | ICD-10-CM

## 2020-01-22 NOTE — Progress Notes (Signed)
Pre visit review using our clinic review tool, if applicable. No additional management support is needed unless otherwise documented below in the visit note. 

## 2020-01-22 NOTE — Patient Instructions (Addendum)
Please schedule Medicare Wellness with Glenard Haring.   Let me know if your stomach problems continue after you stop PreserVision.  Call anytime if you have severe palpitations  Please consider taking the Covid vaccination

## 2020-01-22 NOTE — Progress Notes (Signed)
Subjective:    Patient ID: Marie Hebert, female    DOB: 04-14-1933, 86 y.o.   MRN: [id number]  DOS:  01/22/2020 Type of visit - description: Acute Last week, she started PreserVision under the advice of her ophthalmologist for macular degeneration. Since then, she is having upper abdominal discomfort described as fullness or feeling bloated. Denies nausea, vomiting or diarrhea.  No blood in the stools.  Also, from time to time she has palpitations (even before she started PreserVision). Episodes are short, not associated with dizziness or near fainting. When that happened she checked her blood pressure is normal and the heart rate is around 110 (her baseline  heart rate is in the 60s). No chest pain, shortness of breath, lower extremity edema. Denies anxiety or taking decongestants  Review of Systems See above   Past Medical History:  Diagnosis Date  . Back pain   . Dizziness   . Hypertension   . Palpitations     Past Surgical History:  Procedure Laterality Date  . CATARACT EXTRACTION  2010   right  . RETINAL LASER PROCEDURE      Allergies as of 01/22/2020      Reactions   Aspirin    Ibuprofen Other (See Comments)      Medication List       Accurate as of January 22, 2020  3:57 PM. If you have any questions, ask your nurse or doctor.        Coenzyme Q10 100 MG capsule Take by mouth.   diltiazem 120 MG 24 hr capsule Commonly known as: CARDIZEM CD Take 1 capsule (120 mg total) by mouth daily.   Fish Oil 1000 MG Caps Take 1 capsule by mouth daily.   Magnesium 500 MG Caps Take 1 capsule by mouth daily.   PRESERVISION AREDS 2 PO Take by mouth.   Thera Tabs Take by mouth.   vitamin C 500 MG tablet Commonly known as: ASCORBIC ACID Take 500 mg by mouth daily.   Vitamin D 1000 units capsule Take by mouth 2 (two) times daily.          Objective:   Physical Exam BP (!) 141/83 (BP Location: Left Arm, Patient Position: Sitting, Cuff Size: Small)    Pulse 89   Temp 98.5 F (36.9 C) (Oral)   Resp 16   Ht 5\' 1"  (1.549 m)   Wt 176 lb 2 oz (79.9 kg)   SpO2 98%   BMI 33.28 kg/m  General:   Well developed, NAD, BMI noted.  HEENT:  Normocephalic . Face symmetric, atraumatic Lungs:  CTA B Normal respiratory effort, no intercostal retractions, no accessory muscle use. Heart: RRR,  no murmur.  Abdomen:  Not distended, soft, non-tender. No rebound or rigidity.   Skin: Not pale. Not jaundice Lower extremities: no pretibial edema bilaterally  Neurologic:  alert & oriented X3.  Speech normal, gait appropriate for age and unassisted Psych--  Cognition and judgment appear intact.  Cooperative with normal attention span and concentration.  Behavior appropriate. No anxious or depressed appearing.     Assessment     Assessment  HTN (h/o palpitations, on cardiazem) DJD Recurrent UTIs; extensive urology w/u  2015:atrophic vaginitis, eventually decided to try hormonal creams Back pain, chronic, needs a disability parking  Palpitations: Saw cardiology 05-2017: Holter, echo, stress echo essentially negative Osteopenia: T score -1.5 (02/2016)  PLAN: Dyspepsia: Developed ill-defined upper abdominal discomfort after she started PreserVision. She has complained of dyspepsia, see to previous visit,  blood work and ultrasound of the abdomen were normal. Plan: Stop PreserVision, if symptoms persist let me know.  She will consider and GI referral Palpitations: As described above.  No red flag symptoms, history of palpitations before. EKG today: No acute changes, sinus rhythm, low voltage precordial leads, no major changes from previous EKGs. Reluctant to take Covid vaccination patient educated about pro>>cons of the vaccine patient will not proceed   This visit occurred during the SARS-CoV-2 public health emergency.  Safety protocols were in place, including screening questions prior to the visit, additional usage of staff PPE, and extensive  cleaning of exam room while observing appropriate contact time as indicated for disinfecting solutions.

## 2020-01-24 NOTE — Assessment & Plan Note (Signed)
Dyspepsia: Developed ill-defined upper abdominal discomfort after she started PreserVision. She has complained of dyspepsia, see to previous visit, blood work and ultrasound of the abdomen were normal. Plan: Stop PreserVision, if symptoms persist let me know.  She will consider and GI referral Palpitations: As described above.  No red flag symptoms, history of palpitations before. EKG today: No acute changes, sinus rhythm, low voltage precordial leads, no major changes from previous EKGs. Reluctant to take Covid vaccination patient educated about pro>>cons of the vaccine patient will not proceed

## 2020-03-25 ENCOUNTER — Telehealth: Payer: Self-pay | Admitting: Internal Medicine

## 2020-03-25 DIAGNOSIS — R1013 Epigastric pain: Secondary | ICD-10-CM

## 2020-03-25 NOTE — Telephone Encounter (Signed)
Last OV note reviewed.   Dyspepsia: Developed ill-defined upper abdominal discomfort after she started PreserVision. She has complained of dyspepsia, see to previous visit, blood work and ultrasound of the abdomen were normal. Plan: Stop PreserVision, if symptoms persist let me know.  She will consider and GI referral   GI referral placed.

## 2020-03-25 NOTE — Telephone Encounter (Signed)
Patient states she was told by Larose Kells to let her know when she is ready for an endoscopy. Please place referral to [facility]. Thanks

## 2020-03-28 DIAGNOSIS — M5126 Other intervertebral disc displacement, lumbar region: Secondary | ICD-10-CM | POA: Diagnosis not present

## 2020-03-28 DIAGNOSIS — M9902 Segmental and somatic dysfunction of thoracic region: Secondary | ICD-10-CM | POA: Diagnosis not present

## 2020-03-28 DIAGNOSIS — M9900 Segmental and somatic dysfunction of head region: Secondary | ICD-10-CM | POA: Diagnosis not present

## 2020-03-28 DIAGNOSIS — M9903 Segmental and somatic dysfunction of lumbar region: Secondary | ICD-10-CM | POA: Diagnosis not present

## 2020-03-28 DIAGNOSIS — M5124 Other intervertebral disc displacement, thoracic region: Secondary | ICD-10-CM | POA: Diagnosis not present

## 2020-03-31 DIAGNOSIS — H43813 Vitreous degeneration, bilateral: Secondary | ICD-10-CM | POA: Diagnosis not present

## 2020-03-31 DIAGNOSIS — H353132 Nonexudative age-related macular degeneration, bilateral, intermediate dry stage: Secondary | ICD-10-CM | POA: Diagnosis not present

## 2020-03-31 DIAGNOSIS — H43822 Vitreomacular adhesion, left eye: Secondary | ICD-10-CM | POA: Diagnosis not present

## 2020-03-31 DIAGNOSIS — H35371 Puckering of macula, right eye: Secondary | ICD-10-CM | POA: Diagnosis not present

## 2020-04-01 ENCOUNTER — Encounter: Payer: Self-pay | Admitting: Gastroenterology

## 2020-05-02 ENCOUNTER — Encounter: Payer: Self-pay | Admitting: Gastroenterology

## 2020-05-02 ENCOUNTER — Ambulatory Visit (INDEPENDENT_AMBULATORY_CARE_PROVIDER_SITE_OTHER): Payer: Medicare Other | Admitting: Gastroenterology

## 2020-05-02 VITALS — BP 130/80 | HR 75 | Ht 60.0 in | Wt 174.4 lb

## 2020-05-02 DIAGNOSIS — R1013 Epigastric pain: Secondary | ICD-10-CM

## 2020-05-02 DIAGNOSIS — K219 Gastro-esophageal reflux disease without esophagitis: Secondary | ICD-10-CM | POA: Diagnosis not present

## 2020-05-02 DIAGNOSIS — R140 Abdominal distension (gaseous): Secondary | ICD-10-CM

## 2020-05-02 DIAGNOSIS — R1012 Left upper quadrant pain: Secondary | ICD-10-CM

## 2020-05-02 NOTE — Patient Instructions (Signed)
If you are age 84 or older, your body mass index should be between 23-30. Your Body mass index is 34.06 kg/m. If this is out of the aforementioned range listed, please consider follow up with your Primary Care Provider.  If you are age 24 or younger, your body mass index should be between 19-25. Your Body mass index is 34.06 kg/m. If this is out of the aformentioned range listed, please consider follow up with your Primary Care Provider.   It has been recommended to you by your physician that you have a(n) endoscopy completed. Per your request, we did not schedule the procedure(s) today. Please contact our office at [phone] should you decide to have the procedure completed.   Due to recent changes in healthcare laws, you may see the results of your imaging and laboratory studies on MyChart before your provider has had a chance to review them.  We understand that in some cases there may be results that are confusing or concerning to you. Not all laboratory results come back in the same time frame and the provider may be waiting for multiple results in order to interpret others.  Please give [place] 48 hours in order for your provider to thoroughly review all the results before contacting the office for clarification of your results.

## 2020-05-02 NOTE — Progress Notes (Signed)
Chief Complaint: Abdominal pain, GERD, regurgitation  Referring Provider:     Colon Branch, MD   HPI:     Marie Hebert is a 86 y.o. female with a history of HTN, DJD, recurrent UTIs, back pain, osteopenia, and palpitations, [facility] for evaluation of abdominal pain, reflux symptoms, and regurgitation.  She states upper abdominal pain started after starting PreserVision for macular degeneration. Has since stopped Rx, but restarting similar vitamins using a different company.   25 years ago had shoulder pain, treated with Ibuprofen with subsequent abdominal pain. Occurs intermittently since then, and more recently with upper abdominal pain, belching, regurgitation. Improvement with Pepto Bismol prn. Pain has been about 2 months with bloating. No dysphagia. No change in stool, hematochezia, melena. Weight stable.    -Abdominal ultrasound (05/2019): Normal   CBC Latest Ref Rng & Units 06/01/2019 05/09/2018 05/29/2017  WBC 4.0 - 10.5 K/uL 5.7 5.4 5.4  Hemoglobin 12.0 - 15.0 g/dL 14.0 14.8 13.7  Hematocrit 36 - 46 % 43.1 44.9 41.9  Platelets 150 - 400 K/uL 206.0 191.0 198.0   CMP Latest Ref Rng & Units 06/01/2019 06/13/2018 05/26/2018  Glucose 70 - 99 mg/dL 88 103(H) 110(H)  BUN 6 - 23 mg/dL 15 16 16   Creatinine 0.40 - 1.20 mg/dL 0.58 0.60 0.64  Sodium 135 - 145 mEq/L 138 140 142  Potassium 3.5 - 5.1 mEq/L 3.8 4.9 5.6(H)  Chloride 96 - 112 mEq/L 104 104 105  CO2 19 - 32 mEq/L 26 29 29   Calcium 8.4 - 10.5 mg/dL 8.9 9.4 9.8  Total Protein 6.0 - 8.3 g/dL 6.3 - -  Total Bilirubin 0.2 - 1.2 mg/dL 0.4 - -  Alkaline Phos 39 - 117 U/L 60 - -  AST 0 - 37 U/L 19 - -  ALT 0 - 35 U/L 12 - -     Past Medical History:  Diagnosis Date  . Back pain   . Dizziness   . Hypertension   . Palpitations      Past Surgical History:  Procedure Laterality Date  . CATARACT EXTRACTION  2010   right  . COLONOSCOPY  2003  . RETINAL LASER PROCEDURE      Family History  Problem Relation Age of Onset  . Cancer Father   . Stomach cancer Father        h pylori  . Diabetes Son   . CAD Neg Hx   . Colon cancer Neg Hx   . Breast cancer Neg Hx    Social History   Tobacco Use  . Smoking status: Never Smoker  . Smokeless tobacco: Never Used  Vaping Use  . Vaping Use: Never used  Substance Use Topics  . Alcohol use: No    Alcohol/week: 0.0 standard drinks  . Drug use: No   Current Outpatient Medications  Medication Sig Dispense Refill  . Cholecalciferol (VITAMIN D) 1000 UNITS capsule Take by mouth 2 (two) times daily.     Marland Kitchen diltiazem (CARDIZEM CD) 120 MG 24 hr capsule Take 1 capsule (120 mg total) by mouth daily. 90 capsule 3  . Magnesium 500 MG CAPS Take 1 capsule by mouth daily.      . Multiple Vitamin (THERA) TABS Take by mouth.    . Multiple Vitamins-Minerals (PRESERVISION AREDS 2 PO) Take by mouth.    . Omega-3 Fatty Acids (FISH OIL) 1000 MG CAPS Take 1 capsule by  mouth daily.      . vitamin C (ASCORBIC ACID) 500 MG tablet Take 500 mg by mouth daily.     No current facility-administered medications for this visit.   Allergies  Allergen Reactions  . Aspirin   . Ibuprofen Other (See Comments)     Review of Systems: All systems reviewed and negative except where noted in HPI.     Physical Exam:    Wt Readings from Last 3 Encounters:  05/02/20 174 lb 6 oz (79.1 kg)  01/22/20 176 lb 2 oz (79.9 kg)  07/07/19 175 lb (79.4 kg)    BP 130/80   Pulse 75   Ht 5' (1.524 m)   Wt 174 lb 6 oz (79.1 kg)   BMI 34.06 kg/m  Constitutional:  Pleasant, in no acute distress. Psychiatric: Normal mood and affect. Behavior is normal. EENT: Pupils normal.  Conjunctivae are normal. No scleral icterus. Neck supple. No cervical LAD. Cardiovascular: Normal rate, regular rhythm. No edema Pulmonary/chest: Effort normal and breath sounds normal. No wheezing, rales or rhonchi. Abdominal: Mild TTP in LUQ without rebound or guarding. No  peritoneal signs. Soft, nondistended. Bowel sounds active throughout. There are no masses palpable. No hepatomegaly. Neurological: Alert and oriented to person place and time. Skin: Skin is warm and dry. No rashes noted.   ASSESSMENT AND PLAN;   1) Epigastric pain 2) LUQ pain 3) Dyspepsia  -Discussed full DDX at length today.  Discussed diagnostic options to include EGD with biopsies, breath testing, empiric trial of medications.  She is strongly opposed to medications unless absolutely necessary, and prefers to move forward with EGD -Schedule EGD to evaluate for PUD, gastritis, H. pylori, etc. with gastric biopsies -As above, patient elected to hold off on trial of medications  4) Reflux symptoms -Evaluate for erosive esophagitis, LES laxity, hiatal hernia, etc. at time of EGD -Discussed antireflux lifestyle/dietary mods  5) Abdominal bloating -Evaluate for mucosal/luminal pathology as above with duodenal biopsies -Discussed SIBO-held off on breath testing or empiric meds  The indications, risks, and benefits of EGD were explained to the patient in detail. Risks include but are not limited to bleeding, perforation, adverse reaction to medications, and cardiopulmonary compromise. Sequelae include but are not limited to the possibility of surgery, hositalization, and mortality. The patient verbalized understanding and wished to proceed. All questions answered, referred to scheduler. Further recommendations pending results of the exam.     Lavena Bullion, DO, FACG  05/02/2020, 11:25 AM   Colon Branch, MD

## 2020-05-05 ENCOUNTER — Telehealth: Payer: Self-pay | Admitting: Gastroenterology

## 2020-05-05 DIAGNOSIS — R1012 Left upper quadrant pain: Secondary | ICD-10-CM

## 2020-05-05 DIAGNOSIS — R1013 Epigastric pain: Secondary | ICD-10-CM

## 2020-05-05 DIAGNOSIS — K219 Gastro-esophageal reflux disease without esophagitis: Secondary | ICD-10-CM

## 2020-05-05 NOTE — Telephone Encounter (Signed)
Scheduled the patient for endoscopy 06/06/2020 ta Mailed the patient a copy of her instructions.

## 2020-06-06 ENCOUNTER — Encounter: Payer: Medicare Other | Admitting: Gastroenterology

## 2020-06-28 ENCOUNTER — Telehealth: Payer: Self-pay | Admitting: Gastroenterology

## 2020-06-28 NOTE — Telephone Encounter (Signed)
Okay, thank you for the update and for rescheduling the procedure.

## 2020-06-28 NOTE — Telephone Encounter (Signed)
Good afternoon Dr. Barron Alvine, this patient called stating she has recently been in contact with someone who tested positive for covid.  Rescheduled her procedure from 06/30/20 to 07/22/20.

## 2020-06-29 ENCOUNTER — Encounter: Payer: Medicare Other | Admitting: Gastroenterology

## 2020-06-30 ENCOUNTER — Encounter: Payer: Medicare Other | Admitting: Gastroenterology

## 2020-07-07 ENCOUNTER — Encounter: Payer: Self-pay | Admitting: Internal Medicine

## 2020-07-07 ENCOUNTER — Ambulatory Visit (INDEPENDENT_AMBULATORY_CARE_PROVIDER_SITE_OTHER): Payer: Medicare Other | Admitting: Internal Medicine

## 2020-07-07 ENCOUNTER — Other Ambulatory Visit: Payer: Self-pay

## 2020-07-07 VITALS — BP 136/70 | HR 64 | Temp 97.5°F | Ht 60.0 in | Wt 172.0 lb

## 2020-07-07 DIAGNOSIS — Z0000 Encounter for general adult medical examination without abnormal findings: Secondary | ICD-10-CM

## 2020-07-07 DIAGNOSIS — I10 Essential (primary) hypertension: Secondary | ICD-10-CM

## 2020-07-07 LAB — COMPREHENSIVE METABOLIC PANEL
ALT: 12 U/L (ref 0–35)
AST: 18 U/L (ref 0–37)
Albumin: 4.3 g/dL (ref 3.5–5.2)
Alkaline Phosphatase: 59 U/L (ref 39–117)
BUN: 15 mg/dL (ref 6–23)
CO2: 29 mEq/L (ref 19–32)
Calcium: 9.3 mg/dL (ref 8.4–10.5)
Chloride: 105 mEq/L (ref 96–112)
Creatinine, Ser: 0.59 mg/dL (ref 0.40–1.20)
GFR: 82.12 mL/min (ref >60.00)
Glucose, Bld: 103 mg/dL — ABNORMAL HIGH (ref 70–99)
Potassium: 4.7 mEq/L (ref 3.5–5.1)
Sodium: 139 mEq/L (ref 135–145)
Total Bilirubin: 0.6 mg/dL (ref 0.2–1.2)
Total Protein: 6.7 g/dL (ref 6.0–8.3)

## 2020-07-07 LAB — CBC WITH DIFFERENTIAL/PLATELET
Basophils Absolute: 0.1 10*3/uL (ref 0.0–0.1)
Basophils Relative: 1.3 % (ref 0.0–3.0)
Eosinophils Absolute: 0.2 10*3/uL (ref 0.0–0.7)
Eosinophils Relative: 4.1 % (ref 0.0–5.0)
HCT: 42.7 % (ref 36.0–46.0)
Hemoglobin: 13.9 g/dL (ref 12.0–15.0)
Lymphocytes Relative: 38.9 % (ref 12.0–46.0)
Lymphs Abs: 2.2 10*3/uL (ref 0.7–4.0)
MCHC: 32.6 g/dL (ref 30.0–36.0)
MCV: 90.1 fl (ref 78.0–100.0)
Monocytes Absolute: 0.5 10*3/uL (ref 0.1–1.0)
Monocytes Relative: 8.6 % (ref 3.0–12.0)
Neutro Abs: 2.7 10*3/uL (ref 1.4–7.7)
Neutrophils Relative %: 47.1 % (ref 43.0–77.0)
Platelets: 209 10*3/uL (ref 150.0–400.0)
RBC: 4.75 Mil/uL (ref 3.87–5.11)
RDW: 15.3 % (ref 11.5–15.5)
WBC: 5.7 10*3/uL (ref 4.0–10.5)

## 2020-07-07 LAB — LIPID PANEL
Cholesterol: 284 mg/dL — ABNORMAL HIGH (ref 0–200)
HDL: 92.5 mg/dL (ref >39.00)
LDL Cholesterol: 167 mg/dL — ABNORMAL HIGH (ref 0–99)
NonHDL: 191.03
Total CHOL/HDL Ratio: 3
Triglycerides: 119 mg/dL (ref 0.0–149.0)
VLDL: 23.8 mg/dL (ref 0.0–40.0)

## 2020-07-07 LAB — TSH: TSH: 1.01 u[IU]/mL (ref 0.35–4.50)

## 2020-07-07 NOTE — Progress Notes (Signed)
Subjective:    Patient ID: Marie Hebert, female    DOB: [birth year], 87 y.o.   MRN: [id number]  DOS:  07/07/2020 Type of visit - description: CPX No new concerns  Was seen w/ dyspepsia, saw GI , note reviewed. Overall sxs slt decreased. Denies blood in the stools.   Review of Systems  Other than above, a 14 point review of systems is negative    Past Medical History:  Diagnosis Date  . Back pain   . Dizziness   . Hypertension   . Palpitations     Past Surgical History:  Procedure Laterality Date  . CATARACT EXTRACTION  2010   right  . COLONOSCOPY  2003  . RETINAL LASER PROCEDURE      Allergies as of 07/07/2020      Reactions   Aspirin    Ibuprofen Other (See Comments)      Medication List       Accurate as of July 07, 2020  9:34 PM. If you have any questions, ask your nurse or doctor.        diltiazem 120 MG 24 hr capsule Commonly known as: CARDIZEM CD Take 1 capsule (120 mg total) by mouth daily.   Fish Oil 1000 MG Caps Take 1 capsule by mouth daily.   Magnesium 500 MG Caps Take 1 capsule by mouth daily.   PRESERVISION AREDS 2 PO Take by mouth.   Thera Tabs Take by mouth.   vitamin C 500 MG tablet Commonly known as: ASCORBIC ACID Take 500 mg by mouth daily.   Vitamin D 1000 units capsule Take by mouth 2 (two) times daily.          Objective:   Physical Exam BP 136/70 (BP Location: Right Arm, Patient Position: Sitting, Cuff Size: Large)   Pulse 64   Temp (!) 97.5 F (36.4 C) (Oral)   Ht 5' (1.524 m)   Wt 172 lb (78 kg)   SpO2 (!) 64%   BMI 33.59 kg/m  General: Well developed, NAD, BMI noted Neck: No  thyromegaly  HEENT:  Normocephalic . Face symmetric, atraumatic Lungs:  CTA B Normal respiratory effort, no intercostal retractions, no accessory muscle use. Heart: RRR,  no murmur.  Abdomen:  Not distended, soft, non-tender. No rebound or rigidity.   Lower extremities: no pretibial edema bilaterally  Skin: Exposed  areas without rash. Not pale. Not jaundice Neurologic:  alert & oriented X3.  Speech normal, gait appropriate for age and unassisted Strength symmetric and appropriate for age.  Psych: Cognition and judgment appear intact.  Cooperative with normal attention span and concentration.  Behavior appropriate. No anxious or depressed appearing.     Assessment      Assessment  HTN (h/o palpitations, on cardiazem) DJD Recurrent UTIs; extensive urology w/u  2015:atrophic vaginitis, eventually decided to try hormonal creams Back pain, chronic, needs a disability parking  Palpitations: Saw cardiology 05-2017: Holter, echo, stress echo essentially negative Osteopenia: T score -1.5 (02/2016)  PLAN: Here for CPX HTN: On diltiazem, BP today is very good, labs. Dyspepsia: See last visit,Since then saw GI, multiple options for work-up or treatment recommended, they agreed to do EGD, however it was canceled by the patient.  Symptoms are overall decreased, she wonders about doing H. pylori testing, recommend to discuss further management with GI. Social: Lives herself, drives, completely independent, takes walks most days, no recent falls. RTC 1 year    Dyspepsia: Developed ill-defined upper abdominal discomfort after she started PreserVision.  She has complained of dyspepsia, see to previous visit, blood work and ultrasound of the abdomen were normal. Plan: Stop PreserVision, if symptoms persist let me know.  She will consider and GI referral Palpitations: As described above.  No red flag symptoms, history of palpitations before. EKG today: No acute changes, sinus rhythm, low voltage precordial leads, no major changes from previous EKGs. Reluctant to take Covid vaccination patient educated about pro>>cons of the vaccine patient will not proceed    This visit occurred during the SARS-CoV-2 public health emergency.  Safety protocols were in place, including screening questions prior to the visit,  additional usage of staff PPE, and extensive cleaning of exam room while observing appropriate contact time as indicated for disinfecting solutions.

## 2020-07-07 NOTE — Progress Notes (Signed)
Pre visit review using our clinic review tool, if applicable. No additional management support is needed unless otherwise documented below in the visit note. 

## 2020-07-07 NOTE — Assessment & Plan Note (Signed)
Here for CPX HTN: On diltiazem, BP today is very good, labs. Dyspepsia: See last visit,Since then saw GI, multiple options for work-up or treatment recommended, they agreed to do EGD, however it was canceled by the patient.  Symptoms are overall decreased, she wonders about doing H. pylori testing, recommend to discuss further management with GI. Social: Lives herself, drives, completely independent, takes walks most days, no recent falls. RTC 1 year

## 2020-07-07 NOTE — Patient Instructions (Addendum)
Check the  blood pressure   BP GOAL is between 110/65 and  135/85. If it is consistently higher or lower, let me know    GO TO THE LAB : Get the blood work     GO TO THE FRONT DESK, PLEASE SCHEDULE YOUR APPOINTMENTS Come back for a physical exam in 1 year 

## 2020-07-07 NOTE — Assessment & Plan Note (Signed)
Td 2009;consistently declines any shot or screening procedures. Labs: CMP, FLP, CBC, TSH Lifestyle remains very good.

## 2020-07-20 ENCOUNTER — Telehealth: Payer: Self-pay | Admitting: Internal Medicine

## 2020-07-20 NOTE — Telephone Encounter (Signed)
Patient states she would like her recent  lab results sent to her address.  Please advise

## 2020-07-20 NOTE — Telephone Encounter (Signed)
Called pt to advise on results, pt stated that she does not want to start medication at this moment and will continue to try to have a healthy diet. Copy of the results resent today as well.-JMA

## 2020-07-22 ENCOUNTER — Encounter: Payer: Medicare Other | Admitting: Gastroenterology

## 2020-07-23 ENCOUNTER — Other Ambulatory Visit: Payer: Self-pay | Admitting: Internal Medicine

## 2020-07-27 DIAGNOSIS — M25562 Pain in left knee: Secondary | ICD-10-CM | POA: Diagnosis not present

## 2020-07-27 DIAGNOSIS — M170 Bilateral primary osteoarthritis of knee: Secondary | ICD-10-CM | POA: Diagnosis not present

## 2020-07-27 DIAGNOSIS — M25561 Pain in right knee: Secondary | ICD-10-CM | POA: Diagnosis not present

## 2020-09-25 ENCOUNTER — Emergency Department (HOSPITAL_BASED_OUTPATIENT_CLINIC_OR_DEPARTMENT_OTHER): Payer: Medicare Other

## 2020-09-25 ENCOUNTER — Encounter (HOSPITAL_BASED_OUTPATIENT_CLINIC_OR_DEPARTMENT_OTHER): Payer: Self-pay | Admitting: *Deleted

## 2020-09-25 ENCOUNTER — Encounter (HOSPITAL_COMMUNITY)
Admission: EM | Disposition: A | Discharge status: Home / Self Care | Payer: Self-pay | Source: Home / Self Care | Attending: Emergency Medicine

## 2020-09-25 ENCOUNTER — Observation Stay (HOSPITAL_BASED_OUTPATIENT_CLINIC_OR_DEPARTMENT_OTHER)
Admission: EM | Admit: 2020-09-25 | Discharge: 2020-09-26 | Disposition: A | Discharge status: Home / Self Care | Payer: Medicare Other | Attending: Physician Assistant | Admitting: Physician Assistant

## 2020-09-25 ENCOUNTER — Other Ambulatory Visit: Payer: Self-pay

## 2020-09-25 ENCOUNTER — Emergency Department (HOSPITAL_COMMUNITY): Payer: Medicare Other

## 2020-09-25 ENCOUNTER — Emergency Department (HOSPITAL_COMMUNITY): Payer: Medicare Other | Admitting: Anesthesiology

## 2020-09-25 DIAGNOSIS — I10 Essential (primary) hypertension: Secondary | ICD-10-CM | POA: Diagnosis not present

## 2020-09-25 DIAGNOSIS — K5730 Diverticulosis of large intestine without perforation or abscess without bleeding: Secondary | ICD-10-CM | POA: Diagnosis not present

## 2020-09-25 DIAGNOSIS — Z9889 Other specified postprocedural states: Secondary | ICD-10-CM

## 2020-09-25 DIAGNOSIS — R1031 Right lower quadrant pain: Secondary | ICD-10-CM

## 2020-09-25 DIAGNOSIS — R109 Unspecified abdominal pain: Secondary | ICD-10-CM | POA: Diagnosis not present

## 2020-09-25 DIAGNOSIS — K3580 Unspecified acute appendicitis: Secondary | ICD-10-CM | POA: Diagnosis present

## 2020-09-25 DIAGNOSIS — K353 Acute appendicitis with localized peritonitis, without perforation or gangrene: Principal | ICD-10-CM | POA: Insufficient documentation

## 2020-09-25 DIAGNOSIS — Z79899 Other long term (current) drug therapy: Secondary | ICD-10-CM | POA: Diagnosis not present

## 2020-09-25 DIAGNOSIS — Z20822 Contact with and (suspected) exposure to covid-19: Secondary | ICD-10-CM | POA: Diagnosis not present

## 2020-09-25 DIAGNOSIS — I340 Nonrheumatic mitral (valve) insufficiency: Secondary | ICD-10-CM | POA: Diagnosis not present

## 2020-09-25 DIAGNOSIS — E559 Vitamin D deficiency, unspecified: Secondary | ICD-10-CM | POA: Diagnosis not present

## 2020-09-25 HISTORY — PX: LAPAROSCOPIC APPENDECTOMY: SHX408

## 2020-09-25 LAB — COMPREHENSIVE METABOLIC PANEL
ALT: 15 U/L (ref 0–44)
AST: 25 U/L (ref 15–41)
Albumin: 3.7 g/dL (ref 3.5–5.0)
Alkaline Phosphatase: 52 U/L (ref 38–126)
Anion gap: 12 (ref 5–15)
BUN: 14 mg/dL (ref 8–23)
CO2: 23 mmol/L (ref 22–32)
Calcium: 9.1 mg/dL (ref 8.9–10.3)
Chloride: 99 mmol/L (ref 98–111)
Creatinine, Ser: 0.6 mg/dL (ref 0.44–1.00)
GFR, Estimated: >60 mL/min (ref >60)
Glucose, Bld: 147 mg/dL — ABNORMAL HIGH (ref 70–99)
Potassium: 3.5 mmol/L (ref 3.5–5.1)
Sodium: 134 mmol/L — ABNORMAL LOW (ref 135–145)
Total Bilirubin: 0.8 mg/dL (ref 0.3–1.2)
Total Protein: 6.7 g/dL (ref 6.5–8.1)

## 2020-09-25 LAB — CBC WITH DIFFERENTIAL/PLATELET
Abs Immature Granulocytes: 0.11 10*3/uL — ABNORMAL HIGH (ref 0.00–0.07)
Basophils Absolute: 0.1 10*3/uL (ref 0.0–0.1)
Basophils Relative: 0 %
Eosinophils Absolute: 0 10*3/uL (ref 0.0–0.5)
Eosinophils Relative: 0 %
HCT: 41.7 % (ref 36.0–46.0)
Hemoglobin: 14.2 g/dL (ref 12.0–15.0)
Immature Granulocytes: 1 %
Lymphocytes Relative: 2 %
Lymphs Abs: 0.4 10*3/uL — ABNORMAL LOW (ref 0.7–4.0)
MCH: 30 pg (ref 26.0–34.0)
MCHC: 34.1 g/dL (ref 30.0–36.0)
MCV: 88.2 fL (ref 80.0–100.0)
Monocytes Absolute: 0.5 10*3/uL (ref 0.1–1.0)
Monocytes Relative: 3 %
Neutro Abs: 18.1 10*3/uL — ABNORMAL HIGH (ref 1.7–7.7)
Neutrophils Relative %: 94 %
Platelets: 201 10*3/uL (ref 150–400)
RBC: 4.73 MIL/uL (ref 3.87–5.11)
RDW: 14.1 % (ref 11.5–15.5)
WBC: 19 10*3/uL — ABNORMAL HIGH (ref 4.0–10.5)
nRBC: 0 % (ref 0.0–0.2)

## 2020-09-25 LAB — SARS CORONAVIRUS 2 BY RT PCR (HOSPITAL ORDER, PERFORMED IN ~~LOC~~ HOSPITAL LAB): SARS Coronavirus 2: NEGATIVE

## 2020-09-25 LAB — LIPASE, BLOOD: Lipase: 29 U/L (ref 11–51)

## 2020-09-25 LAB — POC SARS CORONAVIRUS 2 AG -  ED: SARS Coronavirus 2 Ag: NEGATIVE

## 2020-09-25 SURGERY — APPENDECTOMY, LAPAROSCOPIC
Anesthesia: General

## 2020-09-25 MED ORDER — METOPROLOL TARTRATE 5 MG/5ML IV SOLN: 5.0000 mg | Freq: Four times a day (QID) | INTRAVENOUS | Status: DC | PRN

## 2020-09-25 MED ORDER — MORPHINE SULFATE (PF) 2 MG/ML IV SOLN
2.0000 mg | Freq: Once | INTRAVENOUS | Status: AC
  Administered 2020-09-25: 2 mg via INTRAVENOUS
  Filled 2020-09-25: qty 1

## 2020-09-25 MED ORDER — SUCCINYLCHOLINE CHLORIDE 20 MG/ML IJ SOLN
INTRAMUSCULAR | Status: DC | PRN
  Administered 2020-09-25: 100 mg via INTRAVENOUS

## 2020-09-25 MED ORDER — ROCURONIUM BROMIDE 10 MG/ML (PF) SYRINGE
PREFILLED_SYRINGE | INTRAVENOUS | Status: DC | PRN
  Administered 2020-09-25: 30 mg via INTRAVENOUS

## 2020-09-25 MED ORDER — BUPIVACAINE-EPINEPHRINE 0.25% -1:200000 IJ SOLN
INTRAMUSCULAR | Status: DC | PRN
  Administered 2020-09-25 (×3): 10 mL

## 2020-09-25 MED ORDER — FENTANYL CITRATE (PF) 100 MCG/2ML IJ SOLN: 25.0000 ug | INTRAMUSCULAR | Status: DC | PRN

## 2020-09-25 MED ORDER — OXYCODONE HCL 5 MG PO TABS: 5.0000 mg | ORAL_TABLET | Freq: Four times a day (QID) | ORAL | Status: DC | PRN

## 2020-09-25 MED ORDER — LIDOCAINE 2% (20 MG/ML) 5 ML SYRINGE
INTRAMUSCULAR | Status: DC | PRN
  Administered 2020-09-25: 60 mg via INTRAVENOUS

## 2020-09-25 MED ORDER — FENTANYL CITRATE (PF) 100 MCG/2ML IJ SOLN
INTRAMUSCULAR | Status: DC | PRN
  Administered 2020-09-25: 25 ug via INTRAVENOUS
  Administered 2020-09-25: 75 ug via INTRAVENOUS

## 2020-09-25 MED ORDER — ONDANSETRON HCL 4 MG/2ML IJ SOLN
INTRAMUSCULAR | Status: DC | PRN
  Administered 2020-09-25: 4 mg via INTRAVENOUS

## 2020-09-25 MED ORDER — ONDANSETRON HCL 4 MG/2ML IJ SOLN: 4.0000 mg | Freq: Four times a day (QID) | INTRAMUSCULAR | Status: DC | PRN

## 2020-09-25 MED ORDER — ONDANSETRON HCL 4 MG/2ML IJ SOLN
4.0000 mg | Freq: Once | INTRAMUSCULAR | Status: AC
  Administered 2020-09-25: 4 mg via INTRAVENOUS
  Filled 2020-09-25 (×2): qty 2

## 2020-09-25 MED ORDER — ACETAMINOPHEN 650 MG RE SUPP
650.0000 mg | Freq: Four times a day (QID) | RECTAL | Status: DC | PRN
Start: 2020-09-25 — End: 2020-09-26

## 2020-09-25 MED ORDER — OXYCODONE HCL 5 MG/5ML PO SOLN: 5.0000 mg | Freq: Once | ORAL | Status: DC | PRN

## 2020-09-25 MED ORDER — PROPOFOL 10 MG/ML IV BOLUS
INTRAVENOUS | Status: DC | PRN
  Administered 2020-09-25: 120 mg via INTRAVENOUS

## 2020-09-25 MED ORDER — FENTANYL CITRATE (PF) 250 MCG/5ML IJ SOLN
INTRAMUSCULAR | Status: AC
  Filled 2020-09-25: qty 5

## 2020-09-25 MED ORDER — IOHEXOL 300 MG/ML  SOLN
100.0000 mL | Freq: Once | INTRAMUSCULAR | Status: AC | PRN
  Administered 2020-09-25: 100 mL via INTRAVENOUS

## 2020-09-25 MED ORDER — 0.9 % SODIUM CHLORIDE (POUR BTL) OPTIME
TOPICAL | Status: DC | PRN
  Administered 2020-09-25: 1000 mL

## 2020-09-25 MED ORDER — OXYCODONE HCL 5 MG PO TABS: 5.0000 mg | ORAL_TABLET | Freq: Once | ORAL | Status: DC | PRN

## 2020-09-25 MED ORDER — POLYETHYLENE GLYCOL 3350 17 G PO PACK: 17.0000 g | PACK | Freq: Every day | ORAL | Status: DC | PRN

## 2020-09-25 MED ORDER — PHENYLEPHRINE HCL-NACL 10-0.9 MG/250ML-% IV SOLN
INTRAVENOUS | Status: DC | PRN
  Administered 2020-09-25: 50 ug/min via INTRAVENOUS

## 2020-09-25 MED ORDER — BUPIVACAINE HCL (PF) 0.25 % IJ SOLN
INTRAMUSCULAR | Status: AC
  Filled 2020-09-25: qty 30

## 2020-09-25 MED ORDER — DEXAMETHASONE SODIUM PHOSPHATE 10 MG/ML IJ SOLN
INTRAMUSCULAR | Status: DC | PRN
  Administered 2020-09-25: 10 mg via INTRAVENOUS

## 2020-09-25 MED ORDER — ENOXAPARIN SODIUM 40 MG/0.4ML ~~LOC~~ SOLN
40.0000 mg | SUBCUTANEOUS | Status: DC
  Filled 2020-09-25: qty 0.4

## 2020-09-25 MED ORDER — PIPERACILLIN-TAZOBACTAM 3.375 G IVPB 30 MIN
3.3750 g | Freq: Once | INTRAVENOUS | Status: AC
  Administered 2020-09-25: 3.375 g via INTRAVENOUS
  Filled 2020-09-25: qty 50

## 2020-09-25 MED ORDER — SODIUM CHLORIDE 0.9 % IR SOLN
Status: DC | PRN
  Administered 2020-09-25: 1000 mL

## 2020-09-25 MED ORDER — KCL IN DEXTROSE-NACL 20-5-0.45 MEQ/L-%-% IV SOLN
INTRAVENOUS | Status: DC
  Filled 2020-09-25: qty 1000

## 2020-09-25 MED ORDER — SUGAMMADEX SODIUM 200 MG/2ML IV SOLN
INTRAVENOUS | Status: DC | PRN
  Administered 2020-09-25: 200 mg via INTRAVENOUS

## 2020-09-25 MED ORDER — MORPHINE SULFATE (PF) 2 MG/ML IV SOLN: 2.0000 mg | INTRAVENOUS | Status: DC | PRN

## 2020-09-25 MED ORDER — ACETAMINOPHEN 325 MG PO TABS: 650.0000 mg | ORAL_TABLET | Freq: Four times a day (QID) | ORAL | Status: DC | PRN

## 2020-09-25 MED ORDER — LACTATED RINGERS IV SOLN: INTRAVENOUS | Status: DC | PRN

## 2020-09-25 MED ORDER — ONDANSETRON 4 MG PO TBDP: 4.0000 mg | ORAL_TABLET | Freq: Four times a day (QID) | ORAL | Status: DC | PRN

## 2020-09-25 MED ORDER — PROPOFOL 10 MG/ML IV BOLUS
INTRAVENOUS | Status: AC
  Filled 2020-09-25: qty 20

## 2020-09-25 SURGICAL SUPPLY — 46 items
ADH SKN CLS APL DERMABOND .7 (GAUZE/BANDAGES/DRESSINGS) ×1
APL PRP STRL LF DISP 70% ISPRP (MISCELLANEOUS) ×1
APPLIER CLIP 5 13 M/L LIGAMAX5 (MISCELLANEOUS) ×2
APR CLP MED LRG 5 ANG JAW (MISCELLANEOUS) ×1
BAG SPEC RTRVL 10 TROC 200 (ENDOMECHANICALS) ×1
CANISTER SUCT 3000ML PPV (MISCELLANEOUS) ×2 IMPLANT
CHLORAPREP W/TINT 26 (MISCELLANEOUS) ×2 IMPLANT
CLIP APPLIE 5 13 M/L LIGAMAX5 (MISCELLANEOUS) IMPLANT
CLIP VESOLOCK MED LG 6/CT (CLIP) ×1 IMPLANT
CLIP VESOLOCK XL 6/CT (CLIP) ×2 IMPLANT
COVER SURGICAL LIGHT HANDLE (MISCELLANEOUS) ×2 IMPLANT
COVER WAND RF STERILE (DRAPES) ×1 IMPLANT
CUTTER FLEX LINEAR 45M (STAPLE) IMPLANT
DERMABOND ADVANCED (GAUZE/BANDAGES/DRESSINGS) ×1
DERMABOND ADVANCED .7 DNX12 (GAUZE/BANDAGES/DRESSINGS) ×1 IMPLANT
ELECT REM PT RETURN 9FT ADLT (ELECTROSURGICAL) ×2
ELECTRODE REM PT RTRN 9FT ADLT (ELECTROSURGICAL) ×1 IMPLANT
ENDOLOOP SUT PDS II  0 18 (SUTURE)
ENDOLOOP SUT PDS II 0 18 (SUTURE) IMPLANT
GLOVE SURG SS PI 7.0 STRL IVOR (GLOVE) ×3 IMPLANT
GLOVE SURG UNDER POLY LF SZ7 (GLOVE) ×2 IMPLANT
GOWN STRL REUS W/ TWL LRG LVL3 (GOWN DISPOSABLE) ×3 IMPLANT
GOWN STRL REUS W/TWL LRG LVL3 (GOWN DISPOSABLE) ×6
GRASPER SUT TROCAR 14GX15 (MISCELLANEOUS) ×2 IMPLANT
KIT BASIN OR (CUSTOM PROCEDURE TRAY) ×2 IMPLANT
KIT TURNOVER KIT B (KITS) ×2 IMPLANT
NEEDLE 22X1 1/2 (OR ONLY) (NEEDLE) ×2 IMPLANT
NS IRRIG 1000ML POUR BTL (IV SOLUTION) ×2 IMPLANT
PAD ARMBOARD 7.5X6 YLW CONV (MISCELLANEOUS) ×4 IMPLANT
POUCH RETRIEVAL ECOSAC 10 (ENDOMECHANICALS) ×1 IMPLANT
POUCH RETRIEVAL ECOSAC 10MM (ENDOMECHANICALS) ×2
RELOAD STAPLE 45 3.5 BLU ETS (ENDOMECHANICALS) IMPLANT
RELOAD STAPLE TA45 3.5 REG BLU (ENDOMECHANICALS) ×2 IMPLANT
SCISSORS LAP 5X35 DISP (ENDOMECHANICALS) ×2 IMPLANT
SET IRRIG TUBING LAPAROSCOPIC (IRRIGATION / IRRIGATOR) ×2 IMPLANT
SET TUBE SMOKE EVAC HIGH FLOW (TUBING) ×2 IMPLANT
SLEEVE ENDOPATH XCEL 5M (ENDOMECHANICALS) ×2 IMPLANT
SPECIMEN JAR SMALL (MISCELLANEOUS) ×2 IMPLANT
SUT MNCRL AB 4-0 PS2 18 (SUTURE) ×2 IMPLANT
TOWEL GREEN STERILE (TOWEL DISPOSABLE) ×2 IMPLANT
TOWEL GREEN STERILE FF (TOWEL DISPOSABLE) ×2 IMPLANT
TRAY FOLEY W/BAG SLVR 14FR (SET/KITS/TRAYS/PACK) IMPLANT
TRAY LAPAROSCOPIC MC (CUSTOM PROCEDURE TRAY) ×2 IMPLANT
TROCAR XCEL 12X100 BLDLESS (ENDOMECHANICALS) ×2 IMPLANT
TROCAR XCEL NON-BLD 5MMX100MML (ENDOMECHANICALS) ×2 IMPLANT
WATER STERILE IRR 1000ML POUR (IV SOLUTION) ×2 IMPLANT

## 2020-09-25 NOTE — ED Provider Notes (Signed)
Pt transferred from [facility] with concern for possible appendicitis. Began having RLQ abdominal pain with nausea yesterday. Unable to obtain CT with contrast at other facility due to contrast machine being down. Noncon CT did not visualize the appendix. Sent to this facility for CT with contrast to rule out appendicitis.   Physical Exam  BP 135/62   Pulse 75   Temp 98.1 F (36.7 C) (Oral)   Resp 18   Ht 5' (1.524 m)   Wt 77.1 kg   SpO2 98%   BMI 33.20 kg/m   Physical Exam Vitals and nursing note reviewed.  Constitutional:      Appearance: She is not ill-appearing.  HENT:     Head: Normocephalic and atraumatic.  Eyes:     Conjunctiva/sclera: Conjunctivae normal.  Cardiovascular:     Rate and Rhythm: Normal rate and regular rhythm.     Heart sounds: Normal heart sounds.  Pulmonary:     Effort: Pulmonary effort is normal.     Breath sounds: Normal breath sounds.  Abdominal:     Tenderness: There is abdominal tenderness in the right lower quadrant.  Skin:    General: Skin is warm and dry.     Coloration: Skin is not jaundiced.  Neurological:     Mental Status: She is alert.     ED Course/Procedures     Procedures  Results for orders placed or performed during the hospital encounter of 09/25/20  Comprehensive metabolic panel  Result Value Ref Range   Sodium 134 (L) 135 - 145 mmol/L   Potassium 3.5 3.5 - 5.1 mmol/L   Chloride 99 98 - 111 mmol/L   CO2 23 22 - 32 mmol/L   Glucose, Bld 147 (H) 70 - 99 mg/dL   BUN 14 8 - 23 mg/dL   Creatinine, Ser 0.60 0.44 - 1.00 mg/dL   Calcium 9.1 8.9 - 10.3 mg/dL   Total Protein 6.7 6.5 - 8.1 g/dL   Albumin 3.7 3.5 - 5.0 g/dL   AST 25 15 - 41 U/L   ALT 15 0 - 44 U/L   Alkaline Phosphatase 52 38 - 126 U/L   Total Bilirubin 0.8 0.3 - 1.2 mg/dL   GFR, Estimated >60 >60 mL/min   Anion gap 12 5 - 15  Lipase, blood  Result Value Ref Range   Lipase 29 11 - 51 U/L  CBC with Differential  Result Value Ref Range   WBC  19.0 (H) 4.0 - 10.5 K/uL   RBC 4.73 3.87 - 5.11 MIL/uL   Hemoglobin 14.2 12.0 - 15.0 g/dL   HCT 41.7 36.0 - 46.0 %   MCV 88.2 80.0 - 100.0 fL   MCH 30.0 26.0 - 34.0 pg   MCHC 34.1 30.0 - 36.0 g/dL   RDW 14.1 11.5 - 15.5 %   Platelets 201 150 - 400 K/uL   nRBC 0.0 0.0 - 0.2 %   Neutrophils Relative % 94 %   Neutro Abs 18.1 (H) 1.7 - 7.7 K/uL   Lymphocytes Relative 2 %   Lymphs Abs 0.4 (L) 0.7 - 4.0 K/uL   Monocytes Relative 3 %   Monocytes Absolute 0.5 0.1 - 1.0 K/uL   Eosinophils Relative 0 %   Eosinophils Absolute 0.0 0.0 - 0.5 K/uL   Basophils Relative 0 %   Basophils Absolute 0.1 0.0 - 0.1 K/uL   Immature Granulocytes 1 %   Abs Immature Granulocytes 0.11 (H) 0.00 - 0.07 K/uL   CT ABDOMEN PELVIS  WO CONTRAST  Result Date: 09/25/2020 CLINICAL DATA:  Nausea and vomiting with abdominal pain since yesterday. Possible food stuck in esophagus yesterday with vomiting. EXAM: CT ABDOMEN AND PELVIS WITHOUT CONTRAST TECHNIQUE: Multidetector CT imaging of the abdomen and pelvis was performed following the standard protocol without IV contrast. COMPARISON:  CT urogram 05/05/2013 FINDINGS: Lower chest: Lung bases are clear. Hepatobiliary: Liver, gallbladder and biliary tree are normal. Pancreas: Normal. Spleen: Normal. Adrenals/Urinary Tract: Adrenal glands are normal. Kidneys are normal in size without hydronephrosis or nephrolithiasis. There are multiple parapelvic left renal cysts slightly more prominent compared to the previous exam. 1.7 cm cyst over the upper pole right kidney and subcentimeter cyst over the right mid pole. Ureters and bladder are normal. Stomach/Bowel: Stomach and small bowel are normal. There is mild diverticulosis of the colon. Appendix not visualized. Vascular/Lymphatic: Mild calcified plaque over the abdominal aorta which is normal in caliber. No adenopathy. Reproductive: Normal. Other: No free fluid or focal inflammatory change. Musculoskeletal: Degenerative change of the spine  with moderate disc disease at the L4-5 level. IMPRESSION: 1. No acute findings in the abdomen/pelvis. 2. Mild colonic diverticulosis. 3. Aortic atherosclerosis. 4. Bilateral renal cysts. Aortic Atherosclerosis (ICD10-I70.0). Electronically Signed   By: Marin Olp M.D.   On: 09/25/2020 10:13   CT ABDOMEN PELVIS W CONTRAST  Result Date: 09/25/2020 CLINICAL DATA:  RIGHT lower quadrant pain. Suspicion for appendicitis. 87 year old female. Nausea and vomiting EXAM: CT ABDOMEN AND PELVIS WITH CONTRAST TECHNIQUE: Multidetector CT imaging of the abdomen and pelvis was performed using the standard protocol following bolus administration of intravenous contrast. CONTRAST:  167mL OMNIPAQUE IOHEXOL 300 MG/ML  SOLN COMPARISON:  None. FINDINGS: Lower chest: Lung bases are clear. Hepatobiliary: No focal hepatic lesion. No biliary duct dilatation. Common bile duct is normal. Pancreas: Pancreas is normal. No ductal dilatation. No pancreatic inflammation. Spleen: Normal spleen Adrenals/urinary tract: Adrenal glands and kidneys are normal. The ureters and bladder normal. Stomach/Bowel: Small hiatal hernia. Stomach, duodenum small-bowel normal. Terminal ileum normal. The appendix extends inferior from the cecum into the iliac fossa on the RIGHT. Appendix is relatively short measuring only 4-5 cm. The appendix is fluid-filled and dilated to 11 mm (image 60/series 3). There is mild mucosal enhancement. Minimal periappendiceal fluid or inflammation. The ascending colon and transverse colon normal. There are several diverticula descending colon without acute inflammation. Rectum normal Vascular/Lymphatic: Abdominal aorta is normal caliber. No periportal or retroperitoneal adenopathy. No pelvic adenopathy. Reproductive: Uterus and adnexa unremarkable. Other: No free fluid. Musculoskeletal: No aggressive osseous lesion. IMPRESSION: 1. Fluid-filled mildly dilated appendix with mucosal enhancement. Findings concerning for early acute  appendicitis. Minimal periappendiceal inflammation. No abscess or rupture present. 2. Mild LEFT colon diverticulosis without evidence diverticulitis. Electronically Signed   By: Suzy Bouchard M.D.   On: 09/25/2020 15:05    MDM  CT scan with findings of fluid-filled mildly dilated appendix with mucosal enhancement concerning for early acute appendicitis.  Have discussed case with general surgeon Dr. Kieth Brightly who will come evaluate patient, agrees with Zosyn at this time.  Covid test has been ordered.  She has been updated on plan.  She has not eaten anything since yesterday around 12 PM.  She has been drinking small sips of water here and there.  She is not anticoagulated.  Does not appear she is ever had surgery in the past.  This note was prepared using Dragon voice recognition software and may include unintentional dictation errors due to the inherent limitations of voice recognition software.  Eustaquio Maize, PA-C 09/25/20 1538    Blanchie Dessert, MD 09/25/20 [phone]

## 2020-09-25 NOTE — ED Provider Notes (Signed)
Emergency Department Provider Note   I have reviewed the triage vital signs and the nursing notes.   HISTORY  Chief Complaint Abdominal Pain   HPI Marie Hebert is a 87 y.o. female with past medical history reviewed below presents to the emergency department with abdominal pain and vomiting starting yesterday.  Patient is having more diffuse pain which has continued through the night and made it difficult for her to sleep.  She has not had fevers or chills.  She had vomiting yesterday after eating an orange and noted that the vomit was undigested.  She has not had diarrhea.  She is passing flatus.  She has no prior abdominal surgical history.  She is not having dysuria, hesitancy, urgency.  No radiation of symptoms or other modifying factors.  Past Medical History:  Diagnosis Date  . Back pain   . Dizziness   . Hypertension   . Palpitations     Patient Active Problem List   Diagnosis Date Noted  . Essential hypertension 07/12/2017  . PAC (premature atrial contraction) 07/12/2017  . Mitral regurgitation 07/12/2017  . Chest discomfort 06/13/2017  . Onychomycosis 08/10/2015  . Pain in lower limb 08/10/2015  . PCP NOTES>>> 04/06/2015  . Skin lesion 03/18/2013  . Annual physical exam 12/31/2011  . DJD (degenerative joint disease) 12/31/2011  . Vitamin D deficiency 12/31/2011  . Recurrent UTI 10/10/2011  . PALPITATIONS, OCCASIONAL 10/14/2007  . BACK PAIN 09/16/2007    Past Surgical History:  Procedure Laterality Date  . CATARACT EXTRACTION  2010   right  . COLONOSCOPY  2003  . RETINAL LASER PROCEDURE      Allergies Aspirin and Ibuprofen  Family History  Problem Relation Age of Onset  . Cancer Father   . Stomach cancer Father        h pylori  . Diabetes Son   . CAD Neg Hx   . Colon cancer Neg Hx   . Breast cancer Neg Hx     Social History Social History   Tobacco Use  . Smoking status: Never Smoker  . Smokeless tobacco: Never Used  Vaping Use   . Vaping Use: Never used  Substance Use Topics  . Alcohol use: No    Alcohol/week: 0.0 standard drinks  . Drug use: No    Review of Systems  Constitutional: No fever/chills Eyes: No visual changes. ENT: No sore throat. Cardiovascular: Denies chest pain. Respiratory: Denies shortness of breath. Gastrointestinal: Positive abdominal pain. Positive nausea and vomiting.  No diarrhea.  No constipation. Genitourinary: Negative for dysuria. Musculoskeletal: Negative for back pain. Skin: Negative for rash. Neurological: Negative for headaches, focal weakness or numbness.  10-point ROS otherwise negative.  ____________________________________________   PHYSICAL EXAM:  VITAL SIGNS: ED Triage Vitals  Enc Vitals Group     BP 09/25/20 0830 116/64     Pulse Rate 09/25/20 0830 96     Resp 09/25/20 0830 18     Temp 09/25/20 0830 97.9 F (36.6 C)     Temp Source 09/25/20 0830 Oral     SpO2 09/25/20 0830 97 %     Weight 09/25/20 0831 170 lb (77.1 kg)     Height 09/25/20 0831 5' (1.524 m)   Constitutional: Alert and oriented. Well appearing and in no acute distress. Eyes: Conjunctivae are normal.  Head: Atraumatic. Nose: No congestion/rhinnorhea. Mouth/Throat: Mucous membranes are moist.  Neck: No stridor.   Cardiovascular: Normal rate, regular rhythm. Good peripheral circulation. Grossly normal heart sounds.  Respiratory: Normal respiratory effort.  No retractions. Lungs CTAB. Gastrointestinal: Soft with focal tenderness in the RLQ No distention.  Musculoskeletal: No lower extremity tenderness nor edema. No gross deformities of extremities. Neurologic:  Normal speech and language. No gross focal neurologic deficits are appreciated.  Skin:  Skin is warm, dry and intact. No rash noted.  ____________________________________________   LABS (all labs ordered are listed, but only abnormal results are displayed)  Labs Reviewed  COMPREHENSIVE METABOLIC PANEL - Abnormal; Notable for  the following components:      Result Value   Sodium 134 (*)    Glucose, Bld 147 (*)    All other components within normal limits  CBC WITH DIFFERENTIAL/PLATELET - Abnormal; Notable for the following components:   WBC 19.0 (*)    Neutro Abs 18.1 (*)    Lymphs Abs 0.4 (*)    Abs Immature Granulocytes 0.11 (*)    All other components within normal limits  LIPASE, BLOOD  URINALYSIS, ROUTINE W REFLEX MICROSCOPIC   ____________________________________________  RADIOLOGY  CT ABDOMEN PELVIS WO CONTRAST  Result Date: 09/25/2020 CLINICAL DATA:  Nausea and vomiting with abdominal pain since yesterday. Possible food stuck in esophagus yesterday with vomiting. EXAM: CT ABDOMEN AND PELVIS WITHOUT CONTRAST TECHNIQUE: Multidetector CT imaging of the abdomen and pelvis was performed following the standard protocol without IV contrast. COMPARISON:  CT urogram 05/05/2013 FINDINGS: Lower chest: Lung bases are clear. Hepatobiliary: Liver, gallbladder and biliary tree are normal. Pancreas: Normal. Spleen: Normal. Adrenals/Urinary Tract: Adrenal glands are normal. Kidneys are normal in size without hydronephrosis or nephrolithiasis. There are multiple parapelvic left renal cysts slightly more prominent compared to the previous exam. 1.7 cm cyst over the upper pole right kidney and subcentimeter cyst over the right mid pole. Ureters and bladder are normal. Stomach/Bowel: Stomach and small bowel are normal. There is mild diverticulosis of the colon. Appendix not visualized. Vascular/Lymphatic: Mild calcified plaque over the abdominal aorta which is normal in caliber. No adenopathy. Reproductive: Normal. Other: No free fluid or focal inflammatory change. Musculoskeletal: Degenerative change of the spine with moderate disc disease at the L4-5 level. IMPRESSION: 1. No acute findings in the abdomen/pelvis. 2. Mild colonic diverticulosis. 3. Aortic atherosclerosis. 4. Bilateral renal cysts. Aortic Atherosclerosis  (ICD10-I70.0). Electronically Signed   By: Marin Olp M.D.   On: 09/25/2020 10:13    ____________________________________________   PROCEDURES  Procedure(s) performed:   Procedures  None  ____________________________________________   INITIAL IMPRESSION / ASSESSMENT AND PLAN / ED COURSE  Pertinent labs & imaging results that were available during my care of the patient were reviewed by me and considered in my medical decision making (see chart for details).   Patient presents to the emergency department with abdominal pain and vomiting.  She has focal tenderness in the right lower quadrant and I do have some suspicion for acute appendicitis.  She is afebrile and vitals look well.  Could be gastroenteritis.  Lower suspicion for acute cholecystitis with lack of pain in the right upper quadrant.    Lab work reviewed showing leukocytosis to 19.  Lipase is normal.  Bilirubin and LFTs are normal.  No UTI symptoms.  I sent the patient for Noncon CT abdomen pelvis as my CT scanner is currently not able to infuse contrast.  This showed no acute findings but did not visualize the appendix which is where the patient is tender.  With the leukocytosis I would like to send the patient to get a CT scan with contrast which  may help to better visualize the appendix.  She is not septic clinically.   Discussed the case with Dr. Ronnald Nian who accepts the patient to the [facility] ED for CT.    ____________________________________________  FINAL CLINICAL IMPRESSION(S) / ED DIAGNOSES  Final diagnoses:  RLQ abdominal pain    MEDICATIONS GIVEN DURING THIS VISIT:  Medications  ondansetron (ZOFRAN) injection 4 mg (4 mg Intravenous Not Given 09/25/20 0858)     Note:  This document was prepared using Dragon voice recognition software and may include unintentional dictation errors.  Nanda Quinton, MD, [facility]    Tiajah Oyster, [facility], MD 09/25/20 [phone]

## 2020-09-25 NOTE — ED Triage Notes (Signed)
Pt reports constipation and abdominal pain yesterday. Reports 'normal BM' last night after midnight. Pt ambulatory. No acute distress noted. Reports nausea yesterday, denies at this time.

## 2020-09-25 NOTE — ED Notes (Signed)
Patient transported to CT 

## 2020-09-25 NOTE — Anesthesia Procedure Notes (Signed)
Procedure Name: Intubation Date/Time: 09/25/2020 8:09 PM Performed by: Suzy Bouchard, CRNA Pre-anesthesia Checklist: Patient identified, Emergency Drugs available, Suction available, Patient being monitored and Timeout performed Patient Re-evaluated:Patient Re-evaluated prior to induction Oxygen Delivery Method: Circle system utilized Preoxygenation: Pre-oxygenation with 100% oxygen Induction Type: IV induction and Rapid sequence Laryngoscope Size: Miller and 2 Grade View: Grade I Tube type: Oral Tube size: 7.5 mm Number of attempts: 1 Airway Equipment and Method: Stylet Placement Confirmation: ETT inserted through vocal cords under direct vision,  positive ETCO2 and breath sounds checked- equal and bilateral Secured at: 22 cm Tube secured with: Tape Dental Injury: Teeth and Oropharynx as per pre-operative assessment

## 2020-09-25 NOTE — Progress Notes (Signed)
Marie Hebert is a 87 y.o. female patient admitted. Awake, alert - oriented  X 4 - no acute distress noted.  VSS - Blood pressure (!) 119/54, pulse 73, temperature 98.8 F (37.1 C), temperature source Oral, resp. rate 17, height 5' (1.524 m), weight 77.1 kg, SpO2 97 %.    IV in place, occlusive dsg intact without redness.   Will cont to eval and treat per MD orders.  Vidal Schwalbe, RN 09/25/2020 11:48 PM

## 2020-09-25 NOTE — Anesthesia Preprocedure Evaluation (Addendum)
Anesthesia Evaluation  Patient identified by MRN, date of birth, ID band Patient awake    Reviewed: Allergy & Precautions, H&P , NPO status , Patient's Chart, lab work & pertinent test results  Airway Mallampati: II   Neck ROM: full    Dental  (+) Partial Upper, Teeth Intact, Dental Advisory Given   Pulmonary neg pulmonary ROS,    breath sounds clear to auscultation       Cardiovascular hypertension, + Valvular Problems/Murmurs MR  Rhythm:regular Rate:Normal  TTE (2019): EF 55%, moderate MR, mildly dilated LA.   Neuro/Psych    GI/Hepatic appendicitis   Endo/Other    Renal/GU      Musculoskeletal  (+) Arthritis ,   Abdominal   Peds  Hematology   Anesthesia Other Findings   Reproductive/Obstetrics                            Anesthesia Physical Anesthesia Plan  ASA: II  Anesthesia Plan: General   Post-op Pain Management:    Induction: Intravenous  PONV Risk Score and Plan: 3 and Ondansetron, Dexamethasone and Treatment may vary due to age or medical condition  Airway Management Planned: Oral ETT  Additional Equipment:   Intra-op Plan:   Post-operative Plan: Extubation in OR  Informed Consent: I have reviewed the patients History and Physical, chart, labs and discussed the procedure including the risks, benefits and alternatives for the proposed anesthesia with the patient or authorized representative who has indicated his/her understanding and acceptance.     Dental advisory given  Plan Discussed with: CRNA, Anesthesiologist and Surgeon  Anesthesia Plan Comments:         Anesthesia Quick Evaluation

## 2020-09-25 NOTE — H&P (Signed)
Reason for Consult: abdominal pain Referring Physician: Merrily Pew Long  Hebert Marie Hebert is an 87 y.o. female.  HPI: 87 yo female with 1 day of abdominal pain. Pain is worsening today. It is RLQ and constant. It does not radiate. Movement makes the pain worse. Pain medications help a little. She has some nausea but no vomiting.  Past Medical History:  Diagnosis Date  . Back pain   . Dizziness   . Hypertension   . Palpitations     Past Surgical History:  Procedure Laterality Date  . CATARACT EXTRACTION  2010   right  . COLONOSCOPY  2003  . RETINAL LASER PROCEDURE      Family History  Problem Relation Age of Onset  . Cancer Father   . Stomach cancer Father        h pylori  . Diabetes Son   . CAD Neg Hx   . Colon cancer Neg Hx   . Breast cancer Neg Hx     Social History:  reports that she has never smoked. She has never used smokeless tobacco. She reports that she does not drink alcohol and does not use drugs.  Allergies:  Allergies  Allergen Reactions  . Aspirin   . Ibuprofen Other (See Comments)    Medications: I have reviewed the patient's current medications.  Results for orders placed or performed during the hospital encounter of 09/25/20 (from the past 48 hour(s))  Comprehensive metabolic panel     Status: Abnormal   Collection Time: 09/25/20  8:51 AM  Result Value Ref Range   Sodium 134 (L) 135 - 145 mmol/L   Potassium 3.5 3.5 - 5.1 mmol/L   Chloride 99 98 - 111 mmol/L   CO2 23 22 - 32 mmol/L   Glucose, Bld 147 (H) 70 - 99 mg/dL    Comment: Glucose reference range applies only to samples taken after fasting for at least 8 hours.   BUN 14 8 - 23 mg/dL   Creatinine, Ser 0.60 0.44 - 1.00 mg/dL   Calcium 9.1 8.9 - 10.3 mg/dL   Total Protein 6.7 6.5 - 8.1 g/dL   Albumin 3.7 3.5 - 5.0 g/dL   AST 25 15 - 41 U/L   ALT 15 0 - 44 U/L   Alkaline Phosphatase 52 38 - 126 U/L   Total Bilirubin 0.8 0.3 - 1.2 mg/dL   GFR, Estimated >60 >60 mL/min    Comment:  (NOTE) Calculated using the CKD-EPI Creatinine Equation (2021)    Anion gap 12 5 - 15    Comment: Performed at [facility], [address] [place], [place] [address]  Lipase, blood     Status: None   Collection Time: 09/25/20  8:51 AM  Result Value Ref Range   Lipase 29 11 - 51 U/L    Comment: Performed at [facility], [address] [place], [place] [address]  CBC with Differential     Status: Abnormal   Collection Time: 09/25/20  8:51 AM  Result Value Ref Range   WBC 19.0 (H) 4.0 - 10.5 K/uL   RBC 4.73 3.87 - 5.11 MIL/uL   Hemoglobin 14.2 12.0 - 15.0 g/dL   HCT 41.7 36.0 - 46.0 %   MCV 88.2 80.0 - 100.0 fL   MCH 30.0 26.0 - 34.0 pg   MCHC 34.1 30.0 - 36.0 g/dL   RDW 14.1 11.5 - 15.5 %   Platelets 201 150 - 400 K/uL  nRBC 0.0 0.0 - 0.2 %   Neutrophils Relative % 94 %   Neutro Abs 18.1 (H) 1.7 - 7.7 K/uL   Lymphocytes Relative 2 %   Lymphs Abs 0.4 (L) 0.7 - 4.0 K/uL   Monocytes Relative 3 %   Monocytes Absolute 0.5 0.1 - 1.0 K/uL   Eosinophils Relative 0 %   Eosinophils Absolute 0.0 0.0 - 0.5 K/uL   Basophils Relative 0 %   Basophils Absolute 0.1 0.0 - 0.1 K/uL   Immature Granulocytes 1 %   Abs Immature Granulocytes 0.11 (H) 0.00 - 0.07 K/uL    Comment: Performed at [facility], [address] [place], [place] [address]    CT ABDOMEN PELVIS WO CONTRAST  Result Date: 09/25/2020 CLINICAL DATA:  Nausea and vomiting with abdominal pain since yesterday. Possible food stuck in esophagus yesterday with vomiting. EXAM: CT ABDOMEN AND PELVIS WITHOUT CONTRAST TECHNIQUE: Multidetector CT imaging of the abdomen and pelvis was performed following the standard protocol without IV contrast. COMPARISON:  CT urogram 05/05/2013 FINDINGS: Lower chest: Lung bases are clear. Hepatobiliary: Liver, gallbladder and biliary tree are normal. Pancreas: Normal. Spleen: Normal. Adrenals/Urinary Tract: Adrenal glands are normal. Kidneys are normal in size without  hydronephrosis or nephrolithiasis. There are multiple parapelvic left renal cysts slightly more prominent compared to the previous exam. 1.7 cm cyst over the upper pole right kidney and subcentimeter cyst over the right mid pole. Ureters and bladder are normal. Stomach/Bowel: Stomach and small bowel are normal. There is mild diverticulosis of the colon. Appendix not visualized. Vascular/Lymphatic: Mild calcified plaque over the abdominal aorta which is normal in caliber. No adenopathy. Reproductive: Normal. Other: No free fluid or focal inflammatory change. Musculoskeletal: Degenerative change of the spine with moderate disc disease at the L4-5 level. IMPRESSION: 1. No acute findings in the abdomen/pelvis. 2. Mild colonic diverticulosis. 3. Aortic atherosclerosis. 4. Bilateral renal cysts. Aortic Atherosclerosis (ICD10-I70.0). Electronically Signed   By: Marin Olp M.D.   On: 09/25/2020 10:13   CT ABDOMEN PELVIS W CONTRAST  Result Date: 09/25/2020 CLINICAL DATA:  RIGHT lower quadrant pain. Suspicion for appendicitis. 87 year old female. Nausea and vomiting EXAM: CT ABDOMEN AND PELVIS WITH CONTRAST TECHNIQUE: Multidetector CT imaging of the abdomen and pelvis was performed using the standard protocol following bolus administration of intravenous contrast. CONTRAST:  139mL OMNIPAQUE IOHEXOL 300 MG/ML  SOLN COMPARISON:  None. FINDINGS: Lower chest: Lung bases are clear. Hepatobiliary: No focal hepatic lesion. No biliary duct dilatation. Common bile duct is normal. Pancreas: Pancreas is normal. No ductal dilatation. No pancreatic inflammation. Spleen: Normal spleen Adrenals/urinary tract: Adrenal glands and kidneys are normal. The ureters and bladder normal. Stomach/Bowel: Small hiatal hernia. Stomach, duodenum small-bowel normal. Terminal ileum normal. The appendix extends inferior from the cecum into the iliac fossa on the RIGHT. Appendix is relatively short measuring only 4-5 cm. The appendix is fluid-filled  and dilated to 11 mm (image 60/series 3). There is mild mucosal enhancement. Minimal periappendiceal fluid or inflammation. The ascending colon and transverse colon normal. There are several diverticula descending colon without acute inflammation. Rectum normal Vascular/Lymphatic: Abdominal aorta is normal caliber. No periportal or retroperitoneal adenopathy. No pelvic adenopathy. Reproductive: Uterus and adnexa unremarkable. Other: No free fluid. Musculoskeletal: No aggressive osseous lesion. IMPRESSION: 1. Fluid-filled mildly dilated appendix with mucosal enhancement. Findings concerning for early acute appendicitis. Minimal periappendiceal inflammation. No abscess or rupture present. 2. Mild LEFT colon diverticulosis without evidence diverticulitis. Electronically Signed   By: Helane Gunther.D.  On: 09/25/2020 15:05    Review of Systems  Constitutional: Negative for chills and fever.  HENT: Negative for hearing loss.   Eyes: Negative for blurred vision and double vision.  Respiratory: Negative for cough and hemoptysis.   Cardiovascular: Negative for chest pain and palpitations.  Gastrointestinal: Positive for abdominal pain and nausea. Negative for vomiting.  Genitourinary: Negative for dysuria and urgency.  Musculoskeletal: Negative for myalgias and neck pain.  Skin: Negative for itching and rash.  Neurological: Negative for dizziness, tingling and headaches.  Endo/Heme/Allergies: Does not bruise/bleed easily.  Psychiatric/Behavioral: Negative for depression and suicidal ideas.    PE Blood pressure 127/65, pulse 75, temperature 98.1 F (36.7 C), temperature source Oral, resp. rate 20, height 5' (1.524 m), weight 77.1 kg, SpO2 95 %. Constitutional: NAD; conversant; no deformities Eyes: Moist conjunctiva; no lid lag; anicteric; PERRL Neck: Trachea midline; no thyromegaly Lungs: Normal respiratory effort; no tactile fremitus CV: RRR; no palpable thrills; no pitting edema GI: Abd  tenderness RLQ with guarding; no palpable hepatosplenomegaly MSK: Normal gait; no clubbing/cyanosis Psychiatric: Appropriate affect; alert and oriented x3 Lymphatic: No palpable cervical or axillary lymphadenopathy Skin: No major subcutaneous nodules. Warm and dry   Assessment/Plan: 87 yo healthy female with acute appendicitis -lap appendectomy -we discussed the details of the procedure; that it would be done under general anesthesia, that we would attempt to do the procedure laparoscopically. That the appendix would be isolated from the large and small intestine and then ligated and removed. We discussed the reason for this is to avoid rupture and infection and resolve the pains. We discussed risks of infection, abscess, injury to intestines or urinary structures, and need for open incision. She showed good understanding and wanted to proceed. -obs stay postop  Arta Bruce Laurice Iglesia 09/25/2020, 5:04 PM

## 2020-09-25 NOTE — ED Notes (Signed)
Report called to ED charge at [facility] ER. Notified of Transfer via POV for CT scan with contrast. Will notify CT as well. Pt transferred with IV in place, secured with coban, notified to go straight to [facility] ED and make no stops on the way.

## 2020-09-25 NOTE — Transfer of Care (Signed)
Immediate Anesthesia Transfer of Care Note  Patient: Marie Hebert  Procedure(s) Performed: APPENDECTOMY LAPAROSCOPIC (N/A )  Patient Location: PACU  Anesthesia Type:General  Level of Consciousness: awake, alert  and oriented  Airway & Oxygen Therapy: Patient Spontanous Breathing and Patient connected to nasal cannula oxygen  Post-op Assessment: Report given to RN and Post -op Vital signs reviewed and stable  Post vital signs: Reviewed and stable  Last Vitals:  Vitals Value Taken Time  BP 143/69 09/25/20 2106  Temp 37.5 C 09/25/20 2105  Pulse 85 09/25/20 2110  Resp 17 09/25/20 2110  SpO2 93 % 09/25/20 2110  Vitals shown include unvalidated device data.  Last Pain:  Vitals:   09/25/20 1744  TempSrc: Oral  PainSc: 4          Complications: No complications documented.

## 2020-09-25 NOTE — Op Note (Signed)
Preoperative diagnosis: acute appendicitis  Postoperative diagnosis: Same   Procedure: laparoscopic appendectomy  Surgeon: Gurney Maxin, M.D.  Asst: none  Anesthesia: Gen.   Indications for procedure: Marie Hebert is a 87 y.o. female with symptoms of pain in right lower quadrant and nausea consistent with acute appendicitis. Confirmed by CT.  Description of procedure: The patient was brought into the operative suite, placed supine. Anesthesia was administered with endotracheal tube. The patient's left arm was tucked. All pressure points were offloaded by foam padding. The patient was prepped and draped in the usual sterile fashion.  A transverse incision was made to the left of the umbilicus and a 78mm trocar was [place]. Pneumoperitoneum was applied with high flow low pressure.  2 83mm trocars were placed, one in the suprapubic space, one in the LLQ, the periumbilical incision was then up-sized and a 65mm trocar placed in that space. A transversus abdominal block was placed on the left and right sides. Next, the patient was placed in trendelenberg, rotated to the left. The omentum was retracted cephalad. The cecum and appendix were identified. The appendix was inflamed without perforation. The base of the appendix was dissected and a window through the mesoappendix was created with blunt dissection. A 67mm blue load stapler was used to cut the appendix at its base. Next a 0 PDS endo loop was placed across the mesoappendix and the mesoappendix was cut with cautery.  The appendix was placed in a specimen bag. The pelvis and RLQ were irrigated. Small amount of yellow fluid was seen in the pelvis and removed. The appendix was removed via the 12 mm trocar. 0 vicryl was used to close the fascial defect. Pneumoperitoneum was removed, all trocars were removed. All incisions were closed with 4-0 monocryl subcuticular stitch. The patient woke from anesthesia and was brought to PACU in stable  condition.  Findings: acute appendicitis  Specimen: appendix  Blood loss: 10 ml  Local anesthesia: 30 ml Marcaine  Complications: none  Gurney Maxin, M.D. General, Bariatric, & Minimally Invasive Surgery [facility]

## 2020-09-26 ENCOUNTER — Encounter (HOSPITAL_COMMUNITY): Payer: Self-pay | Admitting: General Surgery

## 2020-09-26 LAB — CBC
HCT: 38.3 % (ref 36.0–46.0)
HCT: 38.5 % (ref 36.0–46.0)
Hemoglobin: 12.6 g/dL (ref 12.0–15.0)
Hemoglobin: 13.1 g/dL (ref 12.0–15.0)
MCH: 29.3 pg (ref 26.0–34.0)
MCH: 30.5 pg (ref 26.0–34.0)
MCHC: 32.9 g/dL (ref 30.0–36.0)
MCHC: 34 g/dL (ref 30.0–36.0)
MCV: 89.1 fL (ref 80.0–100.0)
MCV: 89.7 fL (ref 80.0–100.0)
Platelets: 179 10*3/uL (ref 150–400)
Platelets: 180 10*3/uL (ref 150–400)
RBC: 4.29 MIL/uL (ref 3.87–5.11)
RBC: 4.3 MIL/uL (ref 3.87–5.11)
RDW: 14.4 % (ref 11.5–15.5)
RDW: 14.5 % (ref 11.5–15.5)
WBC: 12.9 10*3/uL — ABNORMAL HIGH (ref 4.0–10.5)
WBC: 9.9 10*3/uL (ref 4.0–10.5)
nRBC: 0 % (ref 0.0–0.2)
nRBC: 0 % (ref 0.0–0.2)

## 2020-09-26 LAB — CREATININE, SERUM
Creatinine, Ser: 0.68 mg/dL (ref 0.44–1.00)
GFR, Estimated: >60 mL/min (ref >60)

## 2020-09-26 MED ORDER — OXYCODONE HCL 5 MG PO TABS: 5.0000 mg | ORAL_TABLET | Freq: Four times a day (QID) | ORAL | 0 refills | Status: DC | PRN

## 2020-09-26 MED ORDER — ACETAMINOPHEN 325 MG PO TABS: 650.0000 mg | ORAL_TABLET | Freq: Four times a day (QID) | ORAL | Status: DC | PRN

## 2020-09-26 NOTE — Progress Notes (Signed)
Pt will be going home, no discomforts or needs at this time.

## 2020-09-26 NOTE — Discharge Summary (Signed)
[facility] Discharge Summary   Patient ID: Marie Hebert MRN: [id number] DOB/AGE: Jun 24, 1933 87 y.o.  Admit date: 09/25/2020 Discharge date: 09/26/2020  Admitting Diagnosis: Acute appendicitis   Discharge Diagnosis S/p laparoscopic appendectomy   Consultants None   Imaging: CT ABDOMEN PELVIS WO CONTRAST  Result Date: 09/25/2020 CLINICAL DATA:  Nausea and vomiting with abdominal pain since yesterday. Possible food stuck in esophagus yesterday with vomiting. EXAM: CT ABDOMEN AND PELVIS WITHOUT CONTRAST TECHNIQUE: Multidetector CT imaging of the abdomen and pelvis was performed following the standard protocol without IV contrast. COMPARISON:  CT urogram 05/05/2013 FINDINGS: Lower chest: Lung bases are clear. Hepatobiliary: Liver, gallbladder and biliary tree are normal. Pancreas: Normal. Spleen: Normal. Adrenals/Urinary Tract: Adrenal glands are normal. Kidneys are normal in size without hydronephrosis or nephrolithiasis. There are multiple parapelvic left renal cysts slightly more prominent compared to the previous exam. 1.7 cm cyst over the upper pole right kidney and subcentimeter cyst over the right mid pole. Ureters and bladder are normal. Stomach/Bowel: Stomach and small bowel are normal. There is mild diverticulosis of the colon. Appendix not visualized. Vascular/Lymphatic: Mild calcified plaque over the abdominal aorta which is normal in caliber. No adenopathy. Reproductive: Normal. Other: No free fluid or focal inflammatory change. Musculoskeletal: Degenerative change of the spine with moderate disc disease at the L4-5 level. IMPRESSION: 1. No acute findings in the abdomen/pelvis. 2. Mild colonic diverticulosis. 3. Aortic atherosclerosis. 4. Bilateral renal cysts. Aortic Atherosclerosis (ICD10-I70.0). Electronically Signed   By: Marin Olp M.D.   On: 09/25/2020 10:13   CT ABDOMEN PELVIS W CONTRAST  Result Date: 09/25/2020 CLINICAL DATA:  RIGHT lower quadrant pain.  Suspicion for appendicitis. 87 year old female. Nausea and vomiting EXAM: CT ABDOMEN AND PELVIS WITH CONTRAST TECHNIQUE: Multidetector CT imaging of the abdomen and pelvis was performed using the standard protocol following bolus administration of intravenous contrast. CONTRAST:  164mL OMNIPAQUE IOHEXOL 300 MG/ML  SOLN COMPARISON:  None. FINDINGS: Lower chest: Lung bases are clear. Hepatobiliary: No focal hepatic lesion. No biliary duct dilatation. Common bile duct is normal. Pancreas: Pancreas is normal. No ductal dilatation. No pancreatic inflammation. Spleen: Normal spleen Adrenals/urinary tract: Adrenal glands and kidneys are normal. The ureters and bladder normal. Stomach/Bowel: Small hiatal hernia. Stomach, duodenum small-bowel normal. Terminal ileum normal. The appendix extends inferior from the cecum into the iliac fossa on the RIGHT. Appendix is relatively short measuring only 4-5 cm. The appendix is fluid-filled and dilated to 11 mm (image 60/series 3). There is mild mucosal enhancement. Minimal periappendiceal fluid or inflammation. The ascending colon and transverse colon normal. There are several diverticula descending colon without acute inflammation. Rectum normal Vascular/Lymphatic: Abdominal aorta is normal caliber. No periportal or retroperitoneal adenopathy. No pelvic adenopathy. Reproductive: Uterus and adnexa unremarkable. Other: No free fluid. Musculoskeletal: No aggressive osseous lesion. IMPRESSION: 1. Fluid-filled mildly dilated appendix with mucosal enhancement. Findings concerning for early acute appendicitis. Minimal periappendiceal inflammation. No abscess or rupture present. 2. Mild LEFT colon diverticulosis without evidence diverticulitis. Electronically Signed   By: Suzy Bouchard M.D.   On: 09/25/2020 15:05    Procedures Dr. Lurena Joiner Kinsinger (09/25/20) - Laparoscopic Appendectomy  Hospital Course:  Patient is an 87 year old female who presented to [facility] with abdominal pain.   Workup showed acute appendicitis.  Patient was admitted and underwent procedure listed above.  Tolerated procedure well and was transferred to the floor.  Diet was advanced as tolerated.  On POD#1, the patient was voiding well, tolerating diet, ambulating well, pain well controlled,  vital signs stable, incisions c/d/i and felt stable for discharge home.  Patient will follow up in our office in 3 weeks and knows to call with questions or concerns. She will call to confirm appointment date/time.    Physical Exam: General:  Alert, NAD, pleasant, comfortable Abd:  Soft, ND, mild tenderness, incisions C/D/I  I or a member of my team have reviewed this patient in the Controlled Substance Database.   Allergies as of 09/26/2020      Reactions   Aspirin    Ibuprofen Other (See Comments)      Medication List    TAKE these medications   acetaminophen 325 MG tablet Commonly known as: TYLENOL Take 2 tablets (650 mg total) by mouth every 6 (six) hours as needed for mild pain (or temp > 100).   diltiazem 120 MG 24 hr capsule Commonly known as: CARDIZEM CD Take 1 capsule (120 mg total) by mouth daily.   Fish Oil 1000 MG Caps Take 1 capsule by mouth daily.   Magnesium 500 MG Caps Take 1 capsule by mouth daily.   oxyCODONE 5 MG immediate release tablet Commonly known as: Oxy IR/ROXICODONE Take 1 tablet (5 mg total) by mouth every 6 (six) hours as needed for moderate pain.   PRESERVISION AREDS 2 PO Take by mouth.   ONE-A-DAY WOMENS 50+ PO Take 1 tablet by mouth daily.   vitamin C 500 MG tablet Commonly known as: ASCORBIC ACID Take 500 mg by mouth daily.   Vitamin D 125 MCG (5000 UT) Caps Take 1 capsule by mouth daily.         Follow-up Information    [facility] [place]. Go on 10/18/2020.   Specialty: General Surgery Why: Appointment scheduled for 9:45 AM. Please arrive 30 min prior to appointment time. Bring photo ID and insurance information.  Contact information: [place] [address] [phone]               Signed: Norm Parcel , [facility] 09/26/2020, 8:34 AM Please see Amion for pager number during day hours 7:00am-4:30pm

## 2020-09-26 NOTE — Discharge Instructions (Signed)
[facility] LAPAROSCOPIC SURGERY: POST OP INSTRUCTIONS Always review your discharge instruction sheet given to you by the facility where your surgery was performed. IF YOU HAVE DISABILITY OR FAMILY LEAVE FORMS, YOU MUST BRING THEM TO THE OFFICE FOR PROCESSING.   DO NOT GIVE THEM TO YOUR DOCTOR.  PAIN CONTROL  1. First take acetaminophen (Tylenol) AND/or ibuprofen (Advil) to control your pain after surgery.  Follow directions on package.  Taking acetaminophen (Tylenol) and/or ibuprofen (Advil) regularly after surgery will help to control your pain and lower the amount of prescription pain medication you may need.  You should not take more than 3,000 mg (3 grams) of acetaminophen (Tylenol) in 24 hours.  You should not take ibuprofen (Advil), aleve, motrin, naprosyn or other NSAIDS if you have a history of stomach ulcers or chronic kidney disease.  2. A prescription for pain medication may be given to you upon discharge.  Take your pain medication as prescribed, if you still have uncontrolled pain after taking acetaminophen (Tylenol) or ibuprofen (Advil). 3. Use ice packs to help control pain. 4. If you need a refill on your pain medication, please contact your pharmacy.  They will contact our office to request authorization. Prescriptions will not be filled after 5pm or on week-ends.  HOME MEDICATIONS 5. Take your usually prescribed medications unless otherwise directed.  DIET 6. You should follow a light diet the first few days after arrival home.  Be sure to include lots of fluids daily. Avoid fatty, fried foods.   CONSTIPATION 7. It is common to experience some constipation after surgery and if you are taking pain medication.  Increasing fluid intake and taking a stool softener (such as Colace) will usually help or prevent this problem from occurring.  A mild laxative (Milk of Magnesia or Miralax) should be taken according to package instructions if there are no bowel  movements after 48 hours.  WOUND/INCISION CARE 8. Most patients will experience some swelling and bruising in the area of the incisions.  Ice packs will help.  Swelling and bruising can take several days to resolve.  9. Unless discharge instructions indicate otherwise, follow guidelines below  a. STERI-STRIPS - you may remove your outer bandages 48 hours after surgery, and you may shower at that time.  You have steri-strips (small skin tapes) in place directly over the incision.  These strips should be left on the skin for 7-10 days.   b. DERMABOND/SKIN GLUE - you may shower in 24 hours.  The glue will flake off over the next 2-3 weeks. 10. Any sutures or staples will be removed at the office during your follow-up visit.  ACTIVITIES 11. You may resume regular (light) daily activities beginning the next day--such as daily self-care, walking, climbing stairs--gradually increasing activities as tolerated.  You may have sexual intercourse when it is comfortable.  Refrain from any heavy lifting or straining until approved by your doctor. a. You may drive when you are no longer taking prescription pain medication, you can comfortably wear a seatbelt, and you can safely maneuver your car and apply brakes.  FOLLOW-UP 12. You should see your doctor in the office for a follow-up appointment approximately 2-3 weeks after your surgery.  You should have been given your post-op/follow-up appointment when your surgery was scheduled.  If you did not receive a post-op/follow-up appointment, make sure that you call for this appointment within a day or two after you arrive home to insure a convenient appointment time.     WHEN TO CALL YOUR DOCTOR: 1. Fever over 101.0 2. Inability to urinate 3. Continued bleeding from incision. 4. Increased pain, redness, or drainage from the incision. 5. Increasing abdominal pain  The clinic staff is available to answer your questions during regular business hours.  Please don't  hesitate to call and ask to speak to one of the nurses for clinical concerns.  If you have a medical emergency, go to the nearest emergency room or call 911.  A surgeon from [facility] is always on call at the hospital. [address], [address], [place], [place]  [address] ? [address] [address], [place], [place]   [address] [phone] ? [phone] ? FAX [phone] Web site: [email or web address]  .........   Managing Your Pain After Surgery Without Opioids    Thank you for participating in our program to help patients manage their pain after surgery without opioids. This is part of our effort to provide you with the best care possible, without exposing you or your family to the risk that opioids pose.  What pain can I expect after surgery? You can expect to have some pain after surgery. This is normal. The pain is typically worse the day after surgery, and quickly begins to get better. Many studies have found that many patients are able to manage their pain after surgery with Over-the-Counter (OTC) medications such as Tylenol and Motrin. If you have a condition that does not allow you to take Tylenol or Motrin, notify your surgical team.  How will I manage my pain? The best strategy for controlling your pain after surgery is around the clock pain control with Tylenol (acetaminophen) and Motrin (ibuprofen or Advil). Alternating these medications with each other allows you to maximize your pain control. In addition to Tylenol and Motrin, you can use heating pads or ice packs on your incisions to help reduce your pain.  How will I alternate your regular strength over-the-counter pain medication? You will take a dose of pain medication every three hours. ; Start by taking 650 mg of Tylenol (2 pills of 325 mg) ; 3 hours later take 600 mg of Motrin (3 pills of 200 mg) ; 3 hours after taking the Motrin take 650 mg of Tylenol ; 3 hours after that take 600 mg of  Motrin.   - 1 -  See example - if your first dose of Tylenol is at 12:00 PM   12:00 PM Tylenol 650 mg (2 pills of 325 mg)  3:00 PM Motrin 600 mg (3 pills of 200 mg)  6:00 PM Tylenol 650 mg (2 pills of 325 mg)  9:00 PM Motrin 600 mg (3 pills of 200 mg)  Continue alternating every 3 hours   We recommend that you follow this schedule around-the-clock for at least 3 days after surgery, or until you feel that it is no longer needed. Use the table on the last page of this handout to keep track of the medications you are taking. Important: Do not take more than 3000mg of Tylenol or 3200mg of Motrin in a 24-hour period. Do not take ibuprofen/Motrin if you have a history of bleeding stomach ulcers, severe kidney disease, &/or actively taking a blood thinner  What if I still have pain? If you have pain that is not controlled with the over-the-counter pain medications (Tylenol and Motrin or Advil) you might have what we call "breakthrough" pain. You will receive a prescription for a small amount of an opioid pain medication such as   Oxycodone, Tramadol, or Tylenol with Codeine. Use these opioid pills in the first 24 hours after surgery if you have breakthrough pain. Do not take more than 1 pill every 4-6 hours.  If you still have uncontrolled pain after using all opioid pills, don't hesitate to call our staff using the number provided. We will help make sure you are managing your pain in the best way possible, and if necessary, we can provide a prescription for additional pain medication.   Day 1    Time  Name of Medication Number of pills taken  Amount of Acetaminophen  Pain Level   Comments  AM PM       AM PM       AM PM       AM PM       AM PM       AM PM       AM PM       AM PM       Total Daily amount of Acetaminophen Do not take more than  3,000 mg per day      Day 2    Time  Name of Medication Number of pills taken  Amount of Acetaminophen  Pain Level   Comments  AM  PM       AM PM       AM PM       AM PM       AM PM       AM PM       AM PM       AM PM       Total Daily amount of Acetaminophen Do not take more than  3,000 mg per day      Day 3    Time  Name of Medication Number of pills taken  Amount of Acetaminophen  Pain Level   Comments  AM PM       AM PM       AM PM       AM PM          AM PM       AM PM       AM PM       AM PM       Total Daily amount of Acetaminophen Do not take more than  3,000 mg per day      Day 4    Time  Name of Medication Number of pills taken  Amount of Acetaminophen  Pain Level   Comments  AM PM       AM PM       AM PM       AM PM       AM PM       AM PM       AM PM       AM PM       Total Daily amount of Acetaminophen Do not take more than  3,000 mg per day      Day 5    Time  Name of Medication Number of pills taken  Amount of Acetaminophen  Pain Level   Comments  AM PM       AM PM       AM PM       AM PM       AM PM       AM PM       AM PM         AM PM       Total Daily amount of Acetaminophen Do not take more than  3,000 mg per day       Day 6    Time  Name of Medication Number of pills taken  Amount of Acetaminophen  Pain Level  Comments  AM PM       AM PM       AM PM       AM PM       AM PM       AM PM       AM PM       AM PM       Total Daily amount of Acetaminophen Do not take more than  3,000 mg per day      Day 7    Time  Name of Medication Number of pills taken  Amount of Acetaminophen  Pain Level   Comments  AM PM       AM PM       AM PM       AM PM       AM PM       AM PM       AM PM       AM PM       Total Daily amount of Acetaminophen Do not take more than  3,000 mg per day        For additional information about how and where to safely dispose of unused opioid medications - https://www.morepowerfulnc.org  Disclaimer: This document contains information and/or instructional materials adapted from [facility]  for the typical patient with your condition. It does not replace medical advice from your health care provider because your experience may differ from that of the typical patient. Talk to your health care provider if you have any questions about this document, your condition or your treatment plan. Adapted from [facility]  

## 2020-09-27 LAB — SURGICAL PATHOLOGY

## 2020-09-28 NOTE — Anesthesia Postprocedure Evaluation (Signed)
Anesthesia Post Note  Patient: Marie Hebert  Procedure(s) Performed: APPENDECTOMY LAPAROSCOPIC (N/A )     Patient location during evaluation: PACU Anesthesia Type: General Level of consciousness: awake and alert Pain management: pain level controlled Vital Signs Assessment: post-procedure vital signs reviewed and stable Respiratory status: spontaneous breathing, nonlabored ventilation, respiratory function stable and patient connected to nasal cannula oxygen Cardiovascular status: blood pressure returned to baseline and stable Postop Assessment: no apparent nausea or vomiting Anesthetic complications: no   No complications documented.  Last Vitals:  Vitals:   09/26/20 0350 09/26/20 0942  BP: (!) 120/54 (!) 118/53  Pulse: 75 69  Resp: 17 18  Temp: 36.8 C 36.5 C  SpO2: 93% 96%    Last Pain:  Vitals:   09/26/20 0942  TempSrc: Oral  PainSc:                  Newnan S

## 2020-09-30 LAB — CULTURE, BLOOD (ROUTINE X 2): Culture: NO GROWTH

## 2020-10-01 LAB — CULTURE, BLOOD (ROUTINE X 2): Culture: NO GROWTH

## 2020-10-03 ENCOUNTER — Encounter: Payer: Self-pay | Admitting: Internal Medicine

## 2020-10-03 ENCOUNTER — Telehealth (INDEPENDENT_AMBULATORY_CARE_PROVIDER_SITE_OTHER): Payer: Medicare Other | Admitting: Internal Medicine

## 2020-10-03 VITALS — Ht 60.0 in | Wt 170.0 lb

## 2020-10-03 DIAGNOSIS — I10 Essential (primary) hypertension: Secondary | ICD-10-CM | POA: Diagnosis not present

## 2020-10-03 DIAGNOSIS — R269 Unspecified abnormalities of gait and mobility: Secondary | ICD-10-CM | POA: Diagnosis not present

## 2020-10-03 NOTE — Progress Notes (Signed)
Subjective:    Patient ID: Marie Hebert, female    DOB: 01-08-1933, 87 y.o.   MRN: [id number]  DOS:  10/03/2020 Type of visit - description: Virtual Visit via Video Note  I connected with the above patient  by a video enabled telemedicine application and verified that I am speaking with the correct person using two identifiers.   THIS ENCOUNTER IS A VIRTUAL VISIT DUE TO COVID-19 - PATIENT WAS NOT SEEN IN THE OFFICE. PATIENT HAS CONSENTED TO VIRTUAL VISIT / TELEMEDICINE VISIT   Location of patient: home  Location of provider: office  Persons participating in the virtual visit: patient, provider   I discussed the limitations of evaluation and management by telemedicine and the availability of in person appointments. The patient expressed understanding and agreed to proceed.  Acute  Patient was admitted to the hospital 09/25/2020 and discharged the next day, she presented with abdominal pain, work-up show appendicitis, she had an appendectomy, tolerated well, was discharged on pain medication.  Most recent labs: Hemoglobin 12.6,  kidney function normal  She requested visit today at the request of her daughter who is visiting from [place]. Is the daughter's observation that her gait is wobbly and she feels unsure getting on the shower, would like physical therapy to strengthening her legs  Fortunately she is otherwise doing well, reports no fever chills No nausea or vomiting Appetite is okay  No recent falls.   Review of Systems See above   Past Medical History:  Diagnosis Date  . Back pain   . Dizziness   . Hypertension   . Palpitations     Past Surgical History:  Procedure Laterality Date  . CATARACT EXTRACTION  2010   right  . COLONOSCOPY  2003  . LAPAROSCOPIC APPENDECTOMY N/A 09/25/2020   Procedure: APPENDECTOMY LAPAROSCOPIC;  Surgeon: Kinsinger, Arta Bruce, MD;  Location: [facility];  Service: General;  Laterality: N/A;  . RETINAL LASER PROCEDURE      Allergies  as of 10/03/2020      Reactions   Aspirin    Ibuprofen Other (See Comments)      Medication List       Accurate as of October 03, 2020  3:28 PM. If you have any questions, ask your nurse or doctor.        acetaminophen 325 MG tablet Commonly known as: TYLENOL Take 2 tablets (650 mg total) by mouth every 6 (six) hours as needed for mild pain (or temp > 100).   diltiazem 120 MG 24 hr capsule Commonly known as: CARDIZEM CD Take 1 capsule (120 mg total) by mouth daily.   Fish Oil 1000 MG Caps Take 1 capsule by mouth daily.   Magnesium 500 MG Caps Take 1 capsule by mouth daily.   oxyCODONE 5 MG immediate release tablet Commonly known as: Oxy IR/ROXICODONE Take 1 tablet (5 mg total) by mouth every 6 (six) hours as needed for moderate pain.   PRESERVISION AREDS 2 PO Take by mouth.   ONE-A-DAY WOMENS 50+ PO Take 1 tablet by mouth daily.   vitamin C 500 MG tablet Commonly known as: ASCORBIC ACID Take 500 mg by mouth daily.   Vitamin D 125 MCG (5000 UT) Caps Take 1 capsule by mouth daily.          Objective:   Physical Exam Ht 5' (1.524 m)   Wt 170 lb (77.1 kg)   BMI 33.20 kg/m  This is a virtual video visit, she is alert oriented x3,  in no distress, she seems in good spirits.  Her daughter who is visiting from [place] was present at the visit provided very important information.    Assessment      Assessment  HTN (h/o palpitations, on cardiazem) DJD Recurrent UTIs; extensive urology w/u  2015:atrophic vaginitis, eventually decided to try hormonal creams Back pain, chronic, needs a disability parking  Palpitations: Saw cardiology 05-2017: Holter, echo, stress echo essentially negative Osteopenia: T score -1.5 (02/2016)  PLAN: Virtual visit, here with her daughter Appendectomy: Seems to be recovering well.  Chart reviewed. HTN: Last BMP done at the hospital satisfactory, continue diltiazem. Gait disorder: New issue. The patient's daughter reports a gait  disorder, she is a slightly wobbly since she left the hospital after appendectomy, she is afraid she may fall and requests physical therapy.  I agree, declined a PT at home, will refer her to a office near her house. They also request a letter stating she would benefit from a walk-in shower.  Letter prepared will mail to her.  Time spent 20 minutes I discussed the assessment and treatment plan with the patient. The patient was provided an opportunity to ask questions and all were answered. The patient agreed with the plan and demonstrated an understanding of the instructions.   The patient was advised to call back or seek an in-person evaluation if the symptoms worsen or if the condition fails to improve as anticipated.

## 2020-10-04 NOTE — Assessment & Plan Note (Signed)
Virtual visit, here with her daughter Appendectomy: Seems to be recovering well.  Chart reviewed. HTN: Last BMP done at the hospital satisfactory, continue diltiazem. Gait disorder: New issue. The patient's daughter reports a gait disorder, she is a slightly wobbly since she left the hospital after appendectomy, she is afraid she may fall and requests physical therapy.  I agree, declined a PT at home, will refer her to a office near her house. They also request a letter stating she would benefit from a walk-in shower.  Letter prepared will mail to her.

## 2020-10-11 DIAGNOSIS — M40204 Unspecified kyphosis, thoracic region: Secondary | ICD-10-CM | POA: Diagnosis not present

## 2020-10-11 DIAGNOSIS — M6281 Muscle weakness (generalized): Secondary | ICD-10-CM | POA: Diagnosis not present

## 2020-10-11 DIAGNOSIS — M170 Bilateral primary osteoarthritis of knee: Secondary | ICD-10-CM | POA: Diagnosis not present

## 2020-10-11 DIAGNOSIS — K3580 Unspecified acute appendicitis: Secondary | ICD-10-CM | POA: Diagnosis not present

## 2020-10-13 ENCOUNTER — Telehealth: Payer: Self-pay

## 2020-10-13 NOTE — Telephone Encounter (Signed)
Plan of care signed and faxed back to [facility] at [phone]. Form sent for scanning.  

## 2020-10-19 DIAGNOSIS — M6281 Muscle weakness (generalized): Secondary | ICD-10-CM | POA: Diagnosis not present

## 2020-10-19 DIAGNOSIS — M40204 Unspecified kyphosis, thoracic region: Secondary | ICD-10-CM | POA: Diagnosis not present

## 2020-10-19 DIAGNOSIS — M170 Bilateral primary osteoarthritis of knee: Secondary | ICD-10-CM | POA: Diagnosis not present

## 2020-10-19 DIAGNOSIS — K3580 Unspecified acute appendicitis: Secondary | ICD-10-CM | POA: Diagnosis not present

## 2020-10-21 DIAGNOSIS — K3580 Unspecified acute appendicitis: Secondary | ICD-10-CM | POA: Diagnosis not present

## 2020-10-21 DIAGNOSIS — M40204 Unspecified kyphosis, thoracic region: Secondary | ICD-10-CM | POA: Diagnosis not present

## 2020-10-21 DIAGNOSIS — M170 Bilateral primary osteoarthritis of knee: Secondary | ICD-10-CM | POA: Diagnosis not present

## 2020-10-21 DIAGNOSIS — M6281 Muscle weakness (generalized): Secondary | ICD-10-CM | POA: Diagnosis not present

## 2020-10-24 DIAGNOSIS — K3580 Unspecified acute appendicitis: Secondary | ICD-10-CM | POA: Diagnosis not present

## 2020-10-24 DIAGNOSIS — M170 Bilateral primary osteoarthritis of knee: Secondary | ICD-10-CM | POA: Diagnosis not present

## 2020-10-24 DIAGNOSIS — M40204 Unspecified kyphosis, thoracic region: Secondary | ICD-10-CM | POA: Diagnosis not present

## 2020-10-24 DIAGNOSIS — M6281 Muscle weakness (generalized): Secondary | ICD-10-CM | POA: Diagnosis not present

## 2020-10-26 DIAGNOSIS — K3580 Unspecified acute appendicitis: Secondary | ICD-10-CM | POA: Diagnosis not present

## 2020-10-26 DIAGNOSIS — M170 Bilateral primary osteoarthritis of knee: Secondary | ICD-10-CM | POA: Diagnosis not present

## 2020-10-26 DIAGNOSIS — M40204 Unspecified kyphosis, thoracic region: Secondary | ICD-10-CM | POA: Diagnosis not present

## 2020-10-26 DIAGNOSIS — M6281 Muscle weakness (generalized): Secondary | ICD-10-CM | POA: Diagnosis not present

## 2020-11-01 DIAGNOSIS — M170 Bilateral primary osteoarthritis of knee: Secondary | ICD-10-CM | POA: Diagnosis not present

## 2020-11-01 DIAGNOSIS — M40204 Unspecified kyphosis, thoracic region: Secondary | ICD-10-CM | POA: Diagnosis not present

## 2020-11-01 DIAGNOSIS — M6281 Muscle weakness (generalized): Secondary | ICD-10-CM | POA: Diagnosis not present

## 2020-11-01 DIAGNOSIS — K3580 Unspecified acute appendicitis: Secondary | ICD-10-CM | POA: Diagnosis not present

## 2020-11-03 DIAGNOSIS — M170 Bilateral primary osteoarthritis of knee: Secondary | ICD-10-CM | POA: Diagnosis not present

## 2020-11-03 DIAGNOSIS — M6281 Muscle weakness (generalized): Secondary | ICD-10-CM | POA: Diagnosis not present

## 2020-11-03 DIAGNOSIS — M40204 Unspecified kyphosis, thoracic region: Secondary | ICD-10-CM | POA: Diagnosis not present

## 2020-11-03 DIAGNOSIS — K3580 Unspecified acute appendicitis: Secondary | ICD-10-CM | POA: Diagnosis not present

## 2020-11-04 ENCOUNTER — Telehealth: Payer: Self-pay

## 2020-11-04 NOTE — Telephone Encounter (Signed)
Noted  

## 2020-11-04 NOTE — Telephone Encounter (Signed)
Patient called she would like to get pre approval process started for a gel injection patient specifically wants to receive the Synvisc one injection call [phone]

## 2020-11-08 ENCOUNTER — Telehealth: Payer: Self-pay

## 2020-11-08 NOTE — Telephone Encounter (Signed)
Talked with patient and her appointment is scheduled for 11/30/2020 with Dr. Marlou Sa

## 2020-11-08 NOTE — Telephone Encounter (Signed)
Pt is requesting to have an appt for the gel injection to be on the 7th or 8th of June

## 2020-11-08 NOTE — Telephone Encounter (Signed)
VOB submitted for SynviscOne, left knee. Pending BV. 

## 2020-11-09 ENCOUNTER — Telehealth: Payer: Self-pay

## 2020-11-09 NOTE — Telephone Encounter (Signed)
Approved for SynviscOne, left knee. [place] Patient is covered at 100% through her insurance. No Co-pay No PA required  Appt. 11/30/2020 with Dr. Marlou Sa

## 2020-11-17 NOTE — Telephone Encounter (Signed)
Error

## 2020-11-30 ENCOUNTER — Encounter: Payer: Self-pay | Admitting: Orthopedic Surgery

## 2020-11-30 ENCOUNTER — Ambulatory Visit: Payer: Self-pay

## 2020-11-30 ENCOUNTER — Other Ambulatory Visit: Payer: Self-pay

## 2020-11-30 ENCOUNTER — Telehealth: Payer: Self-pay

## 2020-11-30 ENCOUNTER — Ambulatory Visit (INDEPENDENT_AMBULATORY_CARE_PROVIDER_SITE_OTHER): Payer: Medicare Other | Admitting: Orthopedic Surgery

## 2020-11-30 DIAGNOSIS — G8929 Other chronic pain: Secondary | ICD-10-CM | POA: Diagnosis not present

## 2020-11-30 DIAGNOSIS — M25562 Pain in left knee: Secondary | ICD-10-CM

## 2020-11-30 DIAGNOSIS — M170 Bilateral primary osteoarthritis of knee: Secondary | ICD-10-CM

## 2020-11-30 DIAGNOSIS — M1712 Unilateral primary osteoarthritis, left knee: Secondary | ICD-10-CM

## 2020-11-30 DIAGNOSIS — M25561 Pain in right knee: Secondary | ICD-10-CM

## 2020-11-30 NOTE — Telephone Encounter (Signed)
Can we please get approval for right knee gel injection?

## 2020-11-30 NOTE — Progress Notes (Signed)
Office Visit Note   Patient: Marie Hebert           Date of Birth: February 23, 1933           MRN: [id number] Visit Date: 11/30/2020 Requested by: Colon Branch, [address] [place],  [place] [address] PCP: Colon Branch, MD  Subjective: Chief Complaint  Patient presents with  . Right Knee - Pain  . Left Knee - Pain    HPI: Marie Hebert is a 87 y.o. female who presents to the office complaining of bilateral knee pain.  She is here today for left knee Synvisc 1 injection.  She has history of knee arthritis in both knees with worsening pain that has impacted her walking endurance particularly in the last 6 months.  Both knees are having increasing pain in the left knee has increased stiffness.  No recent injury or events leading to increase in knee pain, pain is just been gradual.  Denies any groin pain, thigh pain, radicular pain.  No history of prior left knee surgery but patient does report prior left knee meniscus tear that was treated with laser surgery.  She has never had any knee injections in the past.  She did go to [facility] but received no injections from them..                ROS: All systems reviewed are negative as they relate to the chief complaint within the history of present illness.  Patient denies fevers or chills.  Assessment & Plan: Visit Diagnoses:  1. Chronic pain of both knees     Plan: Patient is a 87 year old female who presents for evaluation of bilateral knee pain.  She has history of bilateral knee osteoarthritis with radiographs taken today that demonstrate mild to moderate degenerative changes throughout.  She has never had knee injections and she would like to try Synvisc injections.  She knows other people who have had good relief with gel injections in the past.  She is approved for the left knee Synvisc injection and offered to inject the right knee with cortisone today but she declined and states she wants to see how the left knee  does.  Left knee Synvisc 1 injection was administered and patient tolerated the procedure well.  Plan to preapproved patient for right knee gel injection and she may opt for this in the future if she feels the left knee benefited from the gel injection.  Plan for her to follow-up as needed if she would like the right knee injected.  Follow-Up Instructions: No follow-ups on file.   Orders:  Orders Placed This Encounter  Procedures  . XR Knee 1-2 Views Right  . XR Knee 1-2 Views Left   No orders of the defined types were placed in this encounter.     Procedures: Large Joint Inj: L knee on 11/30/2020 12:26 PM Indications: diagnostic evaluation, joint swelling and pain Details: 18 G 1.5 in needle, superolateral approach  Arthrogram: No  Medications: 5 mL lidocaine 1 %; 48 mg Hylan 48 MG/6ML Outcome: tolerated well, no immediate complications Procedure, treatment alternatives, risks and benefits explained, specific risks discussed. Consent was given by the patient. Immediately prior to procedure a time out was called to verify the correct patient, procedure, equipment, support staff and site/side marked as required. Patient was prepped and draped in the usual sterile fashion.       Clinical Data: No additional findings.  Objective: Vital Signs:  There were no vitals taken for this visit.  Physical Exam:  Constitutional: Patient appears well-developed HEENT:  Head: Normocephalic Eyes:EOM are normal Neck: Normal range of motion Cardiovascular: Normal rate Pulmonary/chest: Effort normal Neurologic: Patient is alert Skin: Skin is warm Psychiatric: Patient has normal mood and affect  Ortho Exam: Ortho exam demonstrates bilateral knees with 3 degree flexion contracture and 110 degrees of knee flexion.  Medial lateral joint line tenderness over the left knee.  Medial joint line tenderness over the right knee.  Able to perform straight leg raise without extensor lag in both knees.  No  pain with hip range of motion bilaterally.  No knee effusion bilaterally.  No calf tenderness bilaterally.  Specialty Comments:  No specialty comments available.  Imaging: No results found.   PMFS History: Patient Active Problem List   Diagnosis Date Noted  . Status post surgery 09/25/2020  . Acute appendicitis 09/25/2020  . Essential hypertension 07/12/2017  . PAC (premature atrial contraction) 07/12/2017  . Mitral regurgitation 07/12/2017  . Chest discomfort 06/13/2017  . Onychomycosis 08/10/2015  . Pain in lower limb 08/10/2015  . PCP NOTES>>> 04/06/2015  . Skin lesion 03/18/2013  . Annual physical exam 12/31/2011  . DJD (degenerative joint disease) 12/31/2011  . Vitamin D deficiency 12/31/2011  . Recurrent UTI 10/10/2011  . PALPITATIONS, OCCASIONAL 10/14/2007  . BACK PAIN 09/16/2007   Past Medical History:  Diagnosis Date  . Back pain   . Dizziness   . Hypertension   . Palpitations     Family History  Problem Relation Age of Onset  . Cancer Father   . Stomach cancer Father        h pylori  . Diabetes Son   . CAD Neg Hx   . Colon cancer Neg Hx   . Breast cancer Neg Hx     Past Surgical History:  Procedure Laterality Date  . CATARACT EXTRACTION  2010   right  . COLONOSCOPY  2003  . LAPAROSCOPIC APPENDECTOMY N/A 09/25/2020   Procedure: APPENDECTOMY LAPAROSCOPIC;  Surgeon: Kinsinger, Arta Bruce, MD;  Location: [facility];  Service: General;  Laterality: N/A;  . RETINAL LASER PROCEDURE     Social History   Occupational History  . Occupation: n/a  Tobacco Use  . Smoking status: Never Smoker  . Smokeless tobacco: Never Used  Vaping Use  . Vaping Use: Never used  Substance and Sexual Activity  . Alcohol use: No    Alcohol/week: 0.0 standard drinks  . Drug use: No  . Sexual activity: Not on file

## 2020-11-30 NOTE — Telephone Encounter (Signed)
Noted  

## 2020-12-02 ENCOUNTER — Encounter: Payer: Self-pay | Admitting: Orthopedic Surgery

## 2020-12-02 MED ORDER — HYLAN G-F 20 48 MG/6ML IX SOSY
48.0000 mg | PREFILLED_SYRINGE | INTRA_ARTICULAR | Status: AC | PRN
  Administered 2020-11-30: 48 mg via INTRA_ARTICULAR

## 2020-12-02 MED ORDER — LIDOCAINE HCL 1 % IJ SOLN
5.0000 mL | INTRAMUSCULAR | Status: AC | PRN
  Administered 2020-11-30: 5 mL

## 2020-12-27 ENCOUNTER — Telehealth: Payer: Self-pay

## 2020-12-27 NOTE — Telephone Encounter (Signed)
Patient Name: Marie Hebert Gender: Female DOB: 12-24-1932 Client [facility] Night - Client Client Site [facility] - Night Physician Kathlene November - MD Contact Type Call Who Is Calling Patient / Member / Family / Caregiver Call Type Triage / Clinical Caller Name Tamra Kerins Relationship To Patient Self Return Phone Number [phone] (Primary) Chief Complaint Cough Reason for Call Symptomatic / Request for [facility] states she may have bronchitis. She has a cough with phlegm . She is requesting an antibiotic Translation No Nurse Assessment Nurse: Lynnette Caffey, RN, Museum/gallery conservator Date/Time (Eastern Time): 12/25/2020 8:16:31 PM Confirm and document reason for call. If symptomatic, describe symptoms. ---Caller states she may have bronchitis. She has a cough with phlegm. Sx started last night at midnight. States phlegm is yellow. Denies fever. Does the patient have any new or worsening symptoms? ---Yes Will a triage be completed? ---Yes Related visit to physician within the last 2 weeks? ---No Does the PT have any chronic conditions? (i.e. diabetes, asthma, this includes High risk factors for pregnancy, etc.) ---No Is this a behavioral health or substance abuse call? ---No Guidelines Guideline Title Affirmed Question Affirmed Notes Nurse Date/Time Eilene Ghazi Time) Cough - Acute Productive Cough Morris, RN, [employer] 12/25/2020 8:18:08 PM Disp. Time Eilene Ghazi Time) Disposition Final User 12/25/2020 8:26:48 PM Home Care Yes Morris, RN, Agricultural consultant Disagree/Comply Comply PLEASE NOTE: All timestamps contained within this report are represented as [place] Standard Time.

## 2020-12-27 NOTE — Telephone Encounter (Signed)
Appt scheduled 12/28/20.

## 2020-12-28 ENCOUNTER — Other Ambulatory Visit: Payer: Self-pay

## 2020-12-28 ENCOUNTER — Ambulatory Visit (INDEPENDENT_AMBULATORY_CARE_PROVIDER_SITE_OTHER): Payer: Medicare Other | Admitting: Internal Medicine

## 2020-12-28 ENCOUNTER — Encounter: Payer: Self-pay | Admitting: Internal Medicine

## 2020-12-28 VITALS — BP 126/84 | HR 88 | Temp 97.9°F | Resp 18 | Ht 60.0 in | Wt 174.5 lb

## 2020-12-28 DIAGNOSIS — U071 COVID-19: Secondary | ICD-10-CM | POA: Diagnosis not present

## 2020-12-28 NOTE — Progress Notes (Signed)
Of her  Subjective:    Patient ID: Marie Hebert, female    DOB: 05-18-1933, 87 y.o.   MRN: [id number]  DOS:  12/28/2020 Type of visit - description: acute  Symptoms a started June 21: Developed cough and a low-grade fever, T-max 98.8. At that time, she tested + for COVID. Shortly after she become asymptomatic but wonders now  if she   needs a chest x-ray.  At this point she denies fever chills No cough or chest congestion No chest pain No difficulty breathing No nausea, vomiting, diarrhea.  Review of Systems See above   Past Medical History:  Diagnosis Date   Back pain    Dizziness    Hypertension    Palpitations     Past Surgical History:  Procedure Laterality Date   CATARACT EXTRACTION  2010   right   COLONOSCOPY  2003   LAPAROSCOPIC APPENDECTOMY N/A 09/25/2020   Procedure: APPENDECTOMY LAPAROSCOPIC;  Surgeon: Kinsinger, Arta Bruce, MD;  Location: [facility];  Service: General;  Laterality: N/A;   RETINAL LASER PROCEDURE      Allergies as of 12/28/2020       Reactions   Aspirin    Ibuprofen Other (See Comments)        Medication List        Accurate as of December 28, 2020 11:27 AM. If you have any questions, ask your nurse or doctor.          acetaminophen 325 MG tablet Commonly known as: TYLENOL Take 2 tablets (650 mg total) by mouth every 6 (six) hours as needed for mild pain (or temp > 100).   diltiazem 120 MG 24 hr capsule Commonly known as: CARDIZEM CD Take 1 capsule (120 mg total) by mouth daily.   Fish Oil 1000 MG Caps Take 1 capsule by mouth daily.   Magnesium 500 MG Caps Take 1 capsule by mouth daily.   oxyCODONE 5 MG immediate release tablet Commonly known as: Oxy IR/ROXICODONE Take 1 tablet (5 mg total) by mouth every 6 (six) hours as needed for moderate pain.   PRESERVISION AREDS 2 PO Take by mouth.   ONE-A-DAY WOMENS 50+ PO Take 1 tablet by mouth daily.   vitamin C 500 MG tablet Commonly known as: ASCORBIC ACID Take 500 mg by  mouth daily.   Vitamin D 125 MCG (5000 UT) Caps Take 1 capsule by mouth daily.           Objective:   Physical Exam BP 126/84 (BP Location: Left Arm, Patient Position: Sitting, Cuff Size: Small)   Pulse 88   Temp 97.9 F (36.6 C) (Oral)   Resp 18   Ht 5' (1.524 m)   Wt 174 lb 8 oz (79.2 kg)   SpO2 97%   BMI 34.08 kg/m  General:   Well developed, NAD, BMI noted. HEENT:  Normocephalic . Face symmetric, atraumatic. Throat: Symmetric, no breath Lungs:  CTA B Normal respiratory effort, no intercostal retractions, no accessory muscle use. Heart: RRR,  no murmur.  Lower extremities: no pretibial edema bilaterally  Skin: Not pale. Not jaundice Neurologic:  alert & oriented X3.  Speech normal, gait appropriate for age and unassisted Psych--  Cognition and judgment appear intact.  Cooperative with normal attention span and concentration.  Behavior appropriate. No anxious or depressed appearing.      Assessment    Assessment  HTN (h/o palpitations, on cardiazem) DJD Recurrent UTIs; extensive urology w/u  2015:atrophic vaginitis, eventually decided to try hormonal  creams Back pain, chronic, needs a disability parking  Palpitations: Saw cardiology 05-2017: Holter, echo, stress echo essentially negative Osteopenia: T score -1.5 (02/2016) COVID-19, DX 11/2020  PLAN: COVID-19: The patient is 12, unvaccinated, was Dx with COVID-19 recently, she had a very mild infection, she is currently asymptomatic. At this point I do not think x-ray or further evaluation is needed as long as she continues to feel well. She felt relief when learned she did not need any more testing. RTC 06-2021 for CPX      This visit occurred during the SARS-CoV-2 public health emergency.  Safety protocols were in place, including screening questions prior to the visit, additional usage of staff PPE, and extensive cleaning of exam room while observing appropriate contact time as indicated for  disinfecting solutions.

## 2020-12-29 NOTE — Assessment & Plan Note (Signed)
COVID-19: The patient is 13, unvaccinated, was Dx with COVID-19 recently, she had a very mild infection, she is currently asymptomatic. At this point I do not think x-ray or further evaluation is needed as long as she continues to feel well. She felt relief when learned she did not need any more testing. RTC 06-2021 for CPX

## 2020-12-30 ENCOUNTER — Telehealth: Payer: Self-pay

## 2020-12-30 NOTE — Telephone Encounter (Signed)
VOB has been submitted for SynviscOne, right knee. Pending BV.

## 2021-01-02 ENCOUNTER — Telehealth: Payer: Self-pay

## 2021-01-02 NOTE — Telephone Encounter (Signed)
Called and left a VM for patient to CB to schedule an appt.with Dr. Marlou Sa for gel injection.  Approved for SynviscOne, right knee [place] Patient will be responsible for 20% OOP. No Co-pay No PA required.

## 2021-01-02 NOTE — Telephone Encounter (Signed)
Error

## 2021-01-09 DIAGNOSIS — H43813 Vitreous degeneration, bilateral: Secondary | ICD-10-CM | POA: Diagnosis not present

## 2021-01-09 DIAGNOSIS — H35033 Hypertensive retinopathy, bilateral: Secondary | ICD-10-CM | POA: Diagnosis not present

## 2021-01-09 DIAGNOSIS — H04123 Dry eye syndrome of bilateral lacrimal glands: Secondary | ICD-10-CM | POA: Diagnosis not present

## 2021-01-09 DIAGNOSIS — H353131 Nonexudative age-related macular degeneration, bilateral, early dry stage: Secondary | ICD-10-CM | POA: Diagnosis not present

## 2021-01-20 ENCOUNTER — Other Ambulatory Visit: Payer: Self-pay

## 2021-01-20 ENCOUNTER — Telehealth: Payer: Self-pay

## 2021-01-20 ENCOUNTER — Encounter: Payer: Self-pay | Admitting: Orthopedic Surgery

## 2021-01-20 ENCOUNTER — Ambulatory Visit (INDEPENDENT_AMBULATORY_CARE_PROVIDER_SITE_OTHER): Payer: Medicare Other | Admitting: Orthopedic Surgery

## 2021-01-20 DIAGNOSIS — G8929 Other chronic pain: Secondary | ICD-10-CM

## 2021-01-20 DIAGNOSIS — M1712 Unilateral primary osteoarthritis, left knee: Secondary | ICD-10-CM

## 2021-01-20 DIAGNOSIS — M25562 Pain in left knee: Secondary | ICD-10-CM

## 2021-01-20 DIAGNOSIS — M1711 Unilateral primary osteoarthritis, right knee: Secondary | ICD-10-CM

## 2021-01-20 DIAGNOSIS — M25561 Pain in right knee: Secondary | ICD-10-CM

## 2021-01-20 MED ORDER — LIDOCAINE HCL 1 % IJ SOLN
5.0000 mL | INTRAMUSCULAR | Status: AC | PRN
  Administered 2021-01-20: 5 mL

## 2021-01-20 MED ORDER — HYLAN G-F 20 48 MG/6ML IX SOSY
48.0000 mg | PREFILLED_SYRINGE | INTRA_ARTICULAR | Status: AC | PRN
Start: 2021-01-20 — End: 2021-01-20
  Administered 2021-01-20: 48 mg via INTRA_ARTICULAR

## 2021-01-20 NOTE — Progress Notes (Signed)
   Procedure Note  Patient: Marie Hebert             Date of Birth: 1932-07-22           MRN: [id number]             Visit Date: 01/20/2021  Procedures: Visit Diagnoses:  1. Unilateral primary osteoarthritis, left knee   2. Chronic pain of both knees   3. Arthritis of right knee     Large Joint Inj: R knee on 01/20/2021 9:19 AM Indications: pain, joint swelling and diagnostic evaluation Details: 18 G 1.5 in needle, superolateral approach  Arthrogram: No  Medications: 5 mL lidocaine 1 %; 48 mg Hylan 48 MG/6ML Outcome: tolerated well, no immediate complications Procedure, treatment alternatives, risks and benefits explained, specific risks discussed. Consent was given by the patient. Immediately prior to procedure a time out was called to verify the correct patient, procedure, equipment, support staff and site/side marked as required. Patient was prepped and draped in the usual sterile fashion.   This patient is diagnosed with osteoarthritis of the knee(s).    Radiographs show evidence of joint space narrowing, osteophytes, subchondral sclerosis and/or subchondral cysts.  This patient has knee pain which interferes with functional and activities of daily living.    This patient has experienced inadequate response, adverse effects and/or intolerance with conservative treatments such as acetaminophen, NSAIDS, topical creams, physical therapy or regular exercise, knee bracing and/or weight loss.   This patient has experienced inadequate response or has a contraindication to intra articular steroid injections for at least 3 months.   This patient is not scheduled to have a total knee replacement within 6 months of starting treatment with viscosupplementation.

## 2021-01-20 NOTE — Telephone Encounter (Signed)
Can we please get patient approved for repeat right knee gel injection in 6 months.

## 2021-01-20 NOTE — Telephone Encounter (Signed)
Noted  

## 2021-01-26 ENCOUNTER — Telehealth: Payer: Self-pay | Admitting: Internal Medicine

## 2021-01-26 NOTE — Telephone Encounter (Signed)
Pt's son dropped off application for disability license plate. Son states to please call him at [phone] when form is ready for pick up. Document placed in Dr.Paz tray at front office

## 2021-01-26 NOTE — Telephone Encounter (Signed)
Form completed, placed in PCP red folder to be signed.  

## 2021-01-30 NOTE — Telephone Encounter (Signed)
Spoke w/ Kal- informed form at front desk for pick-up. Copy sent for scanning.

## 2021-02-07 ENCOUNTER — Telehealth: Payer: Self-pay | Admitting: Orthopedic Surgery

## 2021-02-07 NOTE — Telephone Encounter (Signed)
Tylenol, occasional NSAIDs if she is allowed by her primary doctor to take anti-inflammatories.  Other options would be topical voltaren gel, CBD oil, ice/heat.  Too soon for repeat knee injection but could consider that again in September at earliest

## 2021-02-07 NOTE — Telephone Encounter (Signed)
Patient called. She would like to know what she can do for pain. [place]. Says she is in a lot of pain in her knee. Would like  a call. [phone]

## 2021-02-08 NOTE — Telephone Encounter (Signed)
Attempted to contact patient however had to leave a voicemail. Informed patient of recommendations that was provided by [facility]- PA-C. If she has any further questions or concerns she is able to contact our office back.

## 2021-03-13 ENCOUNTER — Encounter: Payer: Self-pay | Admitting: Orthopedic Surgery

## 2021-03-13 ENCOUNTER — Other Ambulatory Visit: Payer: Self-pay

## 2021-03-13 ENCOUNTER — Ambulatory Visit (INDEPENDENT_AMBULATORY_CARE_PROVIDER_SITE_OTHER): Payer: Medicare Other | Admitting: Orthopedic Surgery

## 2021-03-13 DIAGNOSIS — M1712 Unilateral primary osteoarthritis, left knee: Secondary | ICD-10-CM

## 2021-03-13 DIAGNOSIS — M1711 Unilateral primary osteoarthritis, right knee: Secondary | ICD-10-CM | POA: Diagnosis not present

## 2021-03-15 ENCOUNTER — Encounter: Payer: Self-pay | Admitting: Orthopedic Surgery

## 2021-03-15 NOTE — Progress Notes (Signed)
Office Visit Note   Patient: Marie Hebert           Date of Birth: 1932/07/13           MRN: [id number] Visit Date: 03/13/2021 Requested by: Colon Branch, [address] [place],  [place] [address] PCP: Colon Branch, MD  Subjective: Chief Complaint  Patient presents with   Right Knee - Follow-up    HPI: Marie Hebert is an 87 year old patient with bilateral knee arthritis right worse than left.  Had gel injections on 01/20/2021.  Reports sharp pains in the knee while sleeping.  Does not take any medication for the pain.  Did not really get much relief after the injection in the right knee.  She has had no falls or injuries since the last office visit.  Left knee gel injection did help but the right knee did not.  Patient does not really want pain medication or physical therapy.  She does not want surgery.  She is here with her daughter today.              ROS: All systems reviewed are negative as they relate to the chief complaint within the history of present illness.  Patient denies  fevers or chills.   Assessment & Plan: Visit Diagnoses:  1. Unilateral primary osteoarthritis, left knee   2. Arthritis of right knee     Plan: Impression is right knee pain with arthritis.  All radiographs are reviewed and she does have tricompartmental arthritic change worse on the medial side.  No effusion today and her range of motion is actually pretty reasonable.  All in all I think for Melissa her best option within her stated restrictions would be quad strengthening exercises.  In general and that could offload the cushion requirements that her otherwise deficient cartilage is having to provide.  Discussed with her home options which would primarily be 10 pound ankle weight and leg extension exercises along with stationary bike.  She is going to try that for a few months and I think that her pain could be improved marginally with a dedicated quad strengthening program.  Follow-up as  needed.  Follow-Up Instructions: Return if symptoms worsen or fail to improve.   Orders:  No orders of the defined types were placed in this encounter.  No orders of the defined types were placed in this encounter.     Procedures: No procedures performed   Clinical Data: No additional findings.  Objective: Vital Signs: There were no vitals taken for this visit.  Physical Exam:   Constitutional: Patient appears well-developed HEENT:  Head: Normocephalic Eyes:EOM are normal Neck: Normal range of motion Cardiovascular: Normal rate Pulmonary/chest: Effort normal Neurologic: Patient is alert Skin: Skin is warm Psychiatric: Patient has normal mood and affect   Ortho Exam: Ortho exam demonstrates no pain within internal rotation or external rotation of either leg.  No effusion in either knee.  Extensor mechanism intact bilaterally.  Collaterals are stable to varus valgus stress bilaterally.  Pedal pulses palpable.  No masses lymphadenopathy or skin changes noted in that right knee region.  Specifically no Baker's cyst.  Has mild medial greater than lateral joint line tenderness and very mild patellofemoral crepitus bilaterally.  Specialty Comments:  No specialty comments available.  Imaging: No results found.   PMFS History: Patient Active Problem List   Diagnosis Date Noted   Status post surgery 09/25/2020   Acute appendicitis 09/25/2020   Essential hypertension  07/12/2017   PAC (premature atrial contraction) 07/12/2017   Mitral regurgitation 07/12/2017   Chest discomfort 06/13/2017   Onychomycosis 08/10/2015   Pain in lower limb 08/10/2015   PCP NOTES>>> 04/06/2015   Skin lesion 03/18/2013   Annual physical exam 12/31/2011   DJD (degenerative joint disease) 12/31/2011   Vitamin D deficiency 12/31/2011   Recurrent UTI 10/10/2011   PALPITATIONS, OCCASIONAL 10/14/2007   BACK PAIN 09/16/2007   Past Medical History:  Diagnosis Date   Back pain    Dizziness     Hypertension    Palpitations     Family History  Problem Relation Age of Onset   Cancer Father    Stomach cancer Father        h pylori   Diabetes Son    CAD Neg Hx    Colon cancer Neg Hx    Breast cancer Neg Hx     Past Surgical History:  Procedure Laterality Date   CATARACT EXTRACTION  2010   right   COLONOSCOPY  2003   LAPAROSCOPIC APPENDECTOMY N/A 09/25/2020   Procedure: APPENDECTOMY LAPAROSCOPIC;  Surgeon: Kinsinger, Arta Bruce, MD;  Location: [facility];  Service: General;  Laterality: N/A;   RETINAL LASER PROCEDURE     Social History   Occupational History   Occupation: n/a  Tobacco Use   Smoking status: Never   Smokeless tobacco: Never  Vaping Use   Vaping Use: Never used  Substance and Sexual Activity   Alcohol use: No    Alcohol/week: 0.0 standard drinks   Drug use: No   Sexual activity: Not on file

## 2021-07-07 ENCOUNTER — Telehealth: Payer: Self-pay

## 2021-07-07 NOTE — Telephone Encounter (Signed)
VOB submitted for SynviscOne, right knee. BV pending.  

## 2021-07-10 ENCOUNTER — Ambulatory Visit (INDEPENDENT_AMBULATORY_CARE_PROVIDER_SITE_OTHER): Payer: Medicare Other | Admitting: Internal Medicine

## 2021-07-10 ENCOUNTER — Encounter: Payer: Self-pay | Admitting: Internal Medicine

## 2021-07-10 VITALS — BP 126/82 | HR 77 | Temp 98.2°F | Resp 16 | Ht 60.0 in | Wt 176.1 lb

## 2021-07-10 DIAGNOSIS — Z0000 Encounter for general adult medical examination without abnormal findings: Secondary | ICD-10-CM

## 2021-07-10 LAB — BASIC METABOLIC PANEL
BUN: 15 mg/dL (ref 6–23)
CO2: 27 mEq/L (ref 19–32)
Calcium: 9.3 mg/dL (ref 8.4–10.5)
Chloride: 104 mEq/L (ref 96–112)
Creatinine, Ser: 0.56 mg/dL (ref 0.40–1.20)
GFR: 82.57 mL/min (ref >60.00)
Glucose, Bld: 102 mg/dL — ABNORMAL HIGH (ref 70–99)
Potassium: 4.9 mEq/L (ref 3.5–5.1)
Sodium: 138 mEq/L (ref 135–145)

## 2021-07-10 LAB — HEMOGLOBIN A1C: Hgb A1c MFr Bld: 5.5 % (ref 4.6–6.5)

## 2021-07-10 NOTE — Assessment & Plan Note (Signed)
Td 2009; consistently declines any shot or screening procedures. Labs reviewed, will get a BMP and A1c (states she will not take cholesterol medication, will not check FLP). Lifestyle remains very good.  ACP package of information

## 2021-07-10 NOTE — Progress Notes (Signed)
Subjective:    Patient ID: Marie Hebert, female    DOB: 1932-10-14, 88 y.o.   MRN: [id number]  DOS:  07/10/2021 Type of visit - description: CPX  Since the last office visit is doing well Has knee pain, sees Ortho.  Other than that she feels well physically and emotionally   Review of Systems  Other than above, a 14 point review of systems is negative       Past Medical History:  Diagnosis Date   Back pain    Dizziness    Hypertension    Palpitations     Past Surgical History:  Procedure Laterality Date   CATARACT EXTRACTION  2010   right   COLONOSCOPY  2003   LAPAROSCOPIC APPENDECTOMY N/A 09/25/2020   Procedure: APPENDECTOMY LAPAROSCOPIC;  Surgeon: Kinsinger, Arta Bruce, MD;  Location: [facility];  Service: General;  Laterality: N/A;   RETINAL LASER PROCEDURE     Social History   Socioeconomic History   Marital status: Widowed    Spouse name: Not on file   Number of children: 4   Years of education: Not on file   Highest education level: Not on file  Occupational History   Occupation: n/a  Tobacco Use   Smoking status: Never   Smokeless tobacco: Never  Vaping Use   Vaping Use: Never used  Substance and Sexual Activity   Alcohol use: No    Alcohol/week: 0.0 standard drinks   Drug use: Not on file   Sexual activity: Not on file  Other Topics Concern   Not on file  Social History Narrative   Widower   Still drives   Lives by herself     Social Determinants of Health   Financial Resource Strain: Not on file  Food Insecurity: Not on file  Transportation Needs: Not on file  Physical Activity: Not on file  Stress: Not on file  Social Connections: Not on file  Intimate Partner Violence: Not on file    Current Outpatient Medications  Medication Instructions   Cholecalciferol (VITAMIN D) 125 MCG (5000 UT) CAPS 1 capsule, Oral, Daily   diltiazem (CARDIZEM CD) 120 mg, Oral, Daily   Magnesium 500 MG CAPS 1 capsule, Oral, Daily,     Multiple  Vitamins-Minerals (ONE-A-DAY WOMENS 50+ PO) 1 tablet, Oral, Daily   Multiple Vitamins-Minerals (PRESERVISION AREDS 2 PO) Oral   Omega-3 Fatty Acids (FISH OIL) 1000 MG CAPS 1 capsule, Oral, Daily,     vitamin C (ASCORBIC ACID) 500 mg, Daily       Objective:   Physical Exam BP 126/82 (BP Location: Right Arm, Patient Position: Sitting, Cuff Size: Small)    Pulse 77    Temp 98.2 F (36.8 C) (Oral)    Resp 16    Ht 5' (1.524 m)    Wt 176 lb 2 oz (79.9 kg)    SpO2 98%    BMI 34.40 kg/m  General: Well developed, NAD, BMI noted Neck: No  thyromegaly  HEENT:  Normocephalic . Face symmetric, atraumatic Lungs:  CTA B Normal respiratory effort, no intercostal retractions, no accessory muscle use. Heart: RRR,  no murmur.  Abdomen:  Not distended, soft, non-tender. No rebound or rigidity.   Lower extremities: Knee bowing noted. Skin: Exposed areas without rash. Not pale. Not jaundice Neurologic:  alert & oriented X3.  Speech normal, gait appropriate for age and unassisted.  Transferring somewhat limited by DJD Strength symmetric and appropriate for age.  Psych: Cognition and judgment appear intact.  Cooperative with normal attention span and concentration.  Behavior appropriate. No anxious or depressed appearing.     Assessment     Assessment  HTN (h/o palpitations, on cardiazem) DJD Recurrent UTIs; extensive urology w/u  2015:atrophic vaginitis, eventually decided to try hormonal creams Back pain, chronic, needs a disability parking  Palpitations: Saw cardiology 05-2017: Holter, echo, stress echo essentially negative Osteopenia: T score -1.5 (02/2016) Macular degeneration COVID-19, DX 11/2020  PLAN: Here for CPX DJD: Follow-up with Ortho regards left knee pain. Osteopenia: Declines further DEXAs  or treatment.  On calcium - vitamin D. RTC 1 year   This visit occurred during the SARS-CoV-2 public health emergency.  Safety protocols were in place, including screening questions  prior to the visit, additional usage of staff PPE, and extensive cleaning of exam room while observing appropriate contact time as indicated for disinfecting solutions.

## 2021-07-10 NOTE — Assessment & Plan Note (Signed)
Here for CPX DJD: Follow-up with Ortho regards left knee pain. Osteopenia: Declines further DEXAs  or treatment.  On calcium - vitamin D. RTC 1 year

## 2021-07-10 NOTE — Patient Instructions (Signed)
Please read the information about advance care planning.   GO TO THE LAB : Get the blood work     Marie Hebert, Marie Hebert Come back for physical exam in 1 year

## 2021-07-11 ENCOUNTER — Telehealth: Payer: Self-pay

## 2021-07-11 NOTE — Telephone Encounter (Signed)
Called and left a VM for patient to CB to schedule for gel injection with Dr. Marlou Sa.  Approved for SynviscOne, right knee. Buy & Bill Must meet deductible first Patient will be responsible for 20% OOP. No Co-pay No PA required

## 2021-07-16 ENCOUNTER — Other Ambulatory Visit: Payer: Self-pay | Admitting: Internal Medicine

## 2021-07-21 ENCOUNTER — Telehealth: Payer: Self-pay | Admitting: Internal Medicine

## 2021-07-21 NOTE — Telephone Encounter (Signed)
Pt would like lab results 1/16 mailed to her home. Please advise.

## 2021-07-21 NOTE — Telephone Encounter (Signed)
Results mailed 

## 2021-08-01 DIAGNOSIS — M1711 Unilateral primary osteoarthritis, right knee: Secondary | ICD-10-CM | POA: Diagnosis not present

## 2021-08-01 DIAGNOSIS — M25561 Pain in right knee: Secondary | ICD-10-CM | POA: Diagnosis not present

## 2021-08-08 DIAGNOSIS — M1711 Unilateral primary osteoarthritis, right knee: Secondary | ICD-10-CM | POA: Diagnosis not present

## 2021-08-08 DIAGNOSIS — M545 Low back pain, unspecified: Secondary | ICD-10-CM | POA: Diagnosis not present

## 2021-09-18 DIAGNOSIS — M5416 Radiculopathy, lumbar region: Secondary | ICD-10-CM | POA: Diagnosis not present

## 2021-09-18 DIAGNOSIS — M5459 Other low back pain: Secondary | ICD-10-CM | POA: Diagnosis not present

## 2021-10-02 DIAGNOSIS — M5459 Other low back pain: Secondary | ICD-10-CM | POA: Diagnosis not present

## 2021-10-13 ENCOUNTER — Ambulatory Visit (INDEPENDENT_AMBULATORY_CARE_PROVIDER_SITE_OTHER): Payer: Medicare Other | Admitting: Internal Medicine

## 2021-10-13 ENCOUNTER — Encounter: Payer: Self-pay | Admitting: Internal Medicine

## 2021-10-13 VITALS — BP 126/80 | HR 79 | Temp 97.7°F | Resp 16 | Ht 60.0 in | Wt 175.2 lb

## 2021-10-13 DIAGNOSIS — M159 Polyosteoarthritis, unspecified: Secondary | ICD-10-CM

## 2021-10-13 MED ORDER — CYCLOBENZAPRINE HCL 10 MG PO TABS: 10.0000 mg | ORAL_TABLET | Freq: Every evening | ORAL | 0 refills | Status: DC | PRN

## 2021-10-13 NOTE — Progress Notes (Signed)
? ?Subjective:  ? ? Patient ID: Marie Hebert, female    DOB: [birth year], 88 y.o.   MRN: [id number] ? ?DOS:  10/13/2021 ?Type of visit - description: Acute ? ?The patient is somewhat frustrated about pain. ?Continue with knee and shoulder pain. ?Her main concern however continues to be low back pain. ?Saw Dr. Nelva Bush few weeks ago, spinal stenosis?.  Was prescribed prednisone, did not help. ?Had an MRI, results unknown to me. ? ?The pain is in the low back, it varies in intensity from 4 to 9/10. ?No clear-cut radiation to the lower extremities. ?Actually decreased with walking, no claudication. ?Pain increases at night when she lays down and she also have some nocturnal leg cramps. ?She did admit to episodic paresthesias at the toes. ? ? ? ?Review of Systems ?See above  ? ?Past Medical History:  ?Diagnosis Date  ? Back pain   ? Dizziness   ? Hypertension   ? Palpitations   ? ? ?Past Surgical History:  ?Procedure Laterality Date  ? CATARACT EXTRACTION  2010  ? right  ? COLONOSCOPY  2003  ? LAPAROSCOPIC APPENDECTOMY N/A 09/25/2020  ? Procedure: APPENDECTOMY LAPAROSCOPIC;  Surgeon: Kinsinger, Arta Bruce, MD;  Location: [facility];  Service: General;  Laterality: N/A;  ? RETINAL LASER PROCEDURE    ? ? ?Current Outpatient Medications  ?Medication Instructions  ? Cholecalciferol (VITAMIN D) 125 MCG (5000 UT) CAPS 1 capsule, Oral, Daily  ? diltiazem (CARDIZEM CD) 120 MG 24 hr capsule TAKE 1 CAPSULE BY MOUTH EVERY DAY  ? Magnesium 500 MG CAPS 1 capsule, Oral, Daily,    ? Multiple Vitamins-Minerals (ONE-A-DAY WOMENS 50+ PO) 1 tablet, Oral, Daily  ? Multiple Vitamins-Minerals (PRESERVISION AREDS 2 PO) Oral  ? Omega-3 Fatty Acids (FISH OIL) 1000 MG CAPS 1 capsule, Oral, Daily,    ? vitamin C (ASCORBIC ACID) 500 mg, Daily  ? ? ?   ?Objective:  ? Physical Exam ?BP 126/80 (BP Location: Left Arm, Patient Position: Sitting, Cuff Size: Small)   Pulse 79   Temp 97.7 ?F (36.5 ?C) (Oral)   Resp 16   Ht 5' (1.524 m)   Wt 175 lb 4 oz  (79.5 kg)   SpO2 98%   BMI 34.23 kg/m?  ?General:   ?Well developed, NAD, BMI noted. ?HEENT:  ?Normocephalic . Face symmetric, atraumatic ?MSK: ?Slightly TTP at the low back. ?Lower extremities: no pretibial edema bilaterally  ?Skin: Not pale. Not jaundice ?Neurologic:  ?alert & oriented X3.  ?Speech normal, gait and transferring slightly antalgic ?Motor symmetric ?DTR symmetric, R ankle jerk slightly decreased. ?Straight leg test negative ?Psych--  ?Cognition and judgment appear intact.  ?Cooperative with normal attention span and concentration.  ?Behavior appropriate. ?No anxious or depressed appearing.  ? ?   ?Assessment   ? ?  ? Assessment  ?HTN (h/o palpitations, on cardiazem) ?DJD ?Recurrent UTIs; extensive urology w/u  2015:atrophic vaginitis, eventually decided to try hormonal creams ?Back pain, chronic, needs a disability parking  ?Palpitations: Saw cardiology 05-2017: Holter, echo, stress echo essentially negative ?Osteopenia: T score -1.5 (02/2016) ?Macular degeneration ?COVID-19, DX 11/2020 ? ?PLAN: ?DJD: ?Has pain at the knees, shoulders and back. ?Her main concern today is back pain, she is somewhat frustrated by the pain however she is not taking any medication consistently. ?Saw Dr. Nelva Bush, he suspected spinal stenosis, MRI was done, results unknown to me or the pt. ?From my standpoint recommend the following: ?Stay active, judicious use of Tylenol, no NSAIDs, Flexeril nightly as needed  leg cramps, watch for somnolence.  Encouraged to keep an appointment with Dr. Nelva Bush 10/25/2021 understanding that if this is DJD there may not be treatment to "cure" her back pain. ?She verbalized understanding. ?  ? ?This visit occurred during the SARS-CoV-2 public health emergency.  Safety protocols were in place, including screening questions prior to the visit, additional usage of staff PPE, and extensive cleaning of exam room while observing appropriate contact time as indicated for disinfecting solutions.  ? ?

## 2021-10-13 NOTE — Patient Instructions (Addendum)
Please schedule a Medicare Wellness visit. ? ?For back pain: ?Stay active ?Tylenol  500 mg OTC 2 tabs a day every 8 hours as needed for pain ?You also can try cyclobenzaprine 10 mg at bedtime to see if that helps with the leg cramps ?

## 2021-10-15 NOTE — Assessment & Plan Note (Signed)
DJD: ?Has pain at the knees, shoulders and back. ?Her main concern today is back pain, she is somewhat frustrated by the pain however she is not taking any medication consistently. ?Saw Dr. Nelva Bush, he suspected spinal stenosis, MRI was done, results unknown to me or the pt. ?From my standpoint recommend the following: ?Stay active, judicious use of Tylenol, no NSAIDs, Flexeril nightly as needed leg cramps, watch for somnolence.  Encouraged to keep an appointment with Dr. Nelva Bush 10/25/2021 understanding that if this is DJD there may not be treatment to "cure" her back pain. ?She verbalized understanding. ?  ?

## 2021-10-17 DIAGNOSIS — M9902 Segmental and somatic dysfunction of thoracic region: Secondary | ICD-10-CM | POA: Diagnosis not present

## 2021-10-17 DIAGNOSIS — M5124 Other intervertebral disc displacement, thoracic region: Secondary | ICD-10-CM | POA: Diagnosis not present

## 2021-10-17 DIAGNOSIS — M9903 Segmental and somatic dysfunction of lumbar region: Secondary | ICD-10-CM | POA: Diagnosis not present

## 2021-10-17 DIAGNOSIS — M5126 Other intervertebral disc displacement, lumbar region: Secondary | ICD-10-CM | POA: Diagnosis not present

## 2021-10-17 DIAGNOSIS — M5022 Other cervical disc displacement, mid-cervical region, unspecified level: Secondary | ICD-10-CM | POA: Diagnosis not present

## 2021-10-17 DIAGNOSIS — M9901 Segmental and somatic dysfunction of cervical region: Secondary | ICD-10-CM | POA: Diagnosis not present

## 2021-10-24 DIAGNOSIS — M5124 Other intervertebral disc displacement, thoracic region: Secondary | ICD-10-CM | POA: Diagnosis not present

## 2021-10-24 DIAGNOSIS — M9902 Segmental and somatic dysfunction of thoracic region: Secondary | ICD-10-CM | POA: Diagnosis not present

## 2021-10-24 DIAGNOSIS — M5126 Other intervertebral disc displacement, lumbar region: Secondary | ICD-10-CM | POA: Diagnosis not present

## 2021-10-24 DIAGNOSIS — M9901 Segmental and somatic dysfunction of cervical region: Secondary | ICD-10-CM | POA: Diagnosis not present

## 2021-10-24 DIAGNOSIS — M5022 Other cervical disc displacement, mid-cervical region, unspecified level: Secondary | ICD-10-CM | POA: Diagnosis not present

## 2021-10-24 DIAGNOSIS — M9903 Segmental and somatic dysfunction of lumbar region: Secondary | ICD-10-CM | POA: Diagnosis not present

## 2021-10-30 DIAGNOSIS — M9902 Segmental and somatic dysfunction of thoracic region: Secondary | ICD-10-CM | POA: Diagnosis not present

## 2021-10-30 DIAGNOSIS — M9903 Segmental and somatic dysfunction of lumbar region: Secondary | ICD-10-CM | POA: Diagnosis not present

## 2021-10-30 DIAGNOSIS — M5022 Other cervical disc displacement, mid-cervical region, unspecified level: Secondary | ICD-10-CM | POA: Diagnosis not present

## 2021-10-30 DIAGNOSIS — M5124 Other intervertebral disc displacement, thoracic region: Secondary | ICD-10-CM | POA: Diagnosis not present

## 2021-10-30 DIAGNOSIS — M9901 Segmental and somatic dysfunction of cervical region: Secondary | ICD-10-CM | POA: Diagnosis not present

## 2021-10-30 DIAGNOSIS — M5126 Other intervertebral disc displacement, lumbar region: Secondary | ICD-10-CM | POA: Diagnosis not present

## 2021-10-31 DIAGNOSIS — M5416 Radiculopathy, lumbar region: Secondary | ICD-10-CM | POA: Diagnosis not present

## 2021-11-16 DIAGNOSIS — M1711 Unilateral primary osteoarthritis, right knee: Secondary | ICD-10-CM | POA: Diagnosis not present

## 2021-11-28 DIAGNOSIS — M5416 Radiculopathy, lumbar region: Secondary | ICD-10-CM | POA: Diagnosis not present

## 2021-11-29 DIAGNOSIS — M25561 Pain in right knee: Secondary | ICD-10-CM | POA: Diagnosis not present

## 2021-12-06 DIAGNOSIS — M1711 Unilateral primary osteoarthritis, right knee: Secondary | ICD-10-CM | POA: Diagnosis not present

## 2021-12-11 DIAGNOSIS — M545 Low back pain, unspecified: Secondary | ICD-10-CM | POA: Diagnosis not present

## 2021-12-11 DIAGNOSIS — M25561 Pain in right knee: Secondary | ICD-10-CM | POA: Diagnosis not present

## 2021-12-13 ENCOUNTER — Encounter: Payer: Self-pay | Admitting: *Deleted

## 2021-12-25 DIAGNOSIS — M545 Low back pain, unspecified: Secondary | ICD-10-CM | POA: Diagnosis not present

## 2021-12-25 DIAGNOSIS — M25561 Pain in right knee: Secondary | ICD-10-CM | POA: Diagnosis not present

## 2021-12-29 ENCOUNTER — Encounter: Payer: Self-pay | Admitting: *Deleted

## 2022-01-10 DIAGNOSIS — H35363 Drusen (degenerative) of macula, bilateral: Secondary | ICD-10-CM | POA: Diagnosis not present

## 2022-01-10 DIAGNOSIS — H5202 Hypermetropia, left eye: Secondary | ICD-10-CM | POA: Diagnosis not present

## 2022-01-10 DIAGNOSIS — H52223 Regular astigmatism, bilateral: Secondary | ICD-10-CM | POA: Diagnosis not present

## 2022-01-10 DIAGNOSIS — H353131 Nonexudative age-related macular degeneration, bilateral, early dry stage: Secondary | ICD-10-CM | POA: Insufficient documentation

## 2022-01-10 DIAGNOSIS — H04123 Dry eye syndrome of bilateral lacrimal glands: Secondary | ICD-10-CM | POA: Diagnosis not present

## 2022-01-10 DIAGNOSIS — H59812 Chorioretinal scars after surgery for detachment, left eye: Secondary | ICD-10-CM | POA: Insufficient documentation

## 2022-01-10 DIAGNOSIS — H524 Presbyopia: Secondary | ICD-10-CM | POA: Diagnosis not present

## 2022-01-16 ENCOUNTER — Ambulatory Visit (INDEPENDENT_AMBULATORY_CARE_PROVIDER_SITE_OTHER): Payer: Medicare Other | Admitting: Internal Medicine

## 2022-01-16 ENCOUNTER — Encounter: Payer: Self-pay | Admitting: Internal Medicine

## 2022-01-16 VITALS — BP 146/86 | HR 76 | Temp 98.4°F | Resp 18 | Ht 60.0 in | Wt 179.4 lb

## 2022-01-16 DIAGNOSIS — I10 Essential (primary) hypertension: Secondary | ICD-10-CM | POA: Diagnosis not present

## 2022-01-16 DIAGNOSIS — M7989 Other specified soft tissue disorders: Secondary | ICD-10-CM | POA: Diagnosis not present

## 2022-01-16 DIAGNOSIS — M159 Polyosteoarthritis, unspecified: Secondary | ICD-10-CM

## 2022-01-16 DIAGNOSIS — R609 Edema, unspecified: Secondary | ICD-10-CM | POA: Diagnosis not present

## 2022-01-16 MED ORDER — HYDROCHLOROTHIAZIDE 25 MG PO TABS: 25.0000 mg | ORAL_TABLET | Freq: Every day | ORAL | 0 refills | Status: DC

## 2022-01-16 NOTE — Progress Notes (Unsigned)
Subjective:    Patient ID: Marie Hebert, female    DOB: Aug 03, 1932, 88 y.o.   MRN: [id number]  DOS:  01/16/2022 Type of visit - description: Chief complaint is fluid retention  The patient reports that her right knee is swelling and very painful. She also thinks that he is getting a little weight and she has lower extremity edema. Because of above, she is stop Cardizem after she read the side effects.  She continue with pain at the right knee, right shoulder and back.  Wt Readings from Last 3 Encounters:  01/16/22 179 lb 6 oz (81.4 kg)  10/13/21 175 lb 4 oz (79.5 kg)  07/10/21 176 lb 2 oz (79.9 kg)     Review of Systems See above   Past Medical History:  Diagnosis Date   Back pain    Dizziness    Hypertension    Palpitations     Past Surgical History:  Procedure Laterality Date   CATARACT EXTRACTION  2010   right   COLONOSCOPY  2003   LAPAROSCOPIC APPENDECTOMY N/A 09/25/2020   Procedure: APPENDECTOMY LAPAROSCOPIC;  Surgeon: Mickeal Skinner, MD;  Location: [facility];  Service: General;  Laterality: N/A;   RETINAL LASER PROCEDURE      Current Outpatient Medications  Medication Instructions   Cholecalciferol (VITAMIN D) 125 MCG (5000 UT) CAPS 1 capsule, Oral, Daily   cyclobenzaprine (FLEXERIL) 10 mg, Oral, At bedtime PRN   diltiazem (CARDIZEM CD) 120 MG 24 hr capsule TAKE 1 CAPSULE BY MOUTH EVERY DAY   Magnesium 500 MG CAPS 1 capsule, Oral, Daily,     Multiple Vitamins-Minerals (ONE-A-DAY WOMENS 50+ PO) 1 tablet, Oral, Daily   Multiple Vitamins-Minerals (PRESERVISION AREDS 2 PO) Oral   Omega-3 Fatty Acids (FISH OIL) 1000 MG CAPS 1 capsule, Oral, Daily,     vitamin C (ASCORBIC ACID) 500 mg, Daily       Objective:   Physical Exam BP (!) 146/86   Pulse 76   Temp 98.4 F (36.9 C) (Oral)   Resp 18   Ht 5' (1.524 m)   Wt 179 lb 6 oz (81.4 kg)   SpO2 97%   BMI 35.03 kg/m  General:   Well developed, NAD, BMI noted. HEENT:  Normocephalic . Face  symmetric, atraumatic Lungs:  CTA B Normal respiratory effort, no intercostal retractions, no accessory muscle use. Heart: RRR,  no murmur.  Lower extremities: Trace (if any) Peri ankle edema. R  Calf is slightly larger, approximately 1/3  inch larger in circumference.  Slightly TTP without obvious phlebitis or redness Both knees have bony changes consistent with DJD, right side with possibly a small amount of joint effusion. Skin: Not pale. Not jaundice Neurologic:  alert & oriented X3.  Speech normal, gait appropriate for age and unassisted Psych--  Cognition and judgment appear intact.  Cooperative with normal attention span and concentration.  Behavior appropriate. No anxious or depressed appearing.      Assessment       Assessment  HTN (h/o palpitations, on cardiazem) DJD Recurrent UTIs; extensive urology w/u  2015:atrophic vaginitis, eventually decided to try hormonal creams Back pain, chronic, needs a disability parking  Palpitations: Saw cardiology 05-2017: Holter, echo, stress echo essentially negative Osteopenia: T score -1.5 (02/2016) Macular degeneration COVID-19, DX 11/2020  PLAN: DJD: Continue to have pain at the right knee, right shoulder, back. Saw Dr. Marlou Sa in 2022. Saw Dr. Delilah Shan May 2023, had a right knee local injection, subsequently MRI (patient was told  based on the MRI that no surgery was indicated). On exam she has changes consistent with DJD and possibly small effusion of the right knee, no fever or chills. We had a long conversation about this will be a lifelong problem, conservative treatment is indicated, she will not take any strong pain killers.  Thus will recommend a cane, Tylenol, ice, Voltaren gel. HTN: Self DC Cardizem, she thinks she has "gained fluid" however the knee swelling is not related to CCB's.  This was explained to the patient but she states that she "will not take that again". We will switch her to hydrochlorothiazide, BMP in 2  weeks.  She was happy to know that this will help remove fluid although I do not think she is volume overloaded. Calf swelling: See physical exam, unilateral calf swelling, check ultrasound rule out DVT RTC labs 2 weeks RTC office visit 3 months  Multiple questions answered, plan repeated multiple times to the patient, time spent 33 minutes  DJD: Has pain at the knees, shoulders and back. Her main concern today is back pain, she is somewhat frustrated by the pain however she is not taking any medication consistently. Saw Dr. Nelva Bush, he suspected spinal stenosis, MRI was done, results unknown to me or the pt. From my standpoint recommend the following: Stay active, judicious use of Tylenol, no NSAIDs, Flexeril nightly as needed leg cramps, watch for somnolence.  Encouraged to keep an appointment with Dr. Nelva Bush 10/25/2021 understanding that if this is DJD there may not be treatment to "cure" her back pain. She verbalized understanding.

## 2022-01-16 NOTE — Patient Instructions (Signed)
Start taking medication called hydrochlorothiazide 25 mg: 1 tablet a day.  Check the  blood pressure regularly BP GOAL is between 110/65 and  135/85. If it is consistently higher or lower, let me know   For the knee pain: Use a cane Icing Tylenol Voltaren gel   GO TO THE FRONT DESK, PLEASE SCHEDULE YOUR APPOINTMENTS Come back for blood work in 10 days to check your potassium     Come back for a checkup in 3 months  STOP BY THE FIRST FLOOR: Schedule ultrasound to be sure you do not have a clot

## 2022-01-17 ENCOUNTER — Ambulatory Visit (HOSPITAL_BASED_OUTPATIENT_CLINIC_OR_DEPARTMENT_OTHER)
Admission: RE | Admit: 2022-01-17 | Discharge: 2022-01-17 | Disposition: A | Discharge status: Home / Self Care | Payer: Medicare Other | Source: Ambulatory Visit | Attending: Internal Medicine | Admitting: Internal Medicine

## 2022-01-17 DIAGNOSIS — R609 Edema, unspecified: Secondary | ICD-10-CM | POA: Diagnosis not present

## 2022-01-17 DIAGNOSIS — M7989 Other specified soft tissue disorders: Secondary | ICD-10-CM | POA: Diagnosis not present

## 2022-01-17 NOTE — Assessment & Plan Note (Signed)
DJD: Continue to have pain at the right knee, right shoulder, back. Saw Dr. Marlou Sa in 2022. Saw Dr. Delilah Shan May 2023, had a right knee local injection, subsequently MRI (patient was told based on the MRI that no surgery was indicated). On exam she has changes consistent with DJD and possibly small effusion of the right knee, no fever or chills. We had a long conversation about this will be a lifelong problem. We will continue with only conservative treatment as she will not take any strong pain killers or accept surgery; will recommend a cane, Tylenol, ice, Voltaren gel. HTN: Self DC Cardizem, she thinks she has "gained fluid" however the knee swelling is not related to CCB's.  This was explained to the patient but she states that she "will not take that again". We will switch her to hydrochlorothiazide, BMP in 2 weeks.  She was happy to know that this will help remove fluid although I do not think she is volume overloaded. Calf swelling: See physical exam, unilateral calf swelling, check ultrasound rule out DVT RTC labs 2 weeks RTC office visit 3 months

## 2022-01-19 ENCOUNTER — Ambulatory Visit: Payer: Medicare Other | Admitting: Internal Medicine

## 2022-01-22 ENCOUNTER — Telehealth: Payer: Self-pay

## 2022-01-22 NOTE — Telephone Encounter (Signed)
Pt called for ultrasound results, made her aware of those results but also informed that PCP sent results to her via Mychart- she informed she wasn't able to log in. I offered to deactivate mychart so she would get results via mail. Pt declined.

## 2022-01-26 ENCOUNTER — Other Ambulatory Visit (INDEPENDENT_AMBULATORY_CARE_PROVIDER_SITE_OTHER): Payer: Medicare Other

## 2022-01-26 DIAGNOSIS — I10 Essential (primary) hypertension: Secondary | ICD-10-CM

## 2022-01-26 LAB — BASIC METABOLIC PANEL
BUN: 17 mg/dL (ref 6–23)
CO2: 31 mEq/L (ref 19–32)
Calcium: 9.5 mg/dL (ref 8.4–10.5)
Chloride: 99 mEq/L (ref 96–112)
Creatinine, Ser: 0.62 mg/dL (ref 0.40–1.20)
GFR: 80.26 mL/min (ref >60.00)
Glucose, Bld: 107 mg/dL — ABNORMAL HIGH (ref 70–99)
Potassium: 4.2 mEq/L (ref 3.5–5.1)
Sodium: 137 mEq/L (ref 135–145)

## 2022-01-30 MED ORDER — HYDROCHLOROTHIAZIDE 25 MG PO TABS: 25.0000 mg | ORAL_TABLET | Freq: Every day | ORAL | 1 refills | Status: DC

## 2022-01-30 NOTE — Addendum Note (Signed)
Addended byDamita Dunnings D on: 01/30/2022 08:08 AM   Modules accepted: Orders

## 2022-03-06 ENCOUNTER — Telehealth: Payer: Self-pay | Admitting: Internal Medicine

## 2022-03-06 NOTE — Telephone Encounter (Signed)
Please advise 

## 2022-03-06 NOTE — Telephone Encounter (Signed)
Pt called stating that she needed a note from Dr. Larose Kells for regarding discontinuing her massage therapy. Pt stated that she had signed up for a year and after one session, she was left with a limited ability to walk. She is concerned that it is doing more harm than good and went to them to see about discontinuing this service. Clinic told her that she would have to have a note from Dr. Larose Kells stating that this was not good for her. Please Advise.

## 2022-03-07 NOTE — Telephone Encounter (Signed)
Okay to write a letter: In my opinion, the patient is not benefiting from massage therapy

## 2022-03-07 NOTE — Telephone Encounter (Signed)
Spoke w/ Pt- informed letter is ready at front desk for pick up. Pt verbalized understanding.

## 2022-04-19 ENCOUNTER — Ambulatory Visit: Payer: Medicare Other | Admitting: Internal Medicine

## 2022-04-26 ENCOUNTER — Encounter: Payer: Self-pay | Admitting: Internal Medicine

## 2022-04-26 ENCOUNTER — Ambulatory Visit (INDEPENDENT_AMBULATORY_CARE_PROVIDER_SITE_OTHER): Payer: Medicare Other | Admitting: Internal Medicine

## 2022-04-26 VITALS — BP 142/80 | HR 83 | Temp 97.6°F | Resp 18 | Ht 60.0 in | Wt 178.2 lb

## 2022-04-26 DIAGNOSIS — M25561 Pain in right knee: Secondary | ICD-10-CM

## 2022-04-26 DIAGNOSIS — I10 Essential (primary) hypertension: Secondary | ICD-10-CM | POA: Diagnosis not present

## 2022-04-26 DIAGNOSIS — E559 Vitamin D deficiency, unspecified: Secondary | ICD-10-CM

## 2022-04-26 LAB — CBC WITH DIFFERENTIAL/PLATELET
Basophils Absolute: 0.1 10*3/uL (ref 0.0–0.1)
Basophils Relative: 1 % (ref 0.0–3.0)
Eosinophils Absolute: 0.2 10*3/uL (ref 0.0–0.7)
Eosinophils Relative: 3.7 % (ref 0.0–5.0)
HCT: 41.2 % (ref 36.0–46.0)
Hemoglobin: 13.5 g/dL (ref 12.0–15.0)
Lymphocytes Relative: 35.5 % (ref 12.0–46.0)
Lymphs Abs: 2.1 10*3/uL (ref 0.7–4.0)
MCHC: 32.7 g/dL (ref 30.0–36.0)
MCV: 90.5 fl (ref 78.0–100.0)
Monocytes Absolute: 0.6 10*3/uL (ref 0.1–1.0)
Monocytes Relative: 9.3 % (ref 3.0–12.0)
Neutro Abs: 3 10*3/uL (ref 1.4–7.7)
Neutrophils Relative %: 50.5 % (ref 43.0–77.0)
Platelets: 215 10*3/uL (ref 150.0–400.0)
RBC: 4.55 Mil/uL (ref 3.87–5.11)
RDW: 14.8 % (ref 11.5–15.5)
WBC: 5.9 10*3/uL (ref 4.0–10.5)

## 2022-04-26 LAB — VITAMIN D 25 HYDROXY (VIT D DEFICIENCY, FRACTURES): VITD: 79.72 ng/mL (ref 30.00–100.00)

## 2022-04-26 NOTE — Progress Notes (Signed)
   Subjective:    Patient ID: Marie Hebert, female    DOB: [birth year], 88 y.o.   MRN: [id number]  DOS:  04/26/2022 Type of visit - description: f/u  Follow-up from previous visit. Continue with pain at the right leg/knee. HTN: Stopped HCTZ after she ran out.  Reports normal ambulatory BPs.  Review of Systems See above   Past Medical History:  Diagnosis Date   Back pain    Dizziness    Hypertension    Palpitations     Past Surgical History:  Procedure Laterality Date   CATARACT EXTRACTION  2010   right   COLONOSCOPY  2003   LAPAROSCOPIC APPENDECTOMY N/A 09/25/2020   Procedure: APPENDECTOMY LAPAROSCOPIC;  Surgeon: Mickeal Skinner, MD;  Location: [facility];  Service: General;  Laterality: N/A;   RETINAL LASER PROCEDURE      Current Outpatient Medications  Medication Instructions   ascorbic acid (VITAMIN C) 500 mg, Daily   Cholecalciferol (VITAMIN D) 125 MCG (5000 UT) CAPS 1 capsule, Oral, Daily   cyclobenzaprine (FLEXERIL) 10 mg, Oral, At bedtime PRN   hydrochlorothiazide (HYDRODIURIL) 25 mg, Oral, Daily   Magnesium 500 MG CAPS 1 capsule, Oral, Daily,     Multiple Vitamins-Minerals (ONE-A-DAY WOMENS 50+ PO) 1 tablet, Oral, Daily   Multiple Vitamins-Minerals (PRESERVISION AREDS 2 PO) Oral   Omega-3 Fatty Acids (FISH OIL) 1000 MG CAPS 1 capsule, Oral, Daily,         Objective:   Physical Exam BP (!) 144/88   Pulse 83   Temp 97.6 F (36.4 C) (Oral)   Resp 18   Ht 5' (1.524 m)   Wt 178 lb 4 oz (80.9 kg)   SpO2 97%   BMI 34.81 kg/m  General:   Well developed, NAD, BMI noted. HEENT:  Normocephalic . Face symmetric, atraumatic Lungs:  CTA B Normal respiratory effort, no intercostal retractions, no accessory muscle use. Heart: RRR,  no murmur.  Lower extremities: no pretibial edema bilaterally  Skin: Not pale. Not jaundice Neurologic:  alert & oriented X3.  Speech normal, gait appropriate for age and unassisted Psych--  Cognition and judgment appear  intact.  Cooperative with normal attention span and concentration.  Behavior appropriate. No anxious or depressed appearing.      Assessment     Assessment  HTN (h/o palpitations, on cardiazem) DJD Recurrent UTIs; extensive urology w/u  2015:atrophic vaginitis, eventually decided to try hormonal creams Back pain, chronic, needs a disability parking  Palpitations: Saw cardiology 05-2017: Holter, echo, stress echo essentially negative Osteopenia: T score -1.5 (02/2016) Macular degeneration COVID-19, DX 11/2020  PLAN: HTN: At the last visit, cardiazem stopped, started HCTZ, follow-up BMP okay but she ran out of HCTZ and did not restart it.  BP today slightly elevated, at home reportedly normal around 116/62.  Recommend to restart HCTZ, pt declined, advised to watch BPs closely.  See AVS.  Check CBC Vitamin D deficiency: On oral supplements, checking levels. Calf swelling: See last visit, ultrasound negative for DVT DJD: Ongoing right knee pain, likes to see the PT service located in this building.  Will arrange RTC 6 months

## 2022-04-26 NOTE — Patient Instructions (Addendum)
Continue over-the-counter vitamin D  Check the  blood pressure regularly BP GOAL is between 110/65 and  135/85. If it is consistently higher or lower, let me know     GO TO THE LAB : Get the blood work     [place], [place] back for a checkup in 6 months

## 2022-04-27 NOTE — Assessment & Plan Note (Signed)
HTN: At the last visit, cardiazem stopped, started HCTZ, follow-up BMP okay but she ran out of HCTZ and did not restart it.  BP today slightly elevated, at home reportedly normal around 116/62.  Recommend to restart HCTZ, pt declined, advised to watch BPs closely.  See AVS.  Check CBC Vitamin D deficiency: On oral supplements, checking levels. Calf swelling: See last visit, ultrasound negative for DVT DJD: Ongoing right knee pain, likes to see the PT service located in this building.  Will arrange RTC 6 months

## 2022-05-22 NOTE — Therapy (Signed)
OUTPATIENT PHYSICAL THERAPY LOWER EXTREMITY EVALUATION   Patient Name: Marie Hebert MRN: [id number] DOB:12-18-1932, 88 y.o., female Today's Date: 05/23/2022  END OF SESSION:  PT End of Session - 05/23/22 1017     Visit Number 1    Date for PT Re-Evaluation 07/04/22    Authorization Type [employer] MCR    Progress Note Due on Visit 10    PT Start Time 1024    PT Stop Time 1113    PT Time Calculation (min) 49 min    Activity Tolerance Patient tolerated treatment well    Behavior During Therapy WFL for tasks assessed/performed             Past Medical History:  Diagnosis Date   Back pain    Dizziness    Hypertension    Palpitations    Past Surgical History:  Procedure Laterality Date   CATARACT EXTRACTION  2010   right   COLONOSCOPY  2003   LAPAROSCOPIC APPENDECTOMY N/A 09/25/2020   Procedure: APPENDECTOMY LAPAROSCOPIC;  Surgeon: Kinsinger, Arta Bruce, MD;  Location: [facility];  Service: General;  Laterality: N/A;   RETINAL LASER PROCEDURE     Patient Active Problem List   Diagnosis Date Noted   Early dry stage nonexudative age-related macular degeneration of both eyes 01/10/2022   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 01/10/2022   Status post surgery 09/25/2020   Essential hypertension 07/12/2017   PAC (premature atrial contraction) 07/12/2017   Mitral regurgitation 07/12/2017   Onychomycosis 08/10/2015   Pain in lower limb 08/10/2015   PCP NOTES>>> 04/06/2015   Skin lesion 03/18/2013   Annual physical exam 12/31/2011   DJD (degenerative joint disease) 12/31/2011   Vitamin D deficiency 12/31/2011   Recurrent UTI 10/10/2011   PALPITATIONS, OCCASIONAL 10/14/2007   BACK PAIN 09/16/2007    PCP: Colon Branch, MD   REFERRING PROVIDER: Colon Branch, MD   REFERRING DIAG: [phone] (ICD-10-CM) - Right knee pain, unspecified chronicity  THERAPY DIAG:  Chronic pain of right knee  Stiffness of right knee, not elsewhere classified  Cramp and  spasm  Unsteadiness on feet  Rationale for Evaluation and Treatment: Rehabilitation  ONSET DATE: March/April 2023  SUBJECTIVE:   SUBJECTIVE STATEMENT: Pain behind the R knee starting . Had 2 injections, first one helped, second did not. Pain is ongoing. She has pain with hip ABD in R knee. Pain when sleeping at night so can't sleep.  Limited to 15 min standing.   PERTINENT HISTORY:  LBP  PAIN:  Are you having pain? Yes: NPRS scale: 3 today up to 9/10 Pain location: behind the R knee Pain description: sometimes dull sometimes sharp Aggravating factors: sleeping, standing  Relieving factors: sitting  PRECAUTIONS: None  WEIGHT BEARING RESTRICTIONS: No  FALLS:  Has patient fallen in last 6 months? No  LIVING ENVIRONMENT: Lives with: lives alone Lives in: House/apartment Stairs: No Has following equipment at home: Single point cane  OCCUPATION: retired  PLOF: Independent  PATIENT GOALS: Get rid of her knee pain and know what is wrong.  NEXT MD VISIT:   OBJECTIVE:   DIAGNOSTIC FINDINGS:  [place] at Dr. Delilah Shan - negative per pt  2022: AP, lateral views of right knee reviewed.  Moderate degenerative changes  with joint space narrowing and subchondral sclerosis noted with marginal  osteophytes in the medial compartment primarily with some findings in the  lateral compartment as well.  No significant patellofemoral arthritis  noted.  No fracture or dislocation.  PATIENT SURVEYS:  LEFS 19 / 80 = 23.8 %  COGNITION: Overall cognitive status: Within functional limits for tasks assessed     SENSATION: WFL  MUSCLE LENGTH: HS: mild tightness Quads: mild tightness ITB: tight R Piriformis: mild R Hip Flexors: mild R Heelcords: mild R   POSTURE: No Significant postural limitations  PALPATION: Palpation: Marked TTP at R gastroc, HS, quads, ITB, R gluteals. Increased tone in post knee. Patellar Mobility: WNL bil     LOWER EXTREMITY ROM:  A/P ROM Right eval  Left eval  Hip flexion [facility]  Hip extension    Hip abduction    Hip adduction    Hip internal rotation    Hip external rotation    Knee flexion 103/116* 125  Knee extension -5/-2 full   (Blank rows = not tested) * pain  LOWER EXTREMITY MMT:  MMT Right eval Left eval  Hip flexion 4   Hip extension 5   Hip abduction 5   Hip adduction    Hip internal rotation    Hip external rotation    Knee flexion 5 mild pain   Knee extension 5   Ankle dorsiflexion 5   Ankle plantarflexion    Ankle inversion    Ankle eversion     (Blank rows = not tested)   FUNCTIONAL TESTS:  Difficulty with sit to stand transfer due to knee pain  GAIT: Distance walked:  40 Assistive device utilized: Single point cane Level of assistance: Modified independence Comments: antalgic, uses cane in R UE; cane adjusted to correct height   TODAY'S TREATMENT:                                                                                                                              DATE:   05/23/22 See Pt ED  PATIENT EDUCATION:  Education details: PT eval findings, anticipated POC, initial HEP, and role of DN Patient Person educated: Patient Education method: Explanation, Demonstration, Verbal cues, and Handouts Education comprehension: verbalized understanding and returned demonstration  HOME EXERCISE PROGRAM: Access Code: [email or web address] URL: [email or web address] Date: 05/23/2022 Prepared by: Almyra Free  Exercises - Long Sitting Calf Stretch with Strap  - 2 x daily - 7 x weekly - 1 sets - 3 reps - 30 sec hold - Modified Thomas Stretch  - 1 x daily - 7 x weekly - 1 sets - 3 reps - 30 sec hold - ITB Stretch at [employer] (Mirrored)  - 1 x daily - 7 x weekly - 1 sets - 3 reps - 30-60 hold  ASSESSMENT:  CLINICAL IMPRESSION: Patient is a 88 y.o. female who was seen today for physical therapy evaluation and treatment for chronic R knee pain. She has decreased R knee ROM, flexibility deficits and  significant tenderness and trigger points in her R LE. She has mild strength deficits with MMT, but has difficulty with sit to stand transfer. Pain limits her from standing for meal prep and ADLS  and limits her ability to walk for exercise. She uses a cane for stability and reports some intermittent dizziness. She denies andy falls. She will benefit from skilled PT to address these deficits.  OBJECTIVE IMPAIRMENTS: Abnormal gait, decreased balance, decreased ROM, decreased strength, increased fascial restrictions, increased muscle spasms, impaired flexibility, obesity, and pain.   ACTIVITY LIMITATIONS: standing, transfers, and locomotion level  PARTICIPATION LIMITATIONS: meal prep and community activity  PERSONAL FACTORS: Time since onset of injury/illness/exacerbation and 1 comorbidity: LBP  are also affecting patient's functional outcome.   REHAB POTENTIAL: Excellent  CLINICAL DECISION MAKING: Stable/uncomplicated  EVALUATION COMPLEXITY: Low   GOALS: Goals reviewed with patient? Yes  SHORT TERM GOALS: Target date: 06/06/2022 (Remove Blue Hyperlink)  Patient will be independent with initial HEP. Baseline:  Goal status: INITIAL  2.  Balance assesment completed Baseline:  Goal status: INITIAL    LONG TERM GOALS: Target date: 07/04/2022 (Remove Blue Hyperlink)  Patient will be independent with advanced/ongoing HEP to improve outcomes and carryover.  Baseline:  Goal status: INITIAL  2.  Patient will report at least 75% improvement in R knee pain to improve QOL. Baseline:  Goal status: INITIAL  3.  Patient will demonstrate improved R knee AROM to >/= 2--120 deg to allow for normal gait and stair mechanics. Baseline:  Goal status: INITIAL  4.  Patient will demonstrate improved functional LE strength as demonstrated by being able to complete 5xSTS in <=14.8 sec. Baseline:  Goal status: INITIAL  5.  Patient will be able to ambulate 600' with LRAD and normal gait pattern  without increased pain to access community.  Baseline:  Goal status: INITIAL  6. Patient will be stand to cook meals without increased R knee pain. Baseline:  Goal status: INITIAL  7.  Patient will report 28/80 on LEFS  to demonstrate improved functional ability. Baseline: 19 / 80 = 23.8 % Goal status: INITIAL  8.  Patient will demonstrate at least 19/24 on DGI to decrease risk of falls. Baseline: TBD Goal status: INITIAL   9.  Patient will be able to sleep without waking from knee pain Baseline:  Goal status: INITIAL   PLAN:  PT FREQUENCY: 2x/week  PT DURATION: 6 weeks  PLANNED INTERVENTIONS: Therapeutic exercises, Therapeutic activity, Neuromuscular re-education, Balance training, Gait training, Patient/Family education, Self Care, Joint mobilization, Aquatic Therapy, Dry Needling, Electrical stimulation, Spinal mobilization, Cryotherapy, Moist heat, Taping, Ultrasound, Ionotophoresis '4mg'$ /ml Dexamethasone, and Manual therapy  PLAN FOR NEXT SESSION: Review progress HEP, DN/MT to R LE (quads, HS, gastroc), Complete DGI, gait with SPC   Judith Campillo, PT 05/23/2022, 11:46 AM

## 2022-05-23 ENCOUNTER — Other Ambulatory Visit: Payer: Self-pay

## 2022-05-23 ENCOUNTER — Ambulatory Visit: Payer: Medicare Other | Attending: Internal Medicine | Admitting: Physical Therapy

## 2022-05-23 ENCOUNTER — Encounter: Payer: Self-pay | Admitting: Physical Therapy

## 2022-05-23 DIAGNOSIS — G8929 Other chronic pain: Secondary | ICD-10-CM | POA: Diagnosis not present

## 2022-05-23 DIAGNOSIS — M25661 Stiffness of right knee, not elsewhere classified: Secondary | ICD-10-CM | POA: Diagnosis not present

## 2022-05-23 DIAGNOSIS — M25561 Pain in right knee: Secondary | ICD-10-CM | POA: Diagnosis not present

## 2022-05-23 DIAGNOSIS — R252 Cramp and spasm: Secondary | ICD-10-CM | POA: Insufficient documentation

## 2022-05-23 DIAGNOSIS — R2681 Unsteadiness on feet: Secondary | ICD-10-CM | POA: Insufficient documentation

## 2022-05-29 NOTE — Therapy (Signed)
OUTPATIENT PHYSICAL THERAPY TREATMENT   Patient Name: Marie Hebert MRN: [id number] DOB:March 06, 1933, 88 y.o., female Today's Date: 05/30/2022  END OF SESSION:  PT End of Session - 05/30/22 1018     Visit Number 2    Date for PT Re-Evaluation 07/04/22    Authorization Type [employer] MCR    Progress Note Due on Visit 10    PT Start Time 1018    PT Stop Time 1115    PT Time Calculation (min) 57 min    Activity Tolerance Patient tolerated treatment well    Behavior During Therapy WFL for tasks assessed/performed              Past Medical History:  Diagnosis Date   Back pain    Dizziness    Hypertension    Palpitations    Past Surgical History:  Procedure Laterality Date   CATARACT EXTRACTION  2010   right   COLONOSCOPY  2003   LAPAROSCOPIC APPENDECTOMY N/A 09/25/2020   Procedure: APPENDECTOMY LAPAROSCOPIC;  Surgeon: Mickeal Skinner, MD;  Location: [place];  Service: General;  Laterality: N/A;   RETINAL LASER PROCEDURE     Patient Active Problem List   Diagnosis Date Noted   Early dry stage nonexudative age-related macular degeneration of both eyes 01/10/2022   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 01/10/2022   Status post surgery 09/25/2020   Essential hypertension 07/12/2017   PAC (premature atrial contraction) 07/12/2017   Mitral regurgitation 07/12/2017   Onychomycosis 08/10/2015   Pain in lower limb 08/10/2015   PCP NOTES>>> 04/06/2015   Skin lesion 03/18/2013   Annual physical exam 12/31/2011   DJD (degenerative joint disease) 12/31/2011   Vitamin D deficiency 12/31/2011   Recurrent UTI 10/10/2011   PALPITATIONS, OCCASIONAL 10/14/2007   BACK PAIN 09/16/2007    PCP: Colon Branch, MD   REFERRING PROVIDER: Colon Branch, MD   REFERRING DIAG: [phone] (ICD-10-CM) - Right knee pain, unspecified chronicity  THERAPY DIAG:  Chronic pain of right knee  Stiffness of right knee, not elsewhere classified  Cramp and spasm  Unsteadiness  on feet  Rationale for Evaluation and Treatment: Rehabilitation  ONSET DATE: March/April 2023  SUBJECTIVE:   SUBJECTIVE STATEMENT: No change   PERTINENT HISTORY:  LBP  PAIN:  Are you having pain? Yes: NPRS scale: 3 today up to 9/10 Pain location: behind the R knee Pain description: sometimes dull sometimes sharp Aggravating factors: sleeping, standing  Relieving factors: sitting  PRECAUTIONS: None  WEIGHT BEARING RESTRICTIONS: No  FALLS:  Has patient fallen in last 6 months? No  LIVING ENVIRONMENT: Lives with: lives alone Lives in: House/apartment Stairs: No Has following equipment at home: Single point cane  OCCUPATION: retired  PLOF: Independent  PATIENT GOALS: Get rid of her knee pain and know what is wrong.  NEXT MD VISIT:   OBJECTIVE:   DIAGNOSTIC FINDINGS:  [place] at Dr. Delilah Shan - negative per pt  2022: AP, lateral views of right knee reviewed.  Moderate degenerative changes  with joint space narrowing and subchondral sclerosis noted with marginal  osteophytes in the medial compartment primarily with some findings in the  lateral compartment as well.  No significant patellofemoral arthritis  noted.  No fracture or dislocation.   PATIENT SURVEYS:  LEFS 19 / 80 = 23.8 %  COGNITION: Overall cognitive status: Within functional limits for tasks assessed     SENSATION: WFL  MUSCLE LENGTH: HS: mild tightness Quads: mild tightness ITB: tight R  Piriformis: mild R Hip Flexors: mild R Heelcords: mild R   POSTURE: No Significant postural limitations  PALPATION: Palpation: Marked TTP at R gastroc, HS, quads, ITB, R gluteals. Increased tone in post knee. Patellar Mobility: WNL bil     LOWER EXTREMITY ROM:  A/P ROM Right eval Left eval  Hip flexion [facility]  Hip extension    Hip abduction    Hip adduction    Hip internal rotation    Hip external rotation    Knee flexion 103/116* 125  Knee extension -5/-2 full   (Blank rows = not tested) *  pain  LOWER EXTREMITY MMT:  MMT Right eval Left eval  Hip flexion 4   Hip extension 5   Hip abduction 5   Hip adduction    Hip internal rotation    Hip external rotation    Knee flexion 5 mild pain   Knee extension 5   Ankle dorsiflexion 5   Ankle plantarflexion    Ankle inversion    Ankle eversion     (Blank rows = not tested)   FUNCTIONAL TESTS:  Difficulty with sit to stand transfer due to knee pain  GAIT: Distance walked:  40 Assistive device utilized: Single point cane Level of assistance: Modified independence Comments: antalgic, uses cane in R UE; cane adjusted to correct height   TODAY'S TREATMENT:                                                                                                                              DATE:   05/30/22  There. Activity: DGI completed   Gait: 40 feet with SPC in L hand - decreased heel strike R>L, decreased step length left  Manual Therapy: to decrease muscle spasm and pain and improve mobility Skilled palpation and monitoring of soft tissues during DN STM to R gastroc, HS and lateral quads Trigger Point Dry-Needling  Treatment instructions: Expect mild to moderate muscle soreness. S/S of pneumothorax if dry needled over a lung field, and to seek immediate medical attention should they occur. Patient verbalized understanding of these instructions and education. Patient Consent Given: Yes Education handout provided: Yes Muscles treated: R gastroc, popliteus, HS and lateral quads Electrical stimulation performed: No Parameters: N/A Treatment response/outcome: Twitch Response Elicited and Palpable Increase in Muscle Length   Modalities: MH to R quads/HS x 10 min  05/23/22 See Pt ED  PATIENT EDUCATION:  Education details: PT eval findings, anticipated POC, initial HEP, and role of DN Patient Person educated: Patient Education method: Explanation, Demonstration, Verbal cues, and Handouts Education comprehension:  verbalized understanding and returned demonstration  HOME EXERCISE PROGRAM: Access Code: [email or web address] URL: [email or web address] Date: 05/23/2022 Prepared by: Almyra Free  Exercises - Long Sitting Calf Stretch with Strap  - 2 x daily - 7 x weekly - 1 sets - 3 reps - 30 sec hold - Modified Thomas Stretch  - 1 x daily - 7 x weekly - 1 sets -  3 reps - 30 sec hold - ITB Stretch at Wall (Mirrored)  - 1 x daily - 7 x weekly - 1 sets - 3 reps - 30-60 hold  ASSESSMENT:  CLINICAL IMPRESSION: Melissa demonstrated good coordination with SPC today in her left UE. She has decreased heel strike bil. DGI completed with score of 17 indicating she is at high risk for falls. Initial trial of DN/MT with good response especially in quads.  OBJECTIVE IMPAIRMENTS: Abnormal gait, decreased balance, decreased ROM, decreased strength, increased fascial restrictions, increased muscle spasms, impaired flexibility, obesity, and pain.   ACTIVITY LIMITATIONS: standing, transfers, and locomotion level  PARTICIPATION LIMITATIONS: meal prep and community activity  PERSONAL FACTORS: Time since onset of injury/illness/exacerbation and 1 comorbidity: LBP  are also affecting patient's functional outcome.   REHAB POTENTIAL: Excellent  CLINICAL DECISION MAKING: Stable/uncomplicated  EVALUATION COMPLEXITY: Low   GOALS: Goals reviewed with patient? Yes  SHORT TERM GOALS: Target date: 06/06/2022 (Remove Blue Hyperlink)  Patient will be independent with initial HEP. Baseline:  Goal status: INITIAL  2.  Balance assesment completed Baseline:  Goal status: MET    LONG TERM GOALS: Target date: 07/04/2022 (Remove Blue Hyperlink)  Patient will be independent with advanced/ongoing HEP to improve outcomes and carryover.  Baseline:  Goal status: INITIAL  2.  Patient will report at least 75% improvement in R knee pain to improve QOL. Baseline:  Goal status: INITIAL  3.  Patient will demonstrate improved R  knee AROM to >/= 2--120 deg to allow for normal gait and stair mechanics. Baseline:  Goal status: INITIAL  4.  Patient will demonstrate improved functional LE strength as demonstrated by being able to complete 5xSTS in <=14.8 sec. Baseline:  Goal status: INITIAL  5.  Patient will be able to ambulate 600' with LRAD and normal gait pattern without increased pain to access community.  Baseline:  Goal status: INITIAL  6. Patient will be stand to cook meals without increased R knee pain. Baseline:  Goal status: INITIAL  7.  Patient will report 28/80 on LEFS  to demonstrate improved functional ability. Baseline: 19 / 80 = 23.8 % Goal status: INITIAL  8.  Patient will demonstrate at least 19/24 on DGI to decrease risk of falls. Baseline: TBD Goal status: INITIAL   9.  Patient will be able to sleep without waking from knee pain Baseline:  Goal status: INITIAL   PLAN:  PT FREQUENCY: 2x/week  PT DURATION: 6 weeks  PLANNED INTERVENTIONS: Therapeutic exercises, Therapeutic activity, Neuromuscular re-education, Balance training, Gait training, Patient/Family education, Self Care, Joint mobilization, Aquatic Therapy, Dry Needling, Electrical stimulation, Spinal mobilization, Cryotherapy, Moist heat, Taping, Ultrasound, Ionotophoresis 81m/ml Dexamethasone, and Manual therapy  PLAN FOR NEXT SESSION:  Review progress HEP, continue prn DN/MT to R LE (quads, HS, gastroc), functional LE strength including steps, balance Maley Venezia, PT 05/30/2022, 12:27 PM

## 2022-05-30 ENCOUNTER — Encounter: Payer: Self-pay | Admitting: Physical Therapy

## 2022-05-30 ENCOUNTER — Ambulatory Visit: Payer: Medicare Other | Attending: Internal Medicine | Admitting: Physical Therapy

## 2022-05-30 DIAGNOSIS — M25561 Pain in right knee: Secondary | ICD-10-CM | POA: Diagnosis not present

## 2022-05-30 DIAGNOSIS — R2681 Unsteadiness on feet: Secondary | ICD-10-CM | POA: Insufficient documentation

## 2022-05-30 DIAGNOSIS — R252 Cramp and spasm: Secondary | ICD-10-CM | POA: Insufficient documentation

## 2022-05-30 DIAGNOSIS — M25661 Stiffness of right knee, not elsewhere classified: Secondary | ICD-10-CM | POA: Diagnosis not present

## 2022-05-30 DIAGNOSIS — G8929 Other chronic pain: Secondary | ICD-10-CM | POA: Insufficient documentation

## 2022-06-04 ENCOUNTER — Encounter: Payer: Medicare Other | Admitting: Physical Therapy

## 2022-06-04 NOTE — Therapy (Signed)
OUTPATIENT PHYSICAL THERAPY TREATMENT   Patient Name: Marie Hebert MRN: [id number] DOB:09-29-1932, 88 y.o., female Today's Date: 06/05/2022  END OF SESSION:  PT End of Session - 06/05/22 1018     Visit Number 3    Date for PT Re-Evaluation 07/04/22    Authorization Type [employer] MCR    Progress Note Due on Visit 10    PT Start Time 1018    PT Stop Time 1104    PT Time Calculation (min) 46 min    Activity Tolerance Patient tolerated treatment well    Behavior During Therapy WFL for tasks assessed/performed               Past Medical History:  Diagnosis Date   Back pain    Dizziness    Hypertension    Palpitations    Past Surgical History:  Procedure Laterality Date   CATARACT EXTRACTION  2010   right   COLONOSCOPY  2003   LAPAROSCOPIC APPENDECTOMY N/A 09/25/2020   Procedure: APPENDECTOMY LAPAROSCOPIC;  Surgeon: Mickeal Skinner, MD;  Location: [place];  Service: General;  Laterality: N/A;   RETINAL LASER PROCEDURE     Patient Active Problem List   Diagnosis Date Noted   Early dry stage nonexudative age-related macular degeneration of both eyes 01/10/2022   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 01/10/2022   Status post surgery 09/25/2020   Essential hypertension 07/12/2017   PAC (premature atrial contraction) 07/12/2017   Mitral regurgitation 07/12/2017   Onychomycosis 08/10/2015   Pain in lower limb 08/10/2015   PCP NOTES>>> 04/06/2015   Skin lesion 03/18/2013   Annual physical exam 12/31/2011   DJD (degenerative joint disease) 12/31/2011   Vitamin D deficiency 12/31/2011   Recurrent UTI 10/10/2011   PALPITATIONS, OCCASIONAL 10/14/2007   BACK PAIN 09/16/2007    PCP: Colon Branch, MD   REFERRING PROVIDER: Colon Branch, MD   REFERRING DIAG: [phone] (ICD-10-CM) - Right knee pain, unspecified chronicity  THERAPY DIAG:  Chronic pain of right knee  Stiffness of right knee, not elsewhere classified  Cramp and  spasm  Unsteadiness on feet  Rationale for Evaluation and Treatment: Rehabilitation  ONSET DATE: March/April 2023  SUBJECTIVE:   SUBJECTIVE STATEMENT: Sunday I didn't have any pain at all and slept really well and then last night was horrible. At night it goes from my butt to my foot on the right.   PERTINENT HISTORY:  LBP  PAIN:  Are you having pain? Yes: NPRS scale: 5/10 Pain location: behind the R knee Pain description: sometimes dull sometimes sharp Aggravating factors: sleeping, standing  Relieving factors: sitting  PRECAUTIONS: None  WEIGHT BEARING RESTRICTIONS: No  FALLS:  Has patient fallen in last 6 months? No  LIVING ENVIRONMENT: Lives with: lives alone Lives in: House/apartment Stairs: No Has following equipment at home: Single point cane  OCCUPATION: retired  PLOF: Independent  PATIENT GOALS: Get rid of her knee pain and know what is wrong.  NEXT MD VISIT:   OBJECTIVE:   DIAGNOSTIC FINDINGS:  [place] at Dr. Delilah Shan - negative per pt  2022: AP, lateral views of right knee reviewed.  Moderate degenerative changes  with joint space narrowing and subchondral sclerosis noted with marginal  osteophytes in the medial compartment primarily with some findings in the  lateral compartment as well.  No significant patellofemoral arthritis  noted.  No fracture or dislocation.   PATIENT SURVEYS:  LEFS 19 / 80 = 23.8 %  COGNITION: Overall  cognitive status: Within functional limits for tasks assessed     SENSATION: WFL  MUSCLE LENGTH: HS: mild tightness Quads: mild tightness ITB: tight R Piriformis: mild R Hip Flexors: mild R Heelcords: mild R   POSTURE: No Significant postural limitations  PALPATION: Palpation: Marked TTP at R gastroc, HS, quads, ITB, R gluteals. Increased tone in post knee. Patellar Mobility: WNL bil     LOWER EXTREMITY ROM:  A/P ROM Right eval Left eval  Hip flexion [facility]  Hip extension    Hip abduction    Hip  adduction    Hip internal rotation    Hip external rotation    Knee flexion 103/116* 125  Knee extension -5/-2 full   (Blank rows = not tested) * pain  LOWER EXTREMITY MMT:  MMT Right eval Left eval  Hip flexion 4   Hip extension 5   Hip abduction 5   Hip adduction    Hip internal rotation    Hip external rotation    Knee flexion 5 mild pain   Knee extension 5   Ankle dorsiflexion 5   Ankle plantarflexion    Ankle inversion    Ankle eversion     (Blank rows = not tested)   FUNCTIONAL TESTS:  Difficulty with sit to stand transfer due to knee pain  GAIT: Distance walked:  40 Assistive device utilized: Single point cane Level of assistance: Modified independence Comments: antalgic, uses cane in R UE; cane adjusted to correct height  06/05/22 Low back assessed:  Full functional ROM, pain with UPA and CPA mobs to bil L3/4, L4/5, L5/S1 R >L - Pain decreases with repeated mobs. Marked tightness in bil gluteals, piriformis and lumbar paraspinals  R>L. Patient limited with prone press ups due to arm weakness.    TODAY'S TREATMENT:                                                                                                                              DATE:    06/05/22 Nustep L5 x 5 min Self MFR to R gluteals and lumbar using midsize ball.  Back assessed: see Objective   Manual Therapy: to decrease muscle spasm and pain and improve mobility Skilled palpation and monitoring of soft tissues during DN STM to R gluteals, piriformis, bil lumbar multifidi Trigger Point Dry-Needling  Treatment instructions: Expect mild to moderate muscle soreness. S/S of pneumothorax if dry needled over a lung field, and to seek immediate medical attention should they occur. Patient verbalized understanding of these instructions and education. Patient Consent Given: Yes Education handout provided: Yes Muscles treated: R gluteals, piriformis, bil lumbar multifidi Electrical stimulation  performed: No Parameters: N/A Treatment response/outcome: Twitch Response Elicited and Palpable Increase in Muscle Length  05/30/22  There. Activity: DGI completed   Gait: 40 feet with SPC in L hand - decreased heel strike R>L, decreased step length left  Manual Therapy: to decrease muscle spasm and pain and improve  mobility Skilled palpation and monitoring of soft tissues during DN STM to R gastroc, HS and lateral quads Trigger Point Dry-Needling  Treatment instructions: Expect mild to moderate muscle soreness. S/S of pneumothorax if dry needled over a lung field, and to seek immediate medical attention should they occur. Patient verbalized understanding of these instructions and education. Patient Consent Given: Yes Education handout provided: Yes Muscles treated: R gastroc, popliteus, HS and lateral quads Electrical stimulation performed: No Parameters: N/A Treatment response/outcome: Twitch Response Elicited and Palpable Increase in Muscle Length   Modalities: MH to R quads/HS x 10 min  05/23/22 See Pt ED  PATIENT EDUCATION:  Education details: PT eval findings, anticipated POC, initial HEP, and role of DN Patient Person educated: Patient Education method: Explanation, Demonstration, Verbal cues, and Handouts Education comprehension: verbalized understanding and returned demonstration  HOME EXERCISE PROGRAM: Access Code: [email or web address] URL: [email or web address] Date: 05/23/2022 Prepared by: Almyra Free  Exercises - Long Sitting Calf Stretch with Strap  - 2 x daily - 7 x weekly - 1 sets - 3 reps - 30 sec hold - Modified Thomas Stretch  - 1 x daily - 7 x weekly - 1 sets - 3 reps - 30 sec hold - ITB Stretch at [employer] (Mirrored)  - 1 x daily - 7 x weekly - 1 sets - 3 reps - 30-60 hold  ASSESSMENT:  CLINICAL IMPRESSION: [name] states she is not fully compliant with stretches. She feels the DN helped last time but her worst pain comes at night. She sleeps on her R side.  She has a h/o of low back pain so this was assessed today and she has significant pain and tightness with lumbar mobs and in bil hip musculature. She responded very well to DN/STM to the R hip and low back and would benefit from additional treatment to the left hip also. She has ongoing concerns about pain in her lateral knee and swelling as well.   OBJECTIVE IMPAIRMENTS: Abnormal gait, decreased balance, decreased ROM, decreased strength, increased fascial restrictions, increased muscle spasms, impaired flexibility, obesity, and pain.   ACTIVITY LIMITATIONS: standing, transfers, and locomotion level  PARTICIPATION LIMITATIONS: meal prep and community activity  PERSONAL FACTORS: Time since onset of injury/illness/exacerbation and 1 comorbidity: LBP  are also affecting patient's functional outcome.   REHAB POTENTIAL: Excellent  CLINICAL DECISION MAKING: Stable/uncomplicated  EVALUATION COMPLEXITY: Low   GOALS: Goals reviewed with patient? Yes  SHORT TERM GOALS: Target date: 06/06/2022 (Remove Blue Hyperlink)  Patient will be independent with initial HEP. Baseline:  Goal status: INITIAL  2.  Balance assesment completed Baseline:  Goal status: MET    LONG TERM GOALS: Target date: 07/04/2022 (Remove Blue Hyperlink)  Patient will be independent with advanced/ongoing HEP to improve outcomes and carryover.  Baseline:  Goal status: INITIAL  2.  Patient will report at least 75% improvement in R knee pain to improve QOL. Baseline:  Goal status: INITIAL  3.  Patient will demonstrate improved R knee AROM to >/= 2--120 deg to allow for normal gait and stair mechanics. Baseline:  Goal status: INITIAL  4.  Patient will demonstrate improved functional LE strength as demonstrated by being able to complete 5xSTS in <=14.8 sec. Baseline:  Goal status: INITIAL  5.  Patient will be able to ambulate 600' with LRAD and normal gait pattern without increased pain to access community.   Baseline:  Goal status: INITIAL  6. Patient will be stand to cook meals without increased R knee pain. Baseline:  Goal status:  INITIAL  7.  Patient will report 28/80 on LEFS  to demonstrate improved functional ability. Baseline: 19 / 80 = 23.8 % Goal status: INITIAL  8.  Patient will demonstrate at least 19/24 on DGI to decrease risk of falls. Baseline: TBD Goal status: INITIAL   9.  Patient will be able to sleep without waking from knee pain Baseline:  Goal status: INITIAL   PLAN:  PT FREQUENCY: 2x/week  PT DURATION: 6 weeks  PLANNED INTERVENTIONS: Therapeutic exercises, Therapeutic activity, Neuromuscular re-education, Balance training, Gait training, Patient/Family education, Self Care, Joint mobilization, Aquatic Therapy, Dry Needling, Electrical stimulation, Spinal mobilization, Cryotherapy, Moist heat, Taping, Ultrasound, Ionotophoresis 69m/ml Dexamethasone, and Manual therapy  PLAN FOR NEXT SESSION:  continue DN/MT to lumbar/glutes, and RLE prn, functional LE strength including steps, balance  Daren Yeagle, PT 06/05/2022, 11:40 AM

## 2022-06-05 ENCOUNTER — Encounter: Payer: Self-pay | Admitting: Physical Therapy

## 2022-06-05 ENCOUNTER — Ambulatory Visit: Payer: Medicare Other | Admitting: Physical Therapy

## 2022-06-05 DIAGNOSIS — R252 Cramp and spasm: Secondary | ICD-10-CM

## 2022-06-05 DIAGNOSIS — R2681 Unsteadiness on feet: Secondary | ICD-10-CM

## 2022-06-05 DIAGNOSIS — M25661 Stiffness of right knee, not elsewhere classified: Secondary | ICD-10-CM

## 2022-06-05 DIAGNOSIS — G8929 Other chronic pain: Secondary | ICD-10-CM | POA: Diagnosis not present

## 2022-06-05 DIAGNOSIS — M25561 Pain in right knee: Secondary | ICD-10-CM | POA: Diagnosis not present

## 2022-06-07 ENCOUNTER — Ambulatory Visit: Payer: Medicare Other

## 2022-06-07 DIAGNOSIS — G8929 Other chronic pain: Secondary | ICD-10-CM

## 2022-06-07 DIAGNOSIS — R2681 Unsteadiness on feet: Secondary | ICD-10-CM | POA: Diagnosis not present

## 2022-06-07 DIAGNOSIS — M25661 Stiffness of right knee, not elsewhere classified: Secondary | ICD-10-CM | POA: Diagnosis not present

## 2022-06-07 DIAGNOSIS — R252 Cramp and spasm: Secondary | ICD-10-CM

## 2022-06-07 DIAGNOSIS — M25561 Pain in right knee: Secondary | ICD-10-CM | POA: Diagnosis not present

## 2022-06-07 NOTE — Therapy (Signed)
OUTPATIENT PHYSICAL THERAPY TREATMENT   Patient Name: Marie Hebert MRN: [id number] DOB:1933/04/06, 88 y.o., female Today's Date: 06/07/2022  END OF SESSION:  PT End of Session - 06/07/22 1104     Visit Number 4    Date for PT Re-Evaluation 07/04/22    Authorization Type [employer] MCR    Progress Note Due on Visit 10    PT Start Time 1019    PT Stop Time 1100    PT Time Calculation (min) 41 min    Activity Tolerance Patient tolerated treatment well    Behavior During Therapy WFL for tasks assessed/performed                Past Medical History:  Diagnosis Date   Back pain    Dizziness    Hypertension    Palpitations    Past Surgical History:  Procedure Laterality Date   CATARACT EXTRACTION  2010   right   COLONOSCOPY  2003   LAPAROSCOPIC APPENDECTOMY N/A 09/25/2020   Procedure: APPENDECTOMY LAPAROSCOPIC;  Surgeon: Mickeal Skinner, MD;  Location: [place];  Service: General;  Laterality: N/A;   RETINAL LASER PROCEDURE     Patient Active Problem List   Diagnosis Date Noted   Early dry stage nonexudative age-related macular degeneration of both eyes 01/10/2022   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 01/10/2022   Status post surgery 09/25/2020   Essential hypertension 07/12/2017   PAC (premature atrial contraction) 07/12/2017   Mitral regurgitation 07/12/2017   Onychomycosis 08/10/2015   Pain in lower limb 08/10/2015   PCP NOTES>>> 04/06/2015   Skin lesion 03/18/2013   Annual physical exam 12/31/2011   DJD (degenerative joint disease) 12/31/2011   Vitamin D deficiency 12/31/2011   Recurrent UTI 10/10/2011   PALPITATIONS, OCCASIONAL 10/14/2007   BACK PAIN 09/16/2007    PCP: Colon Branch, MD   REFERRING PROVIDER: Colon Branch, MD   REFERRING DIAG: [phone] (ICD-10-CM) - Right knee pain, unspecified chronicity  THERAPY DIAG:  Chronic pain of right knee  Stiffness of right knee, not elsewhere classified  Cramp and  spasm  Unsteadiness on feet  Rationale for Evaluation and Treatment: Rehabilitation  ONSET DATE: March/April 2023  SUBJECTIVE:   SUBJECTIVE STATEMENT: Pt reports that she didn't sleep well last night, was up to an 8/10 pain.  PERTINENT HISTORY:  LBP  PAIN:  Are you having pain? Yes: NPRS scale: 7/10 Pain location: behind the R knee Pain description: sometimes dull sometimes sharp Aggravating factors: sleeping, standing  Relieving factors: sitting  PRECAUTIONS: None  WEIGHT BEARING RESTRICTIONS: No  FALLS:  Has patient fallen in last 6 months? No  LIVING ENVIRONMENT: Lives with: lives alone Lives in: House/apartment Stairs: No Has following equipment at home: Single point cane  OCCUPATION: retired  PLOF: Independent  PATIENT GOALS: Get rid of her knee pain and know what is wrong.  NEXT MD VISIT:   OBJECTIVE:   DIAGNOSTIC FINDINGS:  [place] at Dr. Delilah Shan - negative per pt  2022: AP, lateral views of right knee reviewed.  Moderate degenerative changes  with joint space narrowing and subchondral sclerosis noted with marginal  osteophytes in the medial compartment primarily with some findings in the  lateral compartment as well.  No significant patellofemoral arthritis  noted.  No fracture or dislocation.   PATIENT SURVEYS:  LEFS 19 / 80 = 23.8 %  COGNITION: Overall cognitive status: Within functional limits for tasks assessed     SENSATION: [facility]  MUSCLE  LENGTH: HS: mild tightness Quads: mild tightness ITB: tight R Piriformis: mild R Hip Flexors: mild R Heelcords: mild R   POSTURE: No Significant postural limitations  PALPATION: Palpation: Marked TTP at R gastroc, HS, quads, ITB, R gluteals. Increased tone in post knee. Patellar Mobility: WNL bil     LOWER EXTREMITY ROM:  A/P ROM Right eval Left eval  Hip flexion [facility]  Hip extension    Hip abduction    Hip adduction    Hip internal rotation    Hip external rotation    Knee flexion  103/116* 125  Knee extension -5/-2 full   (Blank rows = not tested) * pain  LOWER EXTREMITY MMT:  MMT Right eval Left eval  Hip flexion 4   Hip extension 5   Hip abduction 5   Hip adduction    Hip internal rotation    Hip external rotation    Knee flexion 5 mild pain   Knee extension 5   Ankle dorsiflexion 5   Ankle plantarflexion    Ankle inversion    Ankle eversion     (Blank rows = not tested)   FUNCTIONAL TESTS:  Difficulty with sit to stand transfer due to knee pain  GAIT: Distance walked:  40 Assistive device utilized: Single point cane Level of assistance: Modified independence Comments: antalgic, uses cane in R UE; cane adjusted to correct height  06/05/22 Low back assessed:  Full functional ROM, pain with UPA and CPA mobs to bil L3/4, L4/5, L5/S1 R >L - Pain decreases with repeated mobs. Marked tightness in bil gluteals, piriformis and lumbar paraspinals  R>L. Patient limited with prone press ups due to arm weakness.    TODAY'S TREATMENT:                                                                                                                              DATE:    06/07/22 Nustep L5x85mn Seated LAQ x 10 Bil Bridges x 10  Supine clams RTB 2x10 Supine march 2x10 RTB Passive hamstring stretch 3x15 sec Standing hip abduction x 10 bil Standing hip extension x 10 bil  06/05/22 Nustep L5 x 5 min Self MFR to R gluteals and lumbar using midsize ball.  Back assessed: see Objective   Manual Therapy: to decrease muscle spasm and pain and improve mobility Skilled palpation and monitoring of soft tissues during DN STM to R gluteals, piriformis, bil lumbar multifidi Trigger Point Dry-Needling  Treatment instructions: Expect mild to moderate muscle soreness. S/S of pneumothorax if dry needled over a lung field, and to seek immediate medical attention should they occur. Patient verbalized understanding of these instructions and education. Patient Consent  Given: Yes Education handout provided: Yes Muscles treated: R gluteals, piriformis, bil lumbar multifidi Electrical stimulation performed: No Parameters: N/A Treatment response/outcome: Twitch Response Elicited and Palpable Increase in Muscle Length  05/30/22  There. Activity: DGI completed   Gait: 40 feet with SPC in  L hand - decreased heel strike R>L, decreased step length left  Manual Therapy: to decrease muscle spasm and pain and improve mobility Skilled palpation and monitoring of soft tissues during DN STM to R gastroc, HS and lateral quads Trigger Point Dry-Needling  Treatment instructions: Expect mild to moderate muscle soreness. S/S of pneumothorax if dry needled over a lung field, and to seek immediate medical attention should they occur. Patient verbalized understanding of these instructions and education. Patient Consent Given: Yes Education handout provided: Yes Muscles treated: R gastroc, popliteus, HS and lateral quads Electrical stimulation performed: No Parameters: N/A Treatment response/outcome: Twitch Response Elicited and Palpable Increase in Muscle Length   Modalities: MH to R quads/HS x 10 min  05/23/22 See Pt ED  PATIENT EDUCATION:  Education details: PT eval findings, anticipated POC, initial HEP, and role of DN Patient Person educated: Patient Education method: Explanation, Demonstration, Verbal cues, and Handouts Education comprehension: verbalized understanding and returned demonstration  HOME EXERCISE PROGRAM: Access Code: [email or web address] URL: [email or web address] Date: 05/23/2022 Prepared by: Almyra Free  Exercises - Long Sitting Calf Stretch with Strap  - 2 x daily - 7 x weekly - 1 sets - 3 reps - 30 sec hold - Modified Thomas Stretch  - 1 x daily - 7 x weekly - 1 sets - 3 reps - 30 sec hold - ITB Stretch at [employer] (Mirrored)  - 1 x daily - 7 x weekly - 1 sets - 3 reps - 30-60 hold  ASSESSMENT:  CLINICAL IMPRESSION: Melissa showed a good  response to treatment. Focused on proximal LE strength to improve stability for the R knee and low back. She has c/o pain mostly in the hamstring attachments to the knee. She noted decreased pain after the standing hip exercises.  OBJECTIVE IMPAIRMENTS: Abnormal gait, decreased balance, decreased ROM, decreased strength, increased fascial restrictions, increased muscle spasms, impaired flexibility, obesity, and pain.   ACTIVITY LIMITATIONS: standing, transfers, and locomotion level  PARTICIPATION LIMITATIONS: meal prep and community activity  PERSONAL FACTORS: Time since onset of injury/illness/exacerbation and 1 comorbidity: LBP  are also affecting patient's functional outcome.   REHAB POTENTIAL: Excellent  CLINICAL DECISION MAKING: Stable/uncomplicated  EVALUATION COMPLEXITY: Low   GOALS: Goals reviewed with patient? Yes  SHORT TERM GOALS: Target date: 06/06/2022 (Remove Blue Hyperlink)  Patient will be independent with initial HEP. Baseline:  Goal status: IN PROGRESS - not compliant  2.  Balance assesment completed Baseline:  Goal status: MET    LONG TERM GOALS: Target date: 07/04/2022 (Remove Blue Hyperlink)  Patient will be independent with advanced/ongoing HEP to improve outcomes and carryover.  Baseline:  Goal status: INITIAL  2.  Patient will report at least 75% improvement in R knee pain to improve QOL. Baseline:  Goal status: INITIAL  3.  Patient will demonstrate improved R knee AROM to >/= 2--120 deg to allow for normal gait and stair mechanics. Baseline:  Goal status: INITIAL  4.  Patient will demonstrate improved functional LE strength as demonstrated by being able to complete 5xSTS in <=14.8 sec. Baseline:  Goal status: INITIAL  5.  Patient will be able to ambulate 600' with LRAD and normal gait pattern without increased pain to access community.  Baseline:  Goal status: INITIAL  6. Patient will be stand to cook meals without increased R knee  pain. Baseline:  Goal status: INITIAL  7.  Patient will report 28/80 on LEFS  to demonstrate improved functional ability. Baseline: 19 / 80 = 23.8 % Goal status: INITIAL  8.  Patient will demonstrate at least 19/24 on DGI to decrease risk of falls. Baseline: TBD Goal status: INITIAL   9.  Patient will be able to sleep without waking from knee pain Baseline:  Goal status: INITIAL   PLAN:  PT FREQUENCY: 2x/week  PT DURATION: 6 weeks  PLANNED INTERVENTIONS: Therapeutic exercises, Therapeutic activity, Neuromuscular re-education, Balance training, Gait training, Patient/Family education, Self Care, Joint mobilization, Aquatic Therapy, Dry Needling, Electrical stimulation, Spinal mobilization, Cryotherapy, Moist heat, Taping, Ultrasound, Ionotophoresis 86m/ml Dexamethasone, and Manual therapy  PLAN FOR NEXT SESSION:  continue DN/MT to lumbar/glutes, and RLE prn, functional LE strength including steps, balance  BArtist Pais PTA 06/07/2022, 11:05 AM

## 2022-06-11 ENCOUNTER — Ambulatory Visit: Payer: Medicare Other | Admitting: Physical Therapy

## 2022-06-11 DIAGNOSIS — R2681 Unsteadiness on feet: Secondary | ICD-10-CM | POA: Diagnosis not present

## 2022-06-11 DIAGNOSIS — M25561 Pain in right knee: Secondary | ICD-10-CM | POA: Diagnosis not present

## 2022-06-11 DIAGNOSIS — G8929 Other chronic pain: Secondary | ICD-10-CM

## 2022-06-11 DIAGNOSIS — R252 Cramp and spasm: Secondary | ICD-10-CM | POA: Diagnosis not present

## 2022-06-11 DIAGNOSIS — M25661 Stiffness of right knee, not elsewhere classified: Secondary | ICD-10-CM | POA: Diagnosis not present

## 2022-06-11 NOTE — Therapy (Signed)
OUTPATIENT PHYSICAL THERAPY TREATMENT   Patient Name: Marie Hebert MRN: [id number] DOB:1932-10-19, 88 y.o., female Today's Date: 06/11/2022  END OF SESSION:  PT End of Session - 06/11/22 1024     Visit Number 5    Date for PT Re-Evaluation 07/04/22    Authorization Type U[employer] MCR    Progress Note Due on Visit 10    PT Start Time 1019    PT Stop Time 1103    PT Time Calculation (min) 44 min    Activity Tolerance Patient tolerated treatment well    Behavior During Therapy WFL for tasks assessed/performed                 Past Medical History:  Diagnosis Date   Back pain    Dizziness    Hypertension    Palpitations    Past Surgical History:  Procedure Laterality Date   CATARACT EXTRACTION  2010   right   COLONOSCOPY  2003   LAPAROSCOPIC APPENDECTOMY N/A 09/25/2020   Procedure: APPENDECTOMY LAPAROSCOPIC;  Surgeon: Mickeal Skinner, MD;  Location: [place];  Service: General;  Laterality: N/A;   RETINAL LASER PROCEDURE     Patient Active Problem List   Diagnosis Date Noted   Early dry stage nonexudative age-related macular degeneration of both eyes 01/10/2022   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 01/10/2022   Status post surgery 09/25/2020   Essential hypertension 07/12/2017   PAC (premature atrial contraction) 07/12/2017   Mitral regurgitation 07/12/2017   Onychomycosis 08/10/2015   Pain in lower limb 08/10/2015   PCP NOTES>>> 04/06/2015   Skin lesion 03/18/2013   Annual physical exam 12/31/2011   DJD (degenerative joint disease) 12/31/2011   Vitamin D deficiency 12/31/2011   Recurrent UTI 10/10/2011   PALPITATIONS, OCCASIONAL 10/14/2007   BACK PAIN 09/16/2007    PCP: Colon Branch, MD   REFERRING PROVIDER: Colon Branch, MD   REFERRING DIAG: [phone] (ICD-10-CM) - Right knee pain, unspecified chronicity  THERAPY DIAG:  Chronic pain of right knee  Stiffness of right knee, not elsewhere classified  Cramp and  spasm  Unsteadiness on feet  Rationale for Evaluation and Treatment: Rehabilitation  ONSET DATE: March/April 2023  SUBJECTIVE:   SUBJECTIVE STATEMENT: Thinks some of her pain is sciatic.  Pain is mostly at night.   Has to keep leg bent, sometimes can't sleep.    PERTINENT HISTORY:  LBP  PAIN:  Are you having pain? Yes: NPRS scale: 5/10 Pain location: behind the R knee Pain description: sometimes dull sometimes sharp Aggravating factors: sleeping, standing  Relieving factors: sitting  PRECAUTIONS: None  WEIGHT BEARING RESTRICTIONS: No  FALLS:  Has patient fallen in last 6 months? No  LIVING ENVIRONMENT: Lives with: lives alone Lives in: House/apartment Stairs: No Has following equipment at home: Single point cane  OCCUPATION: retired  PLOF: Independent  PATIENT GOALS: Get rid of her knee pain and know what is wrong.  NEXT MD VISIT:   OBJECTIVE:   DIAGNOSTIC FINDINGS:  [place] at Dr. Delilah Shan - negative per pt  2022: AP, lateral views of right knee reviewed.  Moderate degenerative changes  with joint space narrowing and subchondral sclerosis noted with marginal  osteophytes in the medial compartment primarily with some findings in the  lateral compartment as well.  No significant patellofemoral arthritis  noted.  No fracture or dislocation.   PATIENT SURVEYS:  LEFS 19 / 80 = 23.8 %  COGNITION: Overall cognitive status: Within functional limits  for tasks assessed     SENSATION: WFL  MUSCLE LENGTH: HS: mild tightness Quads: mild tightness ITB: tight R Piriformis: mild R Hip Flexors: mild R Heelcords: mild R   POSTURE: No Significant postural limitations  PALPATION: Palpation: Marked TTP at R gastroc, HS, quads, ITB, R gluteals. Increased tone in post knee. Patellar Mobility: WNL bil     LOWER EXTREMITY ROM:  A/P ROM Right eval Left eval  Hip flexion [facility]  Hip extension    Hip abduction    Hip adduction    Hip internal rotation     Hip external rotation    Knee flexion 103/116* 125  Knee extension -5/-2 full   (Blank rows = not tested) * pain  LOWER EXTREMITY MMT:  MMT Right eval Left eval  Hip flexion 4   Hip extension 5   Hip abduction 5   Hip adduction    Hip internal rotation    Hip external rotation    Knee flexion 5 mild pain   Knee extension 5   Ankle dorsiflexion 5   Ankle plantarflexion    Ankle inversion    Ankle eversion     (Blank rows = not tested)   FUNCTIONAL TESTS:  Difficulty with sit to stand transfer due to knee pain  GAIT: Distance walked:  40 Assistive device utilized: Single point cane Level of assistance: Modified independence Comments: antalgic, uses cane in R UE; cane adjusted to correct height  06/05/22 Low back assessed:  Full functional ROM, pain with UPA and CPA mobs to bil L3/4, L4/5, L5/S1 R >L - Pain decreases with repeated mobs. Marked tightness in bil gluteals, piriformis and lumbar paraspinals  R>L. Patient limited with prone press ups due to arm weakness.    TODAY'S TREATMENT:                                                                                                                              DATE:   06/11/2022  Therapeutic Exercise: to improve strength and mobility.  Demo, verbal and tactile cues throughout for technique. Seated LAQ x 5 R LTR in supine  Bridges x 10  SLR with quad set x 10 bil  Prone knee bends R - increased pain Prone hip extension x 10 bil  Manual Therapy: to decrease muscle spasm and pain and improve mobility IASTM with foam roller to R lumbar paraspinals, QL, glutes, hamstrings, and calf.  UPA mobs to R lumbar spine grade 2-3, skilled palpation and monitoring during dry needling. Trigger Point Dry-Needling  Treatment instructions: Expect mild to moderate muscle soreness. S/S of pneumothorax if dry needled over a lung field, and to seek immediate medical attention should they occur. Patient verbalized understanding of these  instructions and education. Patient Consent Given: Yes Education handout provided: Previously provided Muscles treated: R lumbar multifidi L3-5, R glut med, R piriformis, R lateral hamstring  Treatment response/outcome: Twitch Response Elicited and Palpable Increase in Muscle  Length  06/07/22 Nustep L5x51mn Seated LAQ x 10 Bil Bridges x 10  Supine clams RTB 2x10 Supine march 2x10 RTB Passive hamstring stretch 3x15 sec Standing hip abduction x 10 bil Standing hip extension x 10 bil  06/05/22 Nustep L5 x 5 min Self MFR to R gluteals and lumbar using midsize ball.  Back assessed: see Objective   Manual Therapy: to decrease muscle spasm and pain and improve mobility Skilled palpation and monitoring of soft tissues during DN STM to R gluteals, piriformis, bil lumbar multifidi Trigger Point Dry-Needling  Treatment instructions: Expect mild to moderate muscle soreness. S/S of pneumothorax if dry needled over a lung field, and to seek immediate medical attention should they occur. Patient verbalized understanding of these instructions and education. Patient Consent Given: Yes Education handout provided: Yes Muscles treated: R gluteals, piriformis, bil lumbar multifidi Electrical stimulation performed: No Parameters: N/A Treatment response/outcome: Twitch Response Elicited and Palpable Increase in Muscle Length   PATIENT EDUCATION:  Education details: HEP update and review  Patient Person educated: Patient Education method: Explanation, Demonstration, Verbal cues, and Handouts Education comprehension: verbalized understanding and returned demonstration  HOME EXERCISE PROGRAM: Access Code: [email or web address]: [email or web address] Date: 06/11/2022 Prepared by: EGlenetta Hew Exercises - Long Sitting Calf Stretch with Strap  - 2 x daily - 7 x weekly - 1 sets - 3 reps - 30 sec hold - Modified Thomas Stretch  - 1 x daily - 7 x weekly - 1 sets - 3 reps - 30 sec hold -  ITB Stretch at [employer](Mirrored)  - 1 x daily - 7 x weekly - 1 sets - 3 reps - 30-60 hold - Standing Hip Extension with Counter Support  - 1 x daily - 3-4 x weekly - 2 sets - 10 reps - Standing Hip Abduction with Counter Support  - 1 x daily - 3-4 x weekly - 2 sets - 10 reps - Beginner Prone Single Leg Raise  - 1 x daily - 7 x weekly - 1-2 sets - 10 reps  ASSESSMENT:  CLINICAL IMPRESSION: Melissa reports continued pain in R side low back, glutes, down to R calf, also has tenderness over R LCL.  Today reviewed exercises, reported increased pain with R prone knee bends, but tolerated prone leg extension well so added to HEP.  Reported decrease pain following manual therapy.  Myiah E Giesler continues to demonstrate potential for improvement and would benefit from continued skilled therapy to address impairments.     OBJECTIVE IMPAIRMENTS: Abnormal gait, decreased balance, decreased ROM, decreased strength, increased fascial restrictions, increased muscle spasms, impaired flexibility, obesity, and pain.   ACTIVITY LIMITATIONS: standing, transfers, and locomotion level  PARTICIPATION LIMITATIONS: meal prep and community activity  PERSONAL FACTORS: Time since onset of injury/illness/exacerbation and 1 comorbidity: LBP  are also affecting patient's functional outcome.   REHAB POTENTIAL: Excellent  CLINICAL DECISION MAKING: Stable/uncomplicated  EVALUATION COMPLEXITY: Low   GOALS: Goals reviewed with patient? Yes  SHORT TERM GOALS: Target date: 06/06/2022   Patient will be independent with initial HEP. Baseline:  Goal status: IN PROGRESS - not compliant  2.  Balance assesment completed Baseline:  Goal status: MET    LONG TERM GOALS: Target date: 07/04/2022 )  Patient will be independent with advanced/ongoing HEP to improve outcomes and carryover.  Baseline:  Goal status: IN PROGRESS  2.  Patient will report at least 75% improvement in R knee pain to improve QOL. Baseline:   Goal status: IN PROGRESS  3.  Patient will demonstrate improved R knee AROM to >/= 2--120 deg to allow for normal gait and stair mechanics. Baseline:  Goal status: IN PROGRESS  4.  Patient will demonstrate improved functional LE strength as demonstrated by being able to complete 5xSTS in <=14.8 sec. Baseline:  Goal status: IN PROGRESS  5.  Patient will be able to ambulate 600' with LRAD and normal gait pattern without increased pain to access community.  Baseline:  Goal status: IN PROGRESS  6. Patient will be stand to cook meals without increased R knee pain. Baseline:  Goal status: IN PROGRESS  7.  Patient will report 28/80 on LEFS  to demonstrate improved functional ability. Baseline: 19 / 80 = 23.8 % Goal status: IN PROGRESS  8.  Patient will demonstrate at least 19/24 on DGI to decrease risk of falls. Baseline: TBD Goal status: IN PROGRESS   9.  Patient will be able to sleep without waking from knee pain Baseline:  Goal status: IN PROGRESS   PLAN:  PT FREQUENCY: 2x/week  PT DURATION: 6 weeks  PLANNED INTERVENTIONS: Therapeutic exercises, Therapeutic activity, Neuromuscular re-education, Balance training, Gait training, Patient/Family education, Self Care, Joint mobilization, Aquatic Therapy, Dry Needling, Electrical stimulation, Spinal mobilization, Cryotherapy, Moist heat, Taping, Ultrasound, Ionotophoresis 71m/ml Dexamethasone, and Manual therapy  PLAN FOR NEXT SESSION:  continue DN/MT to lumbar/glutes, and RLE prn, functional LE strength including steps, balance  ERennie Natter PT, DPT 06/11/2022, 12:18 PM

## 2022-06-28 ENCOUNTER — Ambulatory Visit: Payer: 59 | Attending: Internal Medicine

## 2022-06-28 DIAGNOSIS — R2681 Unsteadiness on feet: Secondary | ICD-10-CM | POA: Insufficient documentation

## 2022-06-28 DIAGNOSIS — M25561 Pain in right knee: Secondary | ICD-10-CM | POA: Diagnosis not present

## 2022-06-28 DIAGNOSIS — M25661 Stiffness of right knee, not elsewhere classified: Secondary | ICD-10-CM | POA: Insufficient documentation

## 2022-06-28 DIAGNOSIS — M5459 Other low back pain: Secondary | ICD-10-CM | POA: Diagnosis not present

## 2022-06-28 DIAGNOSIS — G8929 Other chronic pain: Secondary | ICD-10-CM | POA: Insufficient documentation

## 2022-06-28 DIAGNOSIS — R252 Cramp and spasm: Secondary | ICD-10-CM | POA: Insufficient documentation

## 2022-06-28 NOTE — Therapy (Signed)
OUTPATIENT PHYSICAL THERAPY TREATMENT   Patient Name: Marie Hebert MRN: [id number] DOB:13-Feb-1933, 89 y.o., female Today's Date: 06/28/2022  END OF SESSION:  PT End of Session - 06/28/22 1437     Visit Number 6    Date for PT Re-Evaluation 07/04/22    Authorization Type [employer] MCR    Progress Note Due on Visit 10    PT Start Time 1403    PT Stop Time 1446    PT Time Calculation (min) 43 min    Activity Tolerance Patient tolerated treatment well    Behavior During Therapy WFL for tasks assessed/performed                 Past Medical History:  Diagnosis Date   Back pain    Dizziness    Hypertension    Palpitations    Past Surgical History:  Procedure Laterality Date   CATARACT EXTRACTION  2010   right   COLONOSCOPY  2003   LAPAROSCOPIC APPENDECTOMY N/A 09/25/2020   Procedure: APPENDECTOMY LAPAROSCOPIC;  Surgeon: Mickeal Skinner, MD;  Location: [facility];  Service: General;  Laterality: N/A;   RETINAL LASER PROCEDURE     Patient Active Problem List   Diagnosis Date Noted   Early dry stage nonexudative age-related macular degeneration of both eyes 01/10/2022   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 01/10/2022   Status post surgery 09/25/2020   Essential hypertension 07/12/2017   PAC (premature atrial contraction) 07/12/2017   Mitral regurgitation 07/12/2017   Onychomycosis 08/10/2015   Pain in lower limb 08/10/2015   PCP NOTES>>> 04/06/2015   Skin lesion 03/18/2013   Annual physical exam 12/31/2011   DJD (degenerative joint disease) 12/31/2011   Vitamin D deficiency 12/31/2011   Recurrent UTI 10/10/2011   PALPITATIONS, OCCASIONAL 10/14/2007   BACK PAIN 09/16/2007    PCP: Colon Branch, MD   REFERRING PROVIDER: Colon Branch, MD   REFERRING DIAG: [id number] (ICD-10-CM) - Right knee pain, unspecified chronicity  THERAPY DIAG:  Chronic pain of right knee  Stiffness of right knee, not elsewhere classified  Cramp and  spasm  Unsteadiness on feet  Rationale for Evaluation and Treatment: Rehabilitation  ONSET DATE: March/April 2023  SUBJECTIVE:   SUBJECTIVE STATEMENT: 'Pain is sometimes 5/10, sometimes 6/10. The DN helps a lot with pain.  PERTINENT HISTORY:  LBP  PAIN:  Are you having pain? Yes: NPRS scale: 5/10 Pain location: behind the R knee Pain description: sometimes dull sometimes sharp Aggravating factors: sleeping, standing  Relieving factors: sitting  PRECAUTIONS: None  WEIGHT BEARING RESTRICTIONS: No  FALLS:  Has patient fallen in last 6 months? No  LIVING ENVIRONMENT: Lives with: lives alone Lives in: House/apartment Stairs: No Has following equipment at home: Single point cane  OCCUPATION: retired  PLOF: Independent  PATIENT GOALS: Get rid of her knee pain and know what is wrong.  NEXT MD VISIT:   OBJECTIVE:   DIAGNOSTIC FINDINGS:  [place] at Dr. Delilah Shan - negative per pt  2022: AP, lateral views of right knee reviewed.  Moderate degenerative changes  with joint space narrowing and subchondral sclerosis noted with marginal  osteophytes in the medial compartment primarily with some findings in the  lateral compartment as well.  No significant patellofemoral arthritis  noted.  No fracture or dislocation.   PATIENT SURVEYS:  LEFS 19 / 80 = 23.8 %  COGNITION: Overall cognitive status: Within functional limits for tasks assessed     SENSATION: WFL  MUSCLE LENGTH:  HS: mild tightness Quads: mild tightness ITB: tight R Piriformis: mild R Hip Flexors: mild R Heelcords: mild R   POSTURE: No Significant postural limitations  PALPATION: Palpation: Marked TTP at R gastroc, HS, quads, ITB, R gluteals. Increased tone in post knee. Patellar Mobility: WNL bil     LOWER EXTREMITY ROM:  A/P ROM Right eval Left eval  Hip flexion [facility]  Hip extension    Hip abduction    Hip adduction    Hip internal rotation    Hip external rotation    Knee flexion  103/116* 125  Knee extension -5/-2 full   (Blank rows = not tested) * pain  LOWER EXTREMITY MMT:  MMT Right eval Left eval  Hip flexion 4   Hip extension 5   Hip abduction 5   Hip adduction    Hip internal rotation    Hip external rotation    Knee flexion 5 mild pain   Knee extension 5   Ankle dorsiflexion 5   Ankle plantarflexion    Ankle inversion    Ankle eversion     (Blank rows = not tested)   FUNCTIONAL TESTS:  Difficulty with sit to stand transfer due to knee pain  GAIT: Distance walked:  40 Assistive device utilized: Single point cane Level of assistance: Modified independence Comments: antalgic, uses cane in R UE; cane adjusted to correct height  06/05/22 Low back assessed:  Full functional ROM, pain with UPA and CPA mobs to bil L3/4, L4/5, L5/S1 R >L - Pain decreases with repeated mobs. Marked tightness in bil gluteals, piriformis and lumbar paraspinals  R>L. Patient limited with prone press ups due to arm weakness.    TODAY'S TREATMENT:                                                                                                                              DATE:    06/28/22 Therapeutic Exercise: to improve strength and mobility.  Demo, verbal and tactile cues throughout for technique. Nustep L5x31mn Fwd step ups x 10 bil 4' Lat step ups x 10 bil 4' Knee flexion 20# 2x10 BLE Knee extension 15# 2x10 BLE Standing hip abduction and extension reviewed  Manual Therapy: to decrease muscle spasm and pain and improve mobility (performed by ERennie Natter PT, DPT) STM/TPR to R hamstrings and gastrocs, skilled palpation and monitoring during dry needling. Trigger Point Dry-Needling  Treatment instructions: Expect mild to moderate muscle soreness. S/S of pneumothorax if dry needled over a lung field, and to seek immediate medical attention should they occur. Patient verbalized understanding of these instructions and education. Patient Consent Given:  Yes Education handout provided: Previously provided Muscles treated: R lateral hamstrings (distal), R lateral gastroc Electrical stimulation performed: No Parameters: N/A Treatment response/outcome: Twitch Response Elicited and Palpable Increase in Muscle Length    06/11/2022  Therapeutic Exercise: to improve strength and mobility.  Demo, verbal and tactile cues throughout for technique. Seated  LAQ x 5 R LTR in supine  Bridges x 10  SLR with quad set x 10 bil  Prone knee bends R - increased pain Prone hip extension x 10 bil  Manual Therapy: to decrease muscle spasm and pain and improve mobility IASTM with foam roller to R lumbar paraspinals, QL, glutes, hamstrings, and calf.  UPA mobs to R lumbar spine grade 2-3, skilled palpation and monitoring during dry needling. Trigger Point Dry-Needling  Treatment instructions: Expect mild to moderate muscle soreness. S/S of pneumothorax if dry needled over a lung field, and to seek immediate medical attention should they occur. Patient verbalized understanding of these instructions and education. Patient Consent Given: Yes Education handout provided: Previously provided Muscles treated: R lumbar multifidi L3-5, R glut med, R piriformis, R lateral hamstring  Treatment response/outcome: Twitch Response Elicited and Palpable Increase in Muscle Length  06/07/22 Nustep L5x24mn Seated LAQ x 10 Bil Bridges x 10  Supine clams RTB 2x10 Supine march 2x10 RTB Passive hamstring stretch 3x15 sec Standing hip abduction x 10 bil Standing hip extension x 10 bil  06/05/22 Nustep L5 x 5 min Self MFR to R gluteals and lumbar using midsize ball.  Back assessed: see Objective   Manual Therapy: to decrease muscle spasm and pain and improve mobility Skilled palpation and monitoring of soft tissues during DN STM to R gluteals, piriformis, bil lumbar multifidi Trigger Point Dry-Needling  Treatment instructions: Expect mild to moderate muscle soreness. S/S  of pneumothorax if dry needled over a lung field, and to seek immediate medical attention should they occur. Patient verbalized understanding of these instructions and education. Patient Consent Given: Yes Education handout provided: Yes Muscles treated: R gluteals, piriformis, bil lumbar multifidi Electrical stimulation performed: No Parameters: N/A Treatment response/outcome: Twitch Response Elicited and Palpable Increase in Muscle Length   PATIENT EDUCATION:  Education details: HEP update and review  Patient Person educated: Patient Education method: Explanation, Demonstration, Verbal cues, and Handouts Education comprehension: verbalized understanding and returned demonstration  HOME EXERCISE PROGRAM: Access Code: [email or web address]: [email or web address] Date: 06/11/2022 Prepared by: EGlenetta Hew Exercises - Long Sitting Calf Stretch with Strap  - 2 x daily - 7 x weekly - 1 sets - 3 reps - 30 sec hold - Modified Thomas Stretch  - 1 x daily - 7 x weekly - 1 sets - 3 reps - 30 sec hold - ITB Stretch at [employer])  - 1 x daily - 7 x weekly - 1 sets - 3 reps - 30-60 hold - Standing Hip Extension with Counter Support  - 1 x daily - 3-4 x weekly - 2 sets - 10 reps - Standing Hip Abduction with Counter Support  - 1 x daily - 3-4 x weekly - 2 sets - 10 reps - Beginner Prone Single Leg Raise  - 1 x daily - 7 x weekly - 1-2 sets - 10 reps  ASSESSMENT:  CLINICAL IMPRESSION: Skilled interventions with PTA  focused on knee and hip strengthening to improve stability. She had mild pain with step ups but tolerable. Tried mini squats at counter but she had increased knee pain so we discontinued. Progressed HEP standing hip abduction and extension to YTB.   Patient reported that the dry needling helped significantly with low back pain last session but has been having pain behind her Right knee, noted with palpation trigger points, tender points in lateral hamstring and  gastroc, after manual therapy and TrDN reported significant relief.  Ariel E Dziuba continues to  demonstrate potential for improvement and would benefit from continued skilled therapy to address impairments.      OBJECTIVE IMPAIRMENTS: Abnormal gait, decreased balance, decreased ROM, decreased strength, increased fascial restrictions, increased muscle spasms, impaired flexibility, obesity, and pain.   ACTIVITY LIMITATIONS: standing, transfers, and locomotion level  PARTICIPATION LIMITATIONS: meal prep and community activity  PERSONAL FACTORS: Time since onset of injury/illness/exacerbation and 1 comorbidity: LBP  are also affecting patient's functional outcome.   REHAB POTENTIAL: Excellent  CLINICAL DECISION MAKING: Stable/uncomplicated  EVALUATION COMPLEXITY: Low   GOALS: Goals reviewed with patient? Yes  SHORT TERM GOALS: Target date: 06/06/2022   Patient will be independent with initial HEP. Baseline:  Goal status: IN PROGRESS - not compliant  2.  Balance assesment completed Baseline:  Goal status: MET    LONG TERM GOALS: Target date: 07/04/2022 )  Patient will be independent with advanced/ongoing HEP to improve outcomes and carryover.  Baseline:  Goal status: IN PROGRESS  2.  Patient will report at least 75% improvement in R knee pain to improve QOL. Baseline:  Goal status: IN PROGRESS  3.  Patient will demonstrate improved R knee AROM to >/= 2--120 deg to allow for normal gait and stair mechanics. Baseline:  Goal status: IN PROGRESS  4.  Patient will demonstrate improved functional LE strength as demonstrated by being able to complete 5xSTS in <=14.8 sec. Baseline:  Goal status: IN PROGRESS  5.  Patient will be able to ambulate 600' with LRAD and normal gait pattern without increased pain to access community.  Baseline:  Goal status: IN PROGRESS  6. Patient will be stand to cook meals without increased R knee pain. Baseline:  Goal status: IN  PROGRESS  7.  Patient will report 28/80 on LEFS  to demonstrate improved functional ability. Baseline: 19 / 80 = 23.8 % Goal status: IN PROGRESS  8.  Patient will demonstrate at least 19/24 on DGI to decrease risk of falls. Baseline: TBD Goal status: IN PROGRESS   9.  Patient will be able to sleep without waking from knee pain Baseline:  Goal status: IN PROGRESS   PLAN:  PT FREQUENCY: 2x/week  PT DURATION: 6 weeks  PLANNED INTERVENTIONS: Therapeutic exercises, Therapeutic activity, Neuromuscular re-education, Balance training, Gait training, Patient/Family education, Self Care, Joint mobilization, Aquatic Therapy, Dry Needling, Electrical stimulation, Spinal mobilization, Cryotherapy, Moist heat, Taping, Ultrasound, Ionotophoresis 72m/ml Dexamethasone, and Manual therapy  PLAN FOR NEXT SESSION:  continue DN/MT to lumbar/glutes, and RLE prn, functional LE strength including steps, balance  ERennie Natter PT, DPT  06/28/2022, 3:14 PM  BArtist Pais PTA 06/11/2022, 3:14 PM

## 2022-06-29 ENCOUNTER — Telehealth: Payer: Self-pay | Admitting: Internal Medicine

## 2022-06-29 NOTE — Telephone Encounter (Signed)
Spoke w/ Pt- informed of PCP thoughts/recommendations.

## 2022-06-29 NOTE — Telephone Encounter (Signed)
Please advise 

## 2022-06-29 NOTE — Telephone Encounter (Signed)
Do not see a  contraindication for acupuncture.

## 2022-06-29 NOTE — Telephone Encounter (Signed)
Pt stated she was advised by the PTA to ask her pcp if they can do some type of shoulder acupuncture, she stated it is not acupuncture but it is a similar concept. Please advise.

## 2022-07-03 ENCOUNTER — Encounter: Payer: Self-pay | Admitting: Physical Therapy

## 2022-07-03 ENCOUNTER — Ambulatory Visit: Payer: 59 | Admitting: Physical Therapy

## 2022-07-03 DIAGNOSIS — M5459 Other low back pain: Secondary | ICD-10-CM | POA: Diagnosis not present

## 2022-07-03 DIAGNOSIS — R252 Cramp and spasm: Secondary | ICD-10-CM | POA: Diagnosis not present

## 2022-07-03 DIAGNOSIS — M25561 Pain in right knee: Secondary | ICD-10-CM | POA: Diagnosis not present

## 2022-07-03 DIAGNOSIS — R2681 Unsteadiness on feet: Secondary | ICD-10-CM

## 2022-07-03 DIAGNOSIS — M25661 Stiffness of right knee, not elsewhere classified: Secondary | ICD-10-CM | POA: Diagnosis not present

## 2022-07-03 DIAGNOSIS — G8929 Other chronic pain: Secondary | ICD-10-CM

## 2022-07-03 NOTE — Therapy (Signed)
OUTPATIENT PHYSICAL THERAPY TREATMENT Progress Note Reporting Period 05/23/2022 to 07/03/2022  See note below for Objective Data and Assessment of Progress/Goals.      Patient Name: Marie Hebert MRN: [id number] DOB:[birth year], 89 y.o., female Today's Date: 07/03/2022  END OF SESSION:  PT End of Session - 07/03/22 1022     Visit Number 7    Date for PT Re-Evaluation 08/14/22    Authorization Type [employer] MCR    Progress Note Due on Visit 17    PT Start Time 1019    PT Stop Time 1100    PT Time Calculation (min) 41 min    Activity Tolerance Patient tolerated treatment well    Behavior During Therapy WFL for tasks assessed/performed                 Past Medical History:  Diagnosis Date   Back pain    Dizziness    Hypertension    Palpitations    Past Surgical History:  Procedure Laterality Date   CATARACT EXTRACTION  2010   right   COLONOSCOPY  2003   LAPAROSCOPIC APPENDECTOMY N/A 09/25/2020   Procedure: APPENDECTOMY LAPAROSCOPIC;  Surgeon: Mickeal Skinner, MD;  Location: [facility];  Service: General;  Laterality: N/A;   RETINAL LASER PROCEDURE     Patient Active Problem List   Diagnosis Date Noted   Early dry stage nonexudative age-related macular degeneration of both eyes 01/10/2022   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 01/10/2022   Status post surgery 09/25/2020   Essential hypertension 07/12/2017   PAC (premature atrial contraction) 07/12/2017   Mitral regurgitation 07/12/2017   Onychomycosis 08/10/2015   Pain in lower limb 08/10/2015   PCP NOTES>>> 04/06/2015   Skin lesion 03/18/2013   Annual physical exam 12/31/2011   DJD (degenerative joint disease) 12/31/2011   Vitamin D deficiency 12/31/2011   Recurrent UTI 10/10/2011   PALPITATIONS, OCCASIONAL 10/14/2007   BACK PAIN 09/16/2007    PCP: Colon Branch, MD   REFERRING PROVIDER: Colon Branch, MD   REFERRING DIAG: [phone] (ICD-10-CM) - Right knee pain, unspecified  chronicity  THERAPY DIAG:  Chronic pain of right knee  Stiffness of right knee, not elsewhere classified  Cramp and spasm  Unsteadiness on feet  Other low back pain  Rationale for Evaluation and Treatment: Rehabilitation  ONSET DATE: March/April 2023  SUBJECTIVE:   SUBJECTIVE STATEMENT: Patient reports after dry needling "the second day hurts, the 3rd day is almost good".  She's a little confused about how to get treatment for her shoulder pain.    PERTINENT HISTORY:  LBP  PAIN:  Are you having pain? Yes: NPRS scale: 5/10 Pain location: behind the R knee Pain description: sometimes dull sometimes sharp Aggravating factors: sleeping, standing  Relieving factors: sitting  PRECAUTIONS: None  WEIGHT BEARING RESTRICTIONS: No  FALLS:  Has patient fallen in last 6 months? No  LIVING ENVIRONMENT: Lives with: lives alone Lives in: House/apartment Stairs: No Has following equipment at home: Single point cane  OCCUPATION: retired  PLOF: Independent  PATIENT GOALS: Get rid of her knee pain and know what is wrong.  NEXT MD VISIT:   OBJECTIVE:   DIAGNOSTIC FINDINGS:  [place] at Dr. Delilah Shan - negative per pt  2022: AP, lateral views of right knee reviewed.  Moderate degenerative changes  with joint space narrowing and subchondral sclerosis noted with marginal  osteophytes in the medial compartment primarily with some findings in the  lateral compartment as well.  No significant patellofemoral arthritis  noted.  No fracture or dislocation.   PATIENT SURVEYS:  LEFS 19 / 80 = 23.8 %  COGNITION: Overall cognitive status: Within functional limits for tasks assessed     SENSATION: WFL  MUSCLE LENGTH: HS: mild tightness Quads: mild tightness ITB: tight R Piriformis: mild R Hip Flexors: mild R Heelcords: mild R   POSTURE: No Significant postural limitations  PALPATION: Palpation: Marked TTP at R gastroc, HS, quads, ITB, R gluteals. Increased tone in post  knee. Patellar Mobility: WNL bil     LOWER EXTREMITY ROM:  A/P ROM Right eval Left eval Right  AROM 07/03/2022  Hip flexion [facility]   Knee flexion 103/116* 125 105  Knee extension -5/-2 full -5   (Blank rows = not tested) * pain  LOWER EXTREMITY MMT:  MMT Right eval Left eval  Hip flexion 4   Hip extension 5   Hip abduction 5   Hip adduction    Hip internal rotation    Hip external rotation    Knee flexion 5 mild pain   Knee extension 5   Ankle dorsiflexion 5   Ankle plantarflexion    Ankle inversion    Ankle eversion     (Blank rows = not tested)   FUNCTIONAL TESTS:  Difficulty with sit to stand transfer due to knee pain  GAIT: Distance walked:  40 Assistive device utilized: Single point cane Level of assistance: Modified independence Comments: antalgic, uses cane in R UE; cane adjusted to correct height  06/05/22 Low back assessed:  Full functional ROM, pain with UPA and CPA mobs to bil L3/4, L4/5, L5/S1 R >L - Pain decreases with repeated mobs. Marked tightness in bil gluteals, piriformis and lumbar paraspinals  R>L. Patient limited with prone press ups due to arm weakness.    TODAY'S TREATMENT:                                                                                                                              DATE:    07/03/2022 Therapeutic Exercise: to improve strength and mobility.  Demo, verbal and tactile cues throughout for technique. Nustep L5 x 6 min  Review of HEP - discontinue prone hip extension Active straight leg raise 2 x 10 bil  Therapeutic Activity:  to assess progress towards goals.  MMT, ROM, 5x STS, gait x 600' Modalities: [place] x 8 min 1MHz, 1.2 w/cm2 to R LCL to decrease pain and inflammation   06/28/22 Therapeutic Exercise: to improve strength and mobility.  Demo, verbal and tactile cues throughout for technique. Nustep L5x26mn Fwd step ups x 10 bil 4' Lat step ups x 10 bil 4' Knee flexion 20# 2x10 BLE Knee extension 15# 2x10  BLE Standing hip abduction and extension reviewed  Manual Therapy: to decrease muscle spasm and pain and improve mobility (performed by ERennie Natter PT, DPT) STM/TPR to R hamstrings and gastrocs, skilled palpation and monitoring during dry  needling. Trigger Point Dry-Needling  Treatment instructions: Expect mild to moderate muscle soreness. S/S of pneumothorax if dry needled over a lung field, and to seek immediate medical attention should they occur. Patient verbalized understanding of these instructions and education. Patient Consent Given: Yes Education handout provided: Previously provided Muscles treated: R lateral hamstrings (distal), R lateral gastroc Electrical stimulation performed: No Parameters: N/A Treatment response/outcome: Twitch Response Elicited and Palpable Increase in Muscle Length    06/11/2022  Therapeutic Exercise: to improve strength and mobility.  Demo, verbal and tactile cues throughout for technique. Seated LAQ x 5 R LTR in supine  Bridges x 10  SLR with quad set x 10 bil  Prone knee bends R - increased pain Prone hip extension x 10 bil  Manual Therapy: to decrease muscle spasm and pain and improve mobility IASTM with foam roller to R lumbar paraspinals, QL, glutes, hamstrings, and calf.  UPA mobs to R lumbar spine grade 2-3, skilled palpation and monitoring during dry needling. Trigger Point Dry-Needling  Treatment instructions: Expect mild to moderate muscle soreness. S/S of pneumothorax if dry needled over a lung field, and to seek immediate medical attention should they occur. Patient verbalized understanding of these instructions and education. Patient Consent Given: Yes Education handout provided: Previously provided Muscles treated: R lumbar multifidi L3-5, R glut med, R piriformis, R lateral hamstring  Treatment response/outcome: Twitch Response Elicited and Palpable Increase in Muscle Length     PATIENT EDUCATION:  Education details: HEP  update and review  Patient Person educated: Patient Education method: Explanation, Demonstration, Verbal cues, and Handouts Education comprehension: verbalized understanding and returned demonstration  HOME EXERCISE PROGRAM: Access Code: [email or web address] URL: [email or web address] Date: 06/11/2022 Prepared by: Glenetta Hew  Exercises - Long Sitting Calf Stretch with Strap  - 2 x daily - 7 x weekly - 1 sets - 3 reps - 30 sec hold - Modified Thomas Stretch  - 1 x daily - 7 x weekly - 1 sets - 3 reps - 30 sec hold - ITB Stretch at [employer] (Mirrored)  - 1 x daily - 7 x weekly - 1 sets - 3 reps - 30-60 hold - Standing Hip Extension with Counter Support  - 1 x daily - 3-4 x weekly - 2 sets - 10 reps - Standing Hip Abduction with Counter Support  - 1 x daily - 3-4 x weekly - 2 sets - 10 reps - Beginner Prone Single Leg Raise  - 1 x daily - 7 x weekly - 1-2 sets - 10 reps - discontinued  ASSESSMENT:  CLINICAL IMPRESSION: Marie Hebert reports 50% improvement in R knee pain and improved tolerance to activity, reporting she was able to walk over 1000 steps this morning.  She demonstrates improved functional strength, meeting LTG #4 today, and she while she was able to walk 600', noted increased gait deviations after 450' due to increased knee pain.  Melissa would benefit from continued skilled therapy to continue to progress towards goals.   Will contact PCP about her shoulder.     OBJECTIVE IMPAIRMENTS: Abnormal gait, decreased balance, decreased ROM, decreased strength, increased fascial restrictions, increased muscle spasms, impaired flexibility, obesity, and pain.   ACTIVITY LIMITATIONS: standing, transfers, and locomotion level  PARTICIPATION LIMITATIONS: meal prep and community activity  PERSONAL FACTORS: Time since onset of injury/illness/exacerbation and 1 comorbidity: LBP  are also affecting patient's functional outcome.   REHAB POTENTIAL: Excellent  CLINICAL DECISION  MAKING: Stable/uncomplicated  EVALUATION COMPLEXITY: Low   GOALS: Goals reviewed  with patient? Yes  SHORT TERM GOALS: Target date: 06/06/2022   Patient will be independent with initial HEP. Baseline:  Goal status: MET  2.  Balance assesment completed Baseline:  Goal status: MET    LONG TERM GOALS: Target date: 07/04/2022 extended to 08/14/2022.   Patient will be independent with advanced/ongoing HEP to improve outcomes and carryover.  Baseline:  Goal status: IN PROGRESS 07/03/22- met for current, discontinued prone hip extension.   2.  Patient will report at least 75% improvement in R knee pain to improve QOL. Baseline:  Goal status: IN PROGRESS- 07/03/22- 50% improvement  3.  Patient will demonstrate improved R knee AROM to >/= 2--120 deg to allow for normal gait and stair mechanics. Baseline:  Goal status: IN PROGRESS 07/03/22 -see objective.   4.  Patient will demonstrate improved functional LE strength as demonstrated by being able to complete 5xSTS in <=14.8 sec. Baseline:  Goal status: MET 07/03/2022- 12.4 seconds, with increased knee pain.   5.  Patient will be able to ambulate 600' with LRAD and normal gait pattern without increased pain to access community.  Baseline:  Goal status: IN PROGRESS 07/03/22- noted increased gait deviation after 450'   6. Patient will be stand to cook meals without increased R knee pain. Baseline:  Goal status: IN PROGRESS  7.  Patient will report 28/80 on LEFS  to demonstrate improved functional ability. Baseline: 19 / 80 = 23.8 % Goal status: IN PROGRESS  8.  Patient will demonstrate at least 19/24 on DGI to decrease risk of falls. Baseline: TBD Goal status: IN PROGRESS   9.  Patient will be able to sleep without waking from knee pain Baseline:  Goal status: IN PROGRESS   PLAN:  PT FREQUENCY: 2x/week  PT DURATION: 6 weeks  PLANNED INTERVENTIONS: Therapeutic exercises, Therapeutic activity, Neuromuscular re-education, Balance  training, Gait training, Patient/Family education, Self Care, Joint mobilization, Aquatic Therapy, Dry Needling, Electrical stimulation, Spinal mobilization, Cryotherapy, Moist heat, Taping, Ultrasound, Ionotophoresis '4mg'$ /ml Dexamethasone, and Manual therapy  PLAN FOR NEXT SESSION:  continue DN/MT to lumbar/glutes, and RLE prn, functional LE strength including steps, balance.   Rennie Natter, PT, DPT  07/03/2022, 3:21 PM

## 2022-07-04 ENCOUNTER — Other Ambulatory Visit: Payer: Self-pay

## 2022-07-04 DIAGNOSIS — M25511 Pain in right shoulder: Secondary | ICD-10-CM

## 2022-07-18 ENCOUNTER — Ambulatory Visit: Payer: 59

## 2022-07-18 ENCOUNTER — Telehealth: Payer: Self-pay | Admitting: Internal Medicine

## 2022-07-18 DIAGNOSIS — M5459 Other low back pain: Secondary | ICD-10-CM

## 2022-07-18 DIAGNOSIS — M25561 Pain in right knee: Secondary | ICD-10-CM | POA: Diagnosis not present

## 2022-07-18 DIAGNOSIS — R252 Cramp and spasm: Secondary | ICD-10-CM | POA: Diagnosis not present

## 2022-07-18 DIAGNOSIS — G8929 Other chronic pain: Secondary | ICD-10-CM

## 2022-07-18 DIAGNOSIS — M25661 Stiffness of right knee, not elsewhere classified: Secondary | ICD-10-CM

## 2022-07-18 DIAGNOSIS — R2681 Unsteadiness on feet: Secondary | ICD-10-CM | POA: Diagnosis not present

## 2022-07-18 NOTE — Therapy (Addendum)
OUTPATIENT PHYSICAL THERAPY TREATMENT      Patient Name: Marie Hebert MRN: [id number] DOB:1932/09/24, 89 y.o., female Today's Date: 07/18/2022  END OF SESSION:  PT End of Session - 07/18/22 1438     Visit Number 8    Date for PT Re-Evaluation 08/14/22    Authorization Type [employer] MCR    Progress Note Due on Visit 17    PT Start Time 1400    PT Stop Time 1438    PT Time Calculation (min) 38 min    Activity Tolerance Patient tolerated treatment well    Behavior During Therapy WFL for tasks assessed/performed                 Past Medical History:  Diagnosis Date   Back pain    Dizziness    Hypertension    Palpitations    Past Surgical History:  Procedure Laterality Date   CATARACT EXTRACTION  2010   right   COLONOSCOPY  2003   LAPAROSCOPIC APPENDECTOMY N/A 09/25/2020   Procedure: APPENDECTOMY LAPAROSCOPIC;  Surgeon: Mickeal Skinner, MD;  Location: [place];  Service: General;  Laterality: N/A;   RETINAL LASER PROCEDURE     Patient Active Problem List   Diagnosis Date Noted   Early dry stage nonexudative age-related macular degeneration of both eyes 01/10/2022   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 01/10/2022   Status post surgery 09/25/2020   Essential hypertension 07/12/2017   PAC (premature atrial contraction) 07/12/2017   Mitral regurgitation 07/12/2017   Onychomycosis 08/10/2015   Pain in lower limb 08/10/2015   PCP NOTES>>> 04/06/2015   Skin lesion 03/18/2013   Annual physical exam 12/31/2011   DJD (degenerative joint disease) 12/31/2011   Vitamin D deficiency 12/31/2011   Recurrent UTI 10/10/2011   PALPITATIONS, OCCASIONAL 10/14/2007   BACK PAIN 09/16/2007    PCP: Colon Branch, MD   REFERRING PROVIDER: Colon Branch, MD   REFERRING DIAG: [phone] (ICD-10-CM) - Right knee pain, unspecified chronicity  THERAPY DIAG:  Chronic pain of right knee  Stiffness of right knee, not elsewhere classified  Cramp and  spasm  Unsteadiness on feet  Other low back pain  Rationale for Evaluation and Treatment: Rehabilitation  ONSET DATE: March/April 2023  SUBJECTIVE:   SUBJECTIVE STATEMENT: Still reporting pain behind the knee, it was worse last night.  PERTINENT HISTORY:  LBP  PAIN:  Are you having pain? Yes: NPRS scale: 8 --last night/10 Pain location: behind the R knee Pain description: sometimes dull sometimes sharp Aggravating factors: sleeping, standing  Relieving factors: sitting  PRECAUTIONS: None  WEIGHT BEARING RESTRICTIONS: No  FALLS:  Has patient fallen in last 6 months? No  LIVING ENVIRONMENT: Lives with: lives alone Lives in: House/apartment Stairs: No Has following equipment at home: Single point cane  OCCUPATION: retired  PLOF: Independent  PATIENT GOALS: Get rid of her knee pain and know what is wrong.  NEXT MD VISIT:   OBJECTIVE:   DIAGNOSTIC FINDINGS:  [place] at Dr. Delilah Shan - negative per pt  2022: AP, lateral views of right knee reviewed.  Moderate degenerative changes  with joint space narrowing and subchondral sclerosis noted with marginal  osteophytes in the medial compartment primarily with some findings in the  lateral compartment as well.  No significant patellofemoral arthritis  noted.  No fracture or dislocation.   PATIENT SURVEYS:  LEFS 19 / 80 = 23.8 %  COGNITION: Overall cognitive status: Within functional limits for tasks assessed  SENSATION: WFL  MUSCLE LENGTH: HS: mild tightness Quads: mild tightness ITB: tight R Piriformis: mild R Hip Flexors: mild R Heelcords: mild R   POSTURE: No Significant postural limitations  PALPATION: Palpation: Marked TTP at R gastroc, HS, quads, ITB, R gluteals. Increased tone in post knee. Patellar Mobility: WNL bil     LOWER EXTREMITY ROM:  A/P ROM Right eval Left eval Right  AROM 07/03/2022  Hip flexion [facility]   Knee flexion 103/116* 125 105  Knee extension -5/-2 full -5   (Blank  rows = not tested) * pain  LOWER EXTREMITY MMT:  MMT Right eval Left eval  Hip flexion 4   Hip extension 5   Hip abduction 5   Hip adduction    Hip internal rotation    Hip external rotation    Knee flexion 5 mild pain   Knee extension 5   Ankle dorsiflexion 5   Ankle plantarflexion    Ankle inversion    Ankle eversion     (Blank rows = not tested)   FUNCTIONAL TESTS:  Difficulty with sit to stand transfer due to knee pain  GAIT: Distance walked:  40 Assistive device utilized: Single point cane Level of assistance: Modified independence Comments: antalgic, uses cane in R UE; cane adjusted to correct height  06/05/22 Low back assessed:  Full functional ROM, pain with UPA and CPA mobs to bil L3/4, L4/5, L5/S1 R >L - Pain decreases with repeated mobs. Marked tightness in bil gluteals, piriformis and lumbar paraspinals  R>L. Patient limited with prone press ups due to arm weakness.    TODAY'S TREATMENT:                                                                                                                              DATE:    07/18/22 Therapeutic Exercise: to improve strength and mobility.  Demo, verbal and tactile cues throughout for technique. Nustep L5 x 6 min  Seated heel slide RTB 2x10 bil Seated hip ADD ball squeeze 2x10- 5 sec hold Standing marches 3# x 10 bil Seated LAQ 3# x 10 bil; progressed 2nd set with ball squeeze  07/03/2022 Therapeutic Exercise: to improve strength and mobility.  Demo, verbal and tactile cues throughout for technique. Nustep L5 x 6 min  Review of HEP - discontinue prone hip extension Active straight leg raise 2 x 10 bil  Therapeutic Activity:  to assess progress towards goals.  MMT, ROM, 5x STS, gait x 600' Modalities: [place] x 8 min 1MHz, 1.2 w/cm2 to R LCL to decrease pain and inflammation   06/28/22 Therapeutic Exercise: to improve strength and mobility.  Demo, verbal and tactile cues throughout for technique. Nustep L5x39mn Fwd  step ups x 10 bil 4' Lat step ups x 10 bil 4' Knee flexion 20# 2x10 BLE Knee extension 15# 2x10 BLE Standing hip abduction and extension reviewed  Manual Therapy: to decrease muscle spasm and pain and improve mobility (  performed by Rennie Natter, PT, DPT) STM/TPR to R hamstrings and gastrocs, skilled palpation and monitoring during dry needling. Trigger Point Dry-Needling  Treatment instructions: Expect mild to moderate muscle soreness. S/S of pneumothorax if dry needled over a lung field, and to seek immediate medical attention should they occur. Patient verbalized understanding of these instructions and education. Patient Consent Given: Yes Education handout provided: Previously provided Muscles treated: R lateral hamstrings (distal), R lateral gastroc Electrical stimulation performed: No Parameters: N/A Treatment response/outcome: Twitch Response Elicited and Palpable Increase in Muscle Length    06/11/2022  Therapeutic Exercise: to improve strength and mobility.  Demo, verbal and tactile cues throughout for technique. Seated LAQ x 5 R LTR in supine  Bridges x 10  SLR with quad set x 10 bil  Prone knee bends R - increased pain Prone hip extension x 10 bil  Manual Therapy: to decrease muscle spasm and pain and improve mobility IASTM with foam roller to R lumbar paraspinals, QL, glutes, hamstrings, and calf.  UPA mobs to R lumbar spine grade 2-3, skilled palpation and monitoring during dry needling. Trigger Point Dry-Needling  Treatment instructions: Expect mild to moderate muscle soreness. S/S of pneumothorax if dry needled over a lung field, and to seek immediate medical attention should they occur. Patient verbalized understanding of these instructions and education. Patient Consent Given: Yes Education handout provided: Previously provided Muscles treated: R lumbar multifidi L3-5, R glut med, R piriformis, R lateral hamstring  Treatment response/outcome: Twitch Response  Elicited and Palpable Increase in Muscle Length     PATIENT EDUCATION:  Education details: HEP update and review  Patient Person educated: Patient Education method: Explanation, Demonstration, Verbal cues, and Handouts Education comprehension: verbalized understanding and returned demonstration  HOME EXERCISE PROGRAM: Access Code: [email or web address] URL: [email or web address] Date: 06/11/2022 Prepared by: Glenetta Hew  Exercises - Long Sitting Calf Stretch with Strap  - 2 x daily - 7 x weekly - 1 sets - 3 reps - 30 sec hold - Modified Thomas Stretch  - 1 x daily - 7 x weekly - 1 sets - 3 reps - 30 sec hold - ITB Stretch at [employer] (Mirrored)  - 1 x daily - 7 x weekly - 1 sets - 3 reps - 30-60 hold - Standing Hip Extension with Counter Support  - 1 x daily - 3-4 x weekly - 2 sets - 10 reps - Standing Hip Abduction with Counter Support  - 1 x daily - 3-4 x weekly - 2 sets - 10 reps - Beginner Prone Single Leg Raise  - 1 x daily - 7 x weekly - 1-2 sets - 10 reps - discontinued  ASSESSMENT:  CLINICAL IMPRESSION: Advanced patient through [employer] to improve knee strength  to improve gait tolerance and functional mobility. Good tolerance for exercises but she continues to have localized posterior knee pain. Cues provided with exercises as needed.     OBJECTIVE IMPAIRMENTS: Abnormal gait, decreased balance, decreased ROM, decreased strength, increased fascial restrictions, increased muscle spasms, impaired flexibility, obesity, and pain.   ACTIVITY LIMITATIONS: standing, transfers, and locomotion level  PARTICIPATION LIMITATIONS: meal prep and community activity  PERSONAL FACTORS: Time since onset of injury/illness/exacerbation and 1 comorbidity: LBP  are also affecting patient's functional outcome.   REHAB POTENTIAL: Excellent  CLINICAL DECISION MAKING: Stable/uncomplicated  EVALUATION COMPLEXITY: Low   GOALS: Goals reviewed with patient? Yes  SHORT TERM GOALS: Target  date: 06/06/2022   Patient will be independent with initial HEP. Baseline:  Goal status: MET  2.  Balance assesment completed Baseline:  Goal status: MET    LONG TERM GOALS: Target date: 07/04/2022 extended to 08/14/2022.   Patient will be independent with advanced/ongoing HEP to improve outcomes and carryover.  Baseline:  Goal status: IN PROGRESS 07/03/22- met for current, discontinued prone hip extension.   2.  Patient will report at least 75% improvement in R knee pain to improve QOL. Baseline:  Goal status: IN PROGRESS- 07/03/22- 50% improvement  3.  Patient will demonstrate improved R knee AROM to >/= 2--120 deg to allow for normal gait and stair mechanics. Baseline:  Goal status: IN PROGRESS 07/03/22 -see objective.   4.  Patient will demonstrate improved functional LE strength as demonstrated by being able to complete 5xSTS in <=14.8 sec. Baseline:  Goal status: MET 07/03/2022- 12.4 seconds, with increased knee pain.   5.  Patient will be able to ambulate 600' with LRAD and normal gait pattern without increased pain to access community.  Baseline:  Goal status: IN PROGRESS 07/03/22- noted increased gait deviation after 450'   6. Patient will be stand to cook meals without increased R knee pain. Baseline:  Goal status: IN PROGRESS  7.  Patient will report 28/80 on LEFS  to demonstrate improved functional ability. Baseline: 19 / 80 = 23.8 % Goal status: IN PROGRESS  8.  Patient will demonstrate at least 19/24 on DGI to decrease risk of falls. Baseline: TBD Goal status: IN PROGRESS   9.  Patient will be able to sleep without waking from knee pain Baseline:  Goal status: IN PROGRESS   PLAN:  PT FREQUENCY: 2x/week  PT DURATION: 6 weeks  PLANNED INTERVENTIONS: Therapeutic exercises, Therapeutic activity, Neuromuscular re-education, Balance training, Gait training, Patient/Family education, Self Care, Joint mobilization, Aquatic Therapy, Dry Needling, Electrical  stimulation, Spinal mobilization, Cryotherapy, Moist heat, Taping, Ultrasound, Ionotophoresis '4mg'$ /ml Dexamethasone, and Manual therapy  PLAN FOR NEXT SESSION:  continue DN/MT to lumbar/glutes, and RLE prn, functional LE strength including steps, balance.   Artist Pais, PTA 07/18/2022, 3:37 PM

## 2022-07-18 NOTE — Telephone Encounter (Signed)
Patient brought in form to be filled out Would like to be called when ready for pick ip Placed in bin up front

## 2022-07-18 NOTE — Telephone Encounter (Signed)
Form completed. Copy sent for scan. Pt informed form ready for pick up at front desk.

## 2022-07-19 DIAGNOSIS — H3589 Other specified retinal disorders: Secondary | ICD-10-CM | POA: Diagnosis not present

## 2022-07-19 DIAGNOSIS — Z961 Presence of intraocular lens: Secondary | ICD-10-CM | POA: Diagnosis not present

## 2022-07-19 DIAGNOSIS — H52223 Regular astigmatism, bilateral: Secondary | ICD-10-CM | POA: Diagnosis not present

## 2022-07-19 DIAGNOSIS — H35363 Drusen (degenerative) of macula, bilateral: Secondary | ICD-10-CM | POA: Diagnosis not present

## 2022-07-19 DIAGNOSIS — H353131 Nonexudative age-related macular degeneration, bilateral, early dry stage: Secondary | ICD-10-CM | POA: Diagnosis not present

## 2022-07-24 ENCOUNTER — Ambulatory Visit: Payer: 59 | Admitting: Physical Therapy

## 2022-07-24 ENCOUNTER — Encounter: Payer: Self-pay | Admitting: Physical Therapy

## 2022-07-24 DIAGNOSIS — R2681 Unsteadiness on feet: Secondary | ICD-10-CM | POA: Diagnosis not present

## 2022-07-24 DIAGNOSIS — M5459 Other low back pain: Secondary | ICD-10-CM | POA: Diagnosis not present

## 2022-07-24 DIAGNOSIS — M25661 Stiffness of right knee, not elsewhere classified: Secondary | ICD-10-CM | POA: Diagnosis not present

## 2022-07-24 DIAGNOSIS — G8929 Other chronic pain: Secondary | ICD-10-CM | POA: Diagnosis not present

## 2022-07-24 DIAGNOSIS — R252 Cramp and spasm: Secondary | ICD-10-CM

## 2022-07-24 DIAGNOSIS — M25561 Pain in right knee: Secondary | ICD-10-CM | POA: Diagnosis not present

## 2022-07-24 NOTE — Therapy (Unsigned)
OUTPATIENT PHYSICAL THERAPY TREATMENT      Patient Name: Marie Hebert MRN: [id number] DOB:[birth year], 89 y.o., female Today's Date: 07/24/2022  END OF SESSION:  PT End of Session - 07/24/22 1448     Visit Number 9    Date for PT Re-Evaluation 08/14/22    Authorization Type [employer] MCR    Progress Note Due on Visit 17    PT Start Time 1400    PT Stop Time 1449    PT Time Calculation (min) 49 min    Activity Tolerance Patient tolerated treatment well    Behavior During Therapy WFL for tasks assessed/performed                  Past Medical History:  Diagnosis Date   Back pain    Dizziness    Hypertension    Palpitations    Past Surgical History:  Procedure Laterality Date   CATARACT EXTRACTION  2010   right   COLONOSCOPY  2003   LAPAROSCOPIC APPENDECTOMY N/A 09/25/2020   Procedure: APPENDECTOMY LAPAROSCOPIC;  Surgeon: Mickeal Skinner, MD;  Location: [facility];  Service: General;  Laterality: N/A;   RETINAL LASER PROCEDURE     Patient Active Problem List   Diagnosis Date Noted   Early dry stage nonexudative age-related macular degeneration of both eyes 01/10/2022   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 01/10/2022   Status post surgery 09/25/2020   Essential hypertension 07/12/2017   PAC (premature atrial contraction) 07/12/2017   Mitral regurgitation 07/12/2017   Onychomycosis 08/10/2015   Pain in lower limb 08/10/2015   PCP NOTES>>> 04/06/2015   Skin lesion 03/18/2013   Annual physical exam 12/31/2011   DJD (degenerative joint disease) 12/31/2011   Vitamin D deficiency 12/31/2011   Recurrent UTI 10/10/2011   PALPITATIONS, OCCASIONAL 10/14/2007   BACK PAIN 09/16/2007    PCP: Colon Branch, MD   REFERRING PROVIDER: Colon Branch, MD   REFERRING DIAG: [phone] (ICD-10-CM) - Right knee pain, unspecified chronicity  THERAPY DIAG:  Chronic pain of right knee  Stiffness of right knee, not elsewhere classified  Cramp and  spasm  Unsteadiness on feet  Other low back pain  Rationale for Evaluation and Treatment: Rehabilitation  ONSET DATE: March/April 2023  SUBJECTIVE:   SUBJECTIVE STATEMENT: Pt reports her pain continues to be an 8/10 at night, she is going to check and see about going to sports medicine because she feels like nothing is improving.   PERTINENT HISTORY:  LBP  PAIN:  Are you having pain? Yes: NPRS scale: 8 /10 Pain location: behind the R knee Pain description: sometimes dull sometimes sharp Aggravating factors: sleeping, standing  Relieving factors: sitting  PRECAUTIONS: None  WEIGHT BEARING RESTRICTIONS: No  FALLS:  Has patient fallen in last 6 months? No  LIVING ENVIRONMENT: Lives with: lives alone Lives in: House/apartment Stairs: No Has following equipment at home: Single point cane  OCCUPATION: retired  PLOF: Independent  PATIENT GOALS: Get rid of her knee pain and know what is wrong.  NEXT MD VISIT:   OBJECTIVE:   DIAGNOSTIC FINDINGS:  [place] at Dr. Delilah Shan - negative per pt  2022: AP, lateral views of right knee reviewed.  Moderate degenerative changes  with joint space narrowing and subchondral sclerosis noted with marginal  osteophytes in the medial compartment primarily with some findings in the  lateral compartment as well.  No significant patellofemoral arthritis  noted.  No fracture or dislocation.   PATIENT SURVEYS:  LEFS 19 / 80 = 23.8 %  COGNITION: Overall cognitive status: Within functional limits for tasks assessed     SENSATION: WFL  MUSCLE LENGTH: HS: mild tightness Quads: mild tightness ITB: tight R Piriformis: mild R Hip Flexors: mild R Heelcords: mild R   POSTURE: No Significant postural limitations  PALPATION: Palpation: Marked TTP at R gastroc, HS, quads, ITB, R gluteals. Increased tone in post knee. Patellar Mobility: WNL bil     LOWER EXTREMITY ROM:  A/P ROM Right eval Left eval Right  AROM 07/03/2022  Hip  flexion [facility]   Knee flexion 103/116* 125 105  Knee extension -5/-2 full -5   (Blank rows = not tested) * pain  LOWER EXTREMITY MMT:  MMT Right eval Left eval  Hip flexion 4   Hip extension 5   Hip abduction 5   Hip adduction    Hip internal rotation    Hip external rotation    Knee flexion 5 mild pain   Knee extension 5   Ankle dorsiflexion 5   Ankle plantarflexion    Ankle inversion    Ankle eversion     (Blank rows = not tested)   FUNCTIONAL TESTS:  Difficulty with sit to stand transfer due to knee pain  GAIT: Distance walked:  40 Assistive device utilized: Single point cane Level of assistance: Modified independence Comments: antalgic, uses cane in R UE; cane adjusted to correct height  06/05/22 Low back assessed:  Full functional ROM, pain with UPA and CPA mobs to bil L3/4, L4/5, L5/S1 R >L - Pain decreases with repeated mobs. Marked tightness in bil gluteals, piriformis and lumbar paraspinals  R>L. Patient limited with prone press ups due to arm weakness.   07/24/2022 Posture: forward head, rounded shoulders  CERVICAL ROM:  AROM 07/24/2022  Cervical Flexion 30  Cervical Extension 10  Cervical Rotation to Left 45  Cervical Rotation to Right 45  Left Sidebend   Right Sidebend    UPPER EXTREMITY MMT:  MMT Right 07/24/2022 Left 07/24/2022  Shoulder flexion 4 5  Shoulder extension    Shoulder abduction 4 5  Shoulder internal rotation 5 5  Shoulder external rotation 5 5  Elbow flexion 5 5  Elbow extension 5 5  Wrist flexion 5 5  Wrist extension 5 5  Grip strength good good   (Blank rows = not tested)  UPPER EXTREMITY ROM:  Active ROM Right eval Left eval  Shoulder flexion 90 (P 150)  130  Shoulder extension 40 62  Shoulder abduction 90 145  Shoulder internal rotation Functional low back ; 50  Functional to upper back T6; 80  Shoulder external rotation 70 85   (Blank rows = not tested) Right hand dominant   TODAY'S TREATMENT:                                                                                                                               DATE:  07/24/22 Therapeutic Exercise: to improve strength and mobility.  Demo, verbal and tactile cues throughout for technique. Nustep L5x30mn Standing hip abduction x 10 bil- pain with L side Standing hip extension x 10 bil Standing HS curls x 10 bil Therapeutic Activity:  assessment of shoulder and neck, see notes above.  Manual Therapy: to decrease muscle spasm and pain and improve mobility STM/TPR to R UT, L/S, cervical paraspinals, skilled palpation and monitoring during dry needling. Trigger Point Dry-Needling  Treatment instructions: Expect mild to moderate muscle soreness. S/S of pneumothorax if dry needled over a lung field, and to seek immediate medical attention should they occur. Patient verbalized understanding of these instructions and education. Patient Consent Given: Yes Education handout provided: Previously provided Muscles treated: R UT Electrical stimulation performed: No Parameters: N/A Treatment response/outcome: Twitch Response Elicited and Palpable Increase in Muscle Length    07/18/22 Therapeutic Exercise: to improve strength and mobility.  Demo, verbal and tactile cues throughout for technique. Nustep L5 x 6 min  Seated heel slide RTB 2x10 bil Seated hip ADD ball squeeze 2x10- 5 sec hold Standing marches 3# x 10 bil Seated LAQ 3# x 10 bil; progressed 2nd set with ball squeeze  07/03/2022 Therapeutic Exercise: to improve strength and mobility.  Demo, verbal and tactile cues throughout for technique. Nustep L5 x 6 min  Review of HEP - discontinue prone hip extension Active straight leg raise 2 x 10 bil  Therapeutic Activity:  to assess progress towards goals.  MMT, ROM, 5x STS, gait x 600' Modalities: UKoreax 8 min 1MHz, 1.2 w/cm2 to R LCL to decrease pain and inflammation   06/28/22 Therapeutic Exercise: to improve strength and mobility.  Demo,  verbal and tactile cues throughout for technique. Nustep L5x666m Fwd step ups x 10 bil 4' Lat step ups x 10 bil 4' Knee flexion 20# 2x10 BLE Knee extension 15# 2x10 BLE Standing hip abduction and extension reviewed  Manual Therapy: to decrease muscle spasm and pain and improve mobility (performed by ElRennie NatterPT, DPT) STM/TPR to R hamstrings and gastrocs, skilled palpation and monitoring during dry needling. Trigger Point Dry-Needling  Treatment instructions: Expect mild to moderate muscle soreness. S/S of pneumothorax if dry needled over a lung field, and to seek immediate medical attention should they occur. Patient verbalized understanding of these instructions and education. Patient Consent Given: Yes Education handout provided: Previously provided Muscles treated: R lateral hamstrings (distal), R lateral gastroc Electrical stimulation performed: No Parameters: N/A Treatment response/outcome: Twitch Response Elicited and Palpable Increase in Muscle Length     PATIENT EDUCATION:  Education details: HEP update and review  Patient Person educated: Patient Education method: Explanation, Demonstration, Verbal cues, and Handouts Education comprehension: verbalized understanding and returned demonstration  HOME EXERCISE PROGRAM: Access Code: [email or web address]: [email or web address] Date: 06/11/2022 Prepared by: ElGlenetta HewExercises - Long Sitting Calf Stretch with Strap  - 2 x daily - 7 x weekly - 1 sets - 3 reps - 30 sec hold - Modified Thomas Stretch  - 1 x daily - 7 x weekly - 1 sets - 3 reps - 30 sec hold - ITB Stretch at [employer])  - 1 x daily - 7 x weekly - 1 sets - 3 reps - 30-60 hold - Standing Hip Extension with Counter Support  - 1 x daily - 3-4 x weekly - 2 sets - 10 reps - Standing Hip Abduction with Counter Support  - 1 x daily - 3-4 x weekly - 2  sets - 10 reps - Beginner Prone Single Leg Raise  - 1 x daily - 7 x weekly - 1-2 sets -  10 reps - discontinued  ASSESSMENT:  CLINICAL IMPRESSION: PTA reviewed current exercises with patient, then PT performed assessment of new order for R shoulder pain.  Madgie E Morad has forward head posture, with rounded shoulders, R shoulder is protracted and demonstrates significant limitations in AROM compared to L shoulder, although PROM is good.  Also has restrictions in cervical ROM.  Educated on how her posture and shoulder position affects her shoulder mechanics and importance of postural training.   Manual therapy focused on upper trap and levator today, with TrDN to R UT, which provided significant pain relief.  Buna E Weekes would benefit from skilled physical therapy to improve posture, decrease R shoulder pain and improve R shoulder ROM, and improve QOL and ability to perform ADLs.       OBJECTIVE IMPAIRMENTS: Abnormal gait, decreased balance, decreased ROM, decreased strength, increased fascial restrictions, increased muscle spasms, impaired flexibility, obesity, and pain.   ACTIVITY LIMITATIONS: standing, transfers, and locomotion level  PARTICIPATION LIMITATIONS: meal prep and community activity  PERSONAL FACTORS: Time since onset of injury/illness/exacerbation and 1 comorbidity: LBP  are also affecting patient's functional outcome.   REHAB POTENTIAL: Excellent  CLINICAL DECISION MAKING: Stable/uncomplicated  EVALUATION COMPLEXITY: Low   GOALS: Goals reviewed with patient? Yes  SHORT TERM GOALS: Target date: 06/06/2022   Patient will be independent with initial HEP. Baseline:  Goal status: MET  2.  Balance assesment completed Baseline:  Goal status: MET    LONG TERM GOALS: Target date: 07/04/2022 extended to 08/14/2022.   Patient will be independent with advanced/ongoing HEP to improve outcomes and carryover.  Baseline:  Goal status: IN PROGRESS 07/03/22- met for current, discontinued prone hip extension.   2.  Patient will report at least 75%  improvement in R knee pain to improve QOL. Baseline:  Goal status: IN PROGRESS- 07/03/22- 50% improvement  3.  Patient will demonstrate improved R knee AROM to >/= 2--120 deg to allow for normal gait and stair mechanics. Baseline:  Goal status: IN PROGRESS 07/03/22 -see objective.   4.  Patient will demonstrate improved functional LE strength as demonstrated by being able to complete 5xSTS in <=14.8 sec. Baseline:  Goal status: MET 07/03/2022- 12.4 seconds, with increased knee pain.   5.  Patient will be able to ambulate 600' with LRAD and normal gait pattern without increased pain to access community.  Baseline:  Goal status: IN PROGRESS 07/03/22- noted increased gait deviation after 450'   6. Patient will be stand to cook meals without increased R knee pain. Baseline:  Goal status: IN PROGRESS  7.  Patient will report 28/80 on LEFS  to demonstrate improved functional ability. Baseline: 19 / 80 = 23.8 % Goal status: IN PROGRESS  8.  Patient will demonstrate at least 19/24 on DGI to decrease risk of falls. Baseline: TBD Goal status: IN PROGRESS   9.  Patient will be able to sleep without waking from knee pain Baseline:  Goal status: IN PROGRESS  ***  PLAN:  PT FREQUENCY: 2x/week  PT DURATION: 6 weeks  PLANNED INTERVENTIONS: Therapeutic exercises, Therapeutic activity, Neuromuscular re-education, Balance training, Gait training, Patient/Family education, Self Care, Joint mobilization, Aquatic Therapy, Dry Needling, Electrical stimulation, Spinal mobilization, Cryotherapy, Moist heat, Taping, Ultrasound, Ionotophoresis '4mg'$ /ml Dexamethasone, and Manual therapy  PLAN FOR NEXT SESSION:  postural strengthening, R shoulder strengthening, manual therapy, TrDN.  Rennie Natter, PT, DPT  07/24/2022, 6:37 PM

## 2022-07-25 ENCOUNTER — Ambulatory Visit: Payer: Self-pay

## 2022-07-25 ENCOUNTER — Ambulatory Visit (INDEPENDENT_AMBULATORY_CARE_PROVIDER_SITE_OTHER): Payer: 59 | Admitting: Family Medicine

## 2022-07-25 ENCOUNTER — Encounter: Payer: Self-pay | Admitting: Family Medicine

## 2022-07-25 VITALS — BP 122/62 | Ht 60.0 in | Wt 175.0 lb

## 2022-07-25 DIAGNOSIS — M7989 Other specified soft tissue disorders: Secondary | ICD-10-CM | POA: Diagnosis not present

## 2022-07-25 DIAGNOSIS — E559 Vitamin D deficiency, unspecified: Secondary | ICD-10-CM

## 2022-07-25 DIAGNOSIS — M5416 Radiculopathy, lumbar region: Secondary | ICD-10-CM

## 2022-07-25 DIAGNOSIS — M1711 Unilateral primary osteoarthritis, right knee: Secondary | ICD-10-CM | POA: Diagnosis not present

## 2022-07-25 DIAGNOSIS — M85851 Other specified disorders of bone density and structure, right thigh: Secondary | ICD-10-CM | POA: Diagnosis not present

## 2022-07-25 DIAGNOSIS — M25561 Pain in right knee: Secondary | ICD-10-CM

## 2022-07-25 NOTE — Assessment & Plan Note (Addendum)
Acutely occurring with tenderness in the posterior aspect of the thigh.  There does appear to be a change in the subcutaneous tissue but does not involve the muscle or bone.  No concerning signs based on ultrasound. -Counseled on home exercise therapy and supportive care. -Counseled on Voltaren over-the-counter gel. -ANA, CK, CRP, sed rate and CK and uric acid -Could consider further imaging or shockwave therapy

## 2022-07-25 NOTE — Progress Notes (Signed)
Marie Hebert - 89 y.o. female MRN [id number]  Date of birth: [birth year]  SUBJECTIVE:  Including CC & ROS.  No chief complaint on file.   Marie Hebert is a 89 y.o. female that is presenting with right leg pain as well as knee pain and posterior thigh pain.  She has been seen over the past 8 months for these types of pains.  Initially received a steroid injection into the knee that did improve some of her symptoms.  She has had an MRI of the right knee as well as an MRI of the lumbar spine.  She reports having different injections into the lumbar spine.  Currently her most symptomatic pain is in the posterior aspect of her thigh and this occurs worse at night.    Review of Systems See HPI   HISTORY: Past Medical, Surgical, Social, and Family History Reviewed & Updated per EMR.   Pertinent Historical Findings include:  Past Medical History:  Diagnosis Date   Back pain    Dizziness    Hypertension    Palpitations     Past Surgical History:  Procedure Laterality Date   CATARACT EXTRACTION  2010   right   COLONOSCOPY  2003   LAPAROSCOPIC APPENDECTOMY N/A 09/25/2020   Procedure: APPENDECTOMY LAPAROSCOPIC;  Surgeon: Mickeal Skinner, MD;  Location: [facility];  Service: General;  Laterality: N/A;   RETINAL LASER PROCEDURE       PHYSICAL EXAM:  VS: BP 122/62   Ht 5' (1.524 m)   Wt 175 lb (79.4 kg)   BMI 34.18 kg/m  Physical Exam Gen: NAD, alert, cooperative with exam, well-appearing MSK:  Neurovascularly intact    Limited ultrasound: Right knee and thigh pain.:  Mild effusion suprapatellar pouch. Normal-appearing quadricep and patellar tendon. Medial joint space narrowing with outpouching of the medial meniscus. More moderate lateral joint space narrowing with outpouching of the lateral meniscus. No Baker's cyst appreciated. Point of maximal intensity skinning the posterior distal thigh reveals a soft tissue nodule that appears to be loculated and no hyperemia  associated.  Summary: Findings consistent with degenerative changes of the right knee and a loculated nodule within the soft tissue of the posterior thigh.  Ultrasound and interpretation by Clearance Coots, MD    ASSESSMENT & PLAN:   Lumbar radiculopathy Acute on chronic in nature.  She reports pain on the right lateral aspect of the thigh and leg.  Reports having MRI completed and injections performed.  Continues to have pain that does wake her up at night. -Counseled on home exercise therapy and supportive care. -Will fax her previous records. -Could consider epidural.  DJD (degenerative joint disease) Acutely occurring.  She does have degenerative changes with mild effusion on exam.  Seems less concentrated at the knee as to the source of her pain.  She has received a steroid intra-articular injection. -Counseled On home exercise therapy and supportive care. -Could consider gel injection  Vitamin D deficiency Has been low in the past.  Check vitamin D.  Soft tissue mass Acutely occurring with tenderness in the posterior aspect of the thigh.  There does appear to be a change in the subcutaneous tissue but does not involve the muscle or bone.  No concerning signs based on ultrasound. -Counseled on home exercise therapy and supportive care. -Counseled on Voltaren over-the-counter gel. -ANA, CK, CRP, sed rate and CK and uric acid -Could consider further imaging or shockwave therapy  Osteopenia of neck of right femur Acute on chronic  in nature.  May be contributing to some of the symptoms that she is experiencing from the lumbar spine. -Counseled on supportive care. -Check DEXA scan.

## 2022-07-25 NOTE — Assessment & Plan Note (Signed)
Acute on chronic in nature.  She reports pain on the right lateral aspect of the thigh and leg.  Reports having MRI completed and injections performed.  Continues to have pain that does wake her up at night. -Counseled on home exercise therapy and supportive care. -Will fax her previous records. -Could consider epidural.

## 2022-07-25 NOTE — Assessment & Plan Note (Signed)
Acutely occurring.  She does have degenerative changes with mild effusion on exam.  Seems less concentrated at the knee as to the source of her pain.  She has received a steroid intra-articular injection. -Counseled On home exercise therapy and supportive care. -Could consider gel injection

## 2022-07-25 NOTE — Assessment & Plan Note (Signed)
Has been low in the past.  Check vitamin D.

## 2022-07-25 NOTE — Patient Instructions (Signed)
Nice to meet you Please try topical lidocaine or topical voltaren over the counter gel on the area  [facility] call with the results from today  Please try to schedule an updated bone denisty scan downstairs   Please try to fax me the MRIs of your knee and lower back. Please send me a message in MyChart with any questions or updates.  Please see me back in 4 weeks.   --Dr. Raeford Razor

## 2022-07-25 NOTE — Assessment & Plan Note (Signed)
Acute on chronic in nature.  May be contributing to some of the symptoms that she is experiencing from the lumbar spine. -Counseled on supportive care. -Check DEXA scan.

## 2022-07-26 ENCOUNTER — Ambulatory Visit: Payer: 59 | Admitting: Family Medicine

## 2022-07-27 LAB — ANA,IFA RA DIAG PNL W/RFLX TIT/PATN
ANA Titer 1: NEGATIVE
Cyclic Citrullin Peptide Ab: 8 units (ref 0–19)
Rheumatoid fact SerPl-aCnc: <10 IU/mL (ref <14.0)

## 2022-07-27 LAB — CK: Total CK: 49 U/L (ref 26–161)

## 2022-07-27 LAB — URIC ACID: Uric Acid: 3.7 mg/dL (ref 3.1–7.9)

## 2022-07-27 LAB — SEDIMENTATION RATE: Sed Rate: 9 mm/hr (ref 0–40)

## 2022-07-27 LAB — VITAMIN D 25 HYDROXY (VIT D DEFICIENCY, FRACTURES): Vit D, 25-Hydroxy: 66.1 ng/mL (ref 30.0–100.0)

## 2022-07-27 LAB — C-REACTIVE PROTEIN: CRP: 2 mg/L (ref 0–10)

## 2022-07-29 NOTE — Therapy (Signed)
OUTPATIENT PHYSICAL THERAPY TREATMENT    Patient Name: Marie Hebert MRN: [id number] DOB:01-Jan-1933, 89 y.o., female Today's Date: 07/24/2022  END OF SESSION:  PT End of Session - 07/24/22 1448     Visit Number 9    Date for PT Re-Evaluation 08/14/22    Authorization Type [employer] MCR    Progress Note Due on Visit 17    PT Start Time 1400    PT Stop Time 1449    PT Time Calculation (min) 49 min    Activity Tolerance Patient tolerated treatment well    Behavior During Therapy WFL for tasks assessed/performed                  Past Medical History:  Diagnosis Date   Back pain    Dizziness    Hypertension    Palpitations    Past Surgical History:  Procedure Laterality Date   CATARACT EXTRACTION  2010   right   COLONOSCOPY  2003   LAPAROSCOPIC APPENDECTOMY N/A 09/25/2020   Procedure: APPENDECTOMY LAPAROSCOPIC;  Surgeon: Mickeal Skinner, MD;  Location: [place];  Service: General;  Laterality: N/A;   RETINAL LASER PROCEDURE     Patient Active Problem List   Diagnosis Date Noted   Early dry stage nonexudative age-related macular degeneration of both eyes 01/10/2022   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 01/10/2022   Status post surgery 09/25/2020   Essential hypertension 07/12/2017   PAC (premature atrial contraction) 07/12/2017   Mitral regurgitation 07/12/2017   Onychomycosis 08/10/2015   Pain in lower limb 08/10/2015   PCP NOTES>>> 04/06/2015   Skin lesion 03/18/2013   Annual physical exam 12/31/2011   DJD (degenerative joint disease) 12/31/2011   Vitamin D deficiency 12/31/2011   Recurrent UTI 10/10/2011   PALPITATIONS, OCCASIONAL 10/14/2007   BACK PAIN 09/16/2007    PCP: Colon Branch, MD   REFERRING PROVIDER: Colon Branch, MD   REFERRING DIAG: [phone] (ICD-10-CM) - Right knee pain, unspecified chronicity  THERAPY DIAG:  Chronic pain of right knee  Stiffness of right knee, not elsewhere classified  Cramp and  spasm  Unsteadiness on feet  Other low back pain  Rationale for Evaluation and Treatment: Rehabilitation  ONSET DATE: March/April 2023  SUBJECTIVE:   SUBJECTIVE STATEMENT: ***  PERTINENT HISTORY:  LBP  PAIN:  Are you having pain? Yes: NPRS scale: 8 /10 Pain location: behind the R knee Pain description: sometimes dull sometimes sharp Aggravating factors: sleeping, standing  Relieving factors: sitting  Are you having pain? Yes: NPRS scale: 8 /10 Pain location: R shoulder  Pain description: ache Aggravating factors: reaching overhead  Relieving factors: not moving  PRECAUTIONS: None  WEIGHT BEARING RESTRICTIONS: No  FALLS:  Has patient fallen in last 6 months? No  LIVING ENVIRONMENT: Lives with: lives alone Lives in: House/apartment Stairs: No Has following equipment at home: Single point cane  OCCUPATION: retired  PLOF: Independent  PATIENT GOALS: Get rid of her knee pain and know what is wrong.  NEXT MD VISIT:   OBJECTIVE:   DIAGNOSTIC FINDINGS:  [place] at Dr. Delilah Shan - negative per pt  2022: AP, lateral views of right knee reviewed.  Moderate degenerative changes  with joint space narrowing and subchondral sclerosis noted with marginal  osteophytes in the medial compartment primarily with some findings in the  lateral compartment as well.  No significant patellofemoral arthritis  noted.  No fracture or dislocation.   PATIENT SURVEYS:  LEFS 19 / 80 =  23.8 %  COGNITION: Overall cognitive status: Within functional limits for tasks assessed     SENSATION: WFL  MUSCLE LENGTH: HS: mild tightness Quads: mild tightness ITB: tight R Piriformis: mild R Hip Flexors: mild R Heelcords: mild R   POSTURE: No Significant postural limitations  PALPATION: Palpation: Marked TTP at R gastroc, HS, quads, ITB, R gluteals. Increased tone in post knee. Patellar Mobility: WNL bil     LOWER EXTREMITY ROM:  A/P ROM Right eval Left eval Right   AROM 07/03/2022  Hip flexion [facility]   Knee flexion 103/116* 125 105  Knee extension -5/-2 full -5   (Blank rows = not tested) * pain  LOWER EXTREMITY MMT:  MMT Right eval Left eval  Hip flexion 4   Hip extension 5   Hip abduction 5   Hip adduction    Hip internal rotation    Hip external rotation    Knee flexion 5 mild pain   Knee extension 5   Ankle dorsiflexion 5   Ankle plantarflexion    Ankle inversion    Ankle eversion     (Blank rows = not tested)   FUNCTIONAL TESTS:  Difficulty with sit to stand transfer due to knee pain  GAIT: Distance walked:  40 Assistive device utilized: Single point cane Level of assistance: Modified independence Comments: antalgic, uses cane in R UE; cane adjusted to correct height  06/05/22 Low back assessed:  Full functional ROM, pain with UPA and CPA mobs to bil L3/4, L4/5, L5/S1 R >L - Pain decreases with repeated mobs. Marked tightness in bil gluteals, piriformis and lumbar paraspinals  R>L. Patient limited with prone press ups due to arm weakness.   07/24/2022 SHOULDER ASSESSMENT Posture: forward head, rounded shoulders  CERVICAL ROM:  AROM 07/24/2022  Cervical Flexion 30  Cervical Extension 10  Cervical Rotation to Left 45  Cervical Rotation to Right 45   UPPER EXTREMITY MMT:  MMT Right 07/24/2022 Left 07/24/2022  Shoulder flexion 4 5  Shoulder extension    Shoulder abduction 4 5  Shoulder internal rotation 5 5  Shoulder external rotation 5 5  Elbow flexion 5 5  Elbow extension 5 5  Wrist flexion 5 5  Wrist extension 5 5  Grip strength good good   (Blank rows = not tested)  UPPER EXTREMITY ROM:  Active ROM Right eval Left eval  Shoulder flexion 90 (P 150)  130  Shoulder extension 40 62  Shoulder abduction 90 145  Shoulder internal rotation Functional low back ; 50  Functional to upper back T6; 80  Shoulder external rotation 70 85   (Blank rows = not tested) Right hand dominant  Palpation:  tenderness/tightness R upper trap, R levator scapulae, R cervical paraspinals, R supraspinatus  TODAY'S TREATMENT:  DATE:    07/30/22 07/24/22 Therapeutic Exercise: to improve strength and mobility.  Demo, verbal and tactile cues throughout for technique. Nustep [id number] RECERT/GOALS     4/54/09 Therapeutic Exercise: to improve strength and mobility.  Demo, verbal and tactile cues throughout for technique. Nustep [id number] Standing hip abduction x 10 bil- pain with L side Standing hip extension x 10 bil Standing HS curls x 10 bil Therapeutic Activity:  assessment of shoulder and neck, see notes above.  Manual Therapy: to decrease muscle spasm and pain and improve mobility STM/TPR to R UT, L/S, cervical paraspinals, skilled palpation and monitoring during dry needling. Trigger Point Dry-Needling  Treatment instructions: Expect mild to moderate muscle soreness. S/S of pneumothorax if dry needled over a lung field, and to seek immediate medical attention should they occur. Patient verbalized understanding of these instructions and education. Patient Consent Given: Yes Education handout provided: Previously provided Muscles treated: R UT Electrical stimulation performed: No Parameters: N/A Treatment response/outcome: Twitch Response Elicited and Palpable Increase in Muscle Length    07/18/22 Therapeutic Exercise: to improve strength and mobility.  Demo, verbal and tactile cues throughout for technique. Nustep L5 x 6 min  Seated heel slide RTB 2x10 bil Seated hip ADD ball squeeze 2x10- 5 sec hold Standing marches 3# x 10 bil Seated LAQ 3# x 10 bil; progressed 2nd set with ball squeeze  07/03/2022 Therapeutic Exercise: to improve strength and mobility.  Demo, verbal and tactile cues throughout for technique. Nustep L5 x 6 min  Review of HEP - discontinue prone hip  extension Active straight leg raise 2 x 10 bil  Therapeutic Activity:  to assess progress towards goals.  MMT, ROM, 5x STS, gait x 600' Modalities: UKoreax 8 min 1MHz, 1.2 w/cm2 to R LCL to decrease pain and inflammation   06/28/22 Therapeutic Exercise: to improve strength and mobility.  Demo, verbal and tactile cues throughout for technique. Nustep L5x653m Fwd step ups x 10 bil 4' Lat step ups x 10 bil 4' Knee flexion 20# 2x10 BLE Knee extension 15# 2x10 BLE Standing hip abduction and extension reviewed  Manual Therapy: to decrease muscle spasm and pain and improve mobility (performed by ElRennie NatterPT, DPT) STM/TPR to R hamstrings and gastrocs, skilled palpation and monitoring during dry needling. Trigger Point Dry-Needling  Treatment instructions: Expect mild to moderate muscle soreness. S/S of pneumothorax if dry needled over a lung field, and to seek immediate medical attention should they occur. Patient verbalized understanding of these instructions and education. Patient Consent Given: Yes Education handout provided: Previously provided Muscles treated: R lateral hamstrings (distal), R lateral gastroc Electrical stimulation performed: No Parameters: N/A Treatment response/outcome: Twitch Response Elicited and Palpable Increase in Muscle Length     PATIENT EDUCATION:  Education details: anticipated POC  Patient Person educated: Patient Education method: Explanation Education comprehension: verbalized understanding and returned demonstration  HOME EXERCISE PROGRAM: Access Code: [email or web address]: [email or web address] Date: 06/11/2022 Prepared by: ElGlenetta HewExercises - Long Sitting Calf Stretch with Strap  - 2 x daily - 7 x weekly - 1 sets - 3 reps - 30 sec hold - Modified Thomas Stretch  - 1 x daily - 7 x weekly - 1 sets - 3 reps - 30 sec hold - ITB Stretch at [employer])  - 1 x daily - 7 x weekly - 1 sets - 3 reps - 30-60 hold - Standing  Hip Extension with Counter Support  - 1 x daily - 3-4 x weekly -  2 sets - 10 reps - Standing Hip Abduction with Counter Support  - 1 x daily - 3-4 x weekly - 2 sets - 10 reps - Beginner Prone Single Leg Raise  - 1 x daily - 7 x weekly - 1-2 sets - 10 reps - discontinued  ASSESSMENT:  CLINICAL IMPRESSION: ***     OBJECTIVE IMPAIRMENTS: Abnormal gait, decreased balance, decreased ROM, decreased strength, increased fascial restrictions, increased muscle spasms, impaired flexibility, obesity, and pain.   ACTIVITY LIMITATIONS: standing, transfers, and locomotion level  PARTICIPATION LIMITATIONS: meal prep and community activity  PERSONAL FACTORS: Time since onset of injury/illness/exacerbation and 1 comorbidity: LBP  are also affecting patient's functional outcome.   REHAB POTENTIAL: Excellent  CLINICAL DECISION MAKING: Stable/uncomplicated  EVALUATION COMPLEXITY: Low   GOALS: Goals reviewed with patient? Yes  SHORT TERM GOALS: Target date: 06/06/2022   Patient will be independent with initial HEP. Baseline:  Goal status: MET  2.  Balance assesment completed Baseline:  Goal status: MET    LONG TERM GOALS: Target date: 07/04/2022 extended to 08/14/2022.   Patient will be independent with advanced/ongoing HEP to improve outcomes and carryover.  Baseline:  Goal status: IN PROGRESS 07/03/22- met for current, discontinued prone hip extension.   2.  Patient will report at least 75% improvement in R knee pain to improve QOL. Baseline:  Goal status: IN PROGRESS- 07/03/22- 50% improvement  3.  Patient will demonstrate improved R knee AROM to >/= 2--120 deg to allow for normal gait and stair mechanics. Baseline:  Goal status: IN PROGRESS 07/03/22 -see objective.   4.  Patient will demonstrate improved functional LE strength as demonstrated by being able to complete 5xSTS in <=14.8 sec. Baseline:  Goal status: MET 07/03/2022- 12.4 seconds, with increased knee pain.   5.  Patient will  be able to ambulate 600' with LRAD and normal gait pattern without increased pain to access community.  Baseline:  Goal status: IN PROGRESS 07/03/22- noted increased gait deviation after 450'   6. Patient will be stand to cook meals without increased R knee pain. Baseline:  Goal status: IN PROGRESS  7.  Patient will report 28/80 on LEFS  to demonstrate improved functional ability. Baseline: 19 / 80 = 23.8 % Goal status: IN PROGRESS  8.  Patient will demonstrate at least 19/24 on DGI to decrease risk of falls. Baseline: TBD Goal status: IN PROGRESS   9.  Patient will be able to sleep without waking from knee pain Baseline:  Goal status: IN PROGRESS  10.  Patient will report 50% improvement in Right shoulder pain to improve QOL.  Baseline:  Goal status: NEW  11.  Patient will report 10% improvement on QuickDash.  Baseline: TBA Goal status: NEW      PLAN:  PT FREQUENCY: 2x/week  PT DURATION: 6 weeks  PLANNED INTERVENTIONS: Therapeutic exercises, Therapeutic activity, Neuromuscular re-education, Balance training, Gait training, Patient/Family education, Self Care, Joint mobilization, Aquatic Therapy, Dry Needling, Electrical stimulation, Spinal mobilization, Cryotherapy, Moist heat, Taping, Ultrasound, Ionotophoresis '4mg'$ /ml Dexamethasone, and Manual therapy  PLAN FOR NEXT SESSION:  postural strengthening, R shoulder strengthening, manual therapy, TrDN.   Assess old goals and discontinue as appropriate, progress note, update QuickDash goal.   Rennie Natter, PT, DPT  07/24/2022, 6:37 PM

## 2022-07-30 ENCOUNTER — Encounter: Payer: Self-pay | Admitting: Physical Therapy

## 2022-07-30 ENCOUNTER — Ambulatory Visit (HOSPITAL_BASED_OUTPATIENT_CLINIC_OR_DEPARTMENT_OTHER)
Admission: RE | Admit: 2022-07-30 | Discharge: 2022-07-30 | Disposition: A | Discharge status: Home / Self Care | Payer: 59 | Source: Ambulatory Visit | Attending: Family Medicine | Admitting: Family Medicine

## 2022-07-30 ENCOUNTER — Telehealth: Payer: Self-pay | Admitting: Family Medicine

## 2022-07-30 ENCOUNTER — Ambulatory Visit: Payer: 59 | Admitting: Family Medicine

## 2022-07-30 ENCOUNTER — Ambulatory Visit: Payer: 59 | Attending: Internal Medicine | Admitting: Physical Therapy

## 2022-07-30 DIAGNOSIS — M85851 Other specified disorders of bone density and structure, right thigh: Secondary | ICD-10-CM | POA: Insufficient documentation

## 2022-07-30 DIAGNOSIS — M85852 Other specified disorders of bone density and structure, left thigh: Secondary | ICD-10-CM | POA: Diagnosis not present

## 2022-07-30 DIAGNOSIS — M25661 Stiffness of right knee, not elsewhere classified: Secondary | ICD-10-CM | POA: Diagnosis not present

## 2022-07-30 DIAGNOSIS — R252 Cramp and spasm: Secondary | ICD-10-CM | POA: Insufficient documentation

## 2022-07-30 DIAGNOSIS — G8929 Other chronic pain: Secondary | ICD-10-CM | POA: Insufficient documentation

## 2022-07-30 DIAGNOSIS — M25511 Pain in right shoulder: Secondary | ICD-10-CM | POA: Insufficient documentation

## 2022-07-30 DIAGNOSIS — R2681 Unsteadiness on feet: Secondary | ICD-10-CM | POA: Diagnosis not present

## 2022-07-30 DIAGNOSIS — Z780 Asymptomatic menopausal state: Secondary | ICD-10-CM | POA: Diagnosis not present

## 2022-07-30 DIAGNOSIS — M25561 Pain in right knee: Secondary | ICD-10-CM | POA: Insufficient documentation

## 2022-07-30 DIAGNOSIS — M5459 Other low back pain: Secondary | ICD-10-CM | POA: Diagnosis not present

## 2022-07-30 DIAGNOSIS — M25611 Stiffness of right shoulder, not elsewhere classified: Secondary | ICD-10-CM | POA: Insufficient documentation

## 2022-07-30 NOTE — Telephone Encounter (Signed)
Informed of results.   Rosemarie Ax, MD [facility] 07/30/2022, 9:43 AM

## 2022-08-01 ENCOUNTER — Telehealth: Payer: Self-pay | Admitting: Family Medicine

## 2022-08-01 NOTE — Telephone Encounter (Signed)
Informed of results.   Rosemarie Ax, MD [facility] 08/01/2022, 11:04 AM

## 2022-08-02 ENCOUNTER — Encounter: Payer: Self-pay | Admitting: Physical Therapy

## 2022-08-02 ENCOUNTER — Ambulatory Visit: Payer: 59 | Admitting: Physical Therapy

## 2022-08-02 DIAGNOSIS — R2681 Unsteadiness on feet: Secondary | ICD-10-CM

## 2022-08-02 DIAGNOSIS — M25611 Stiffness of right shoulder, not elsewhere classified: Secondary | ICD-10-CM

## 2022-08-02 DIAGNOSIS — R252 Cramp and spasm: Secondary | ICD-10-CM | POA: Diagnosis not present

## 2022-08-02 DIAGNOSIS — M5459 Other low back pain: Secondary | ICD-10-CM

## 2022-08-02 DIAGNOSIS — M25511 Pain in right shoulder: Secondary | ICD-10-CM | POA: Diagnosis not present

## 2022-08-02 DIAGNOSIS — M25561 Pain in right knee: Secondary | ICD-10-CM | POA: Diagnosis not present

## 2022-08-02 DIAGNOSIS — M25661 Stiffness of right knee, not elsewhere classified: Secondary | ICD-10-CM

## 2022-08-02 DIAGNOSIS — G8929 Other chronic pain: Secondary | ICD-10-CM

## 2022-08-02 NOTE — Therapy (Signed)
OUTPATIENT PHYSICAL THERAPY TREATMENT       Patient Name: Marie Hebert MRN: [id number] DOB:1933-03-01, 89 y.o., female Today's Date: 08/02/2022  END OF SESSION:  PT End of Session - 08/02/22 1350     Visit Number 11    Date for PT Re-Evaluation 08/28/22    Authorization Type [employer] MCR    Progress Note Due on Visit 20    PT Start Time 7353    PT Stop Time 1447    PT Time Calculation (min) 54 min    Activity Tolerance Patient tolerated treatment well    Behavior During Therapy WFL for tasks assessed/performed                   Past Medical History:  Diagnosis Date   Back pain    Dizziness    Hypertension    Palpitations    Past Surgical History:  Procedure Laterality Date   CATARACT EXTRACTION  2010   right   COLONOSCOPY  2003   LAPAROSCOPIC APPENDECTOMY N/A 09/25/2020   Procedure: APPENDECTOMY LAPAROSCOPIC;  Surgeon: Mickeal Skinner, MD;  Location: [place];  Service: General;  Laterality: N/A;   RETINAL LASER PROCEDURE     Patient Active Problem List   Diagnosis Date Noted   Lumbar radiculopathy 07/25/2022   Soft tissue mass 07/25/2022   Osteopenia of neck of right femur 07/25/2022   Early dry stage nonexudative age-related macular degeneration of both eyes 01/10/2022   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 01/10/2022   Status post surgery 09/25/2020   Essential hypertension 07/12/2017   PAC (premature atrial contraction) 07/12/2017   Mitral regurgitation 07/12/2017   Onychomycosis 08/10/2015   PCP NOTES>>> 04/06/2015   Skin lesion 03/18/2013   Annual physical exam 12/31/2011   DJD (degenerative joint disease) 12/31/2011   Vitamin D deficiency 12/31/2011   Recurrent UTI 10/10/2011   PALPITATIONS, OCCASIONAL 10/14/2007   BACK PAIN 09/16/2007    PCP: Colon Branch, MD   REFERRING PROVIDER: Colon Branch, MD   REFERRING DIAG: [id number] (ICD-10-CM) - Right knee pain, unspecified chronicity  THERAPY DIAG:  Chronic pain of  right knee  Stiffness of right knee, not elsewhere classified  Cramp and spasm  Other low back pain  Unsteadiness on feet  Acute pain of right shoulder  Stiffness of right shoulder, not elsewhere classified  Rationale for Evaluation and Treatment: Rehabilitation  ONSET DATE: March/April 2023  SUBJECTIVE:   SUBJECTIVE STATEMENT:  Is waiting to meet in person with MD to talk about her bone density results on 2/28 with Dr.Schmitz. Comes in with R knee pain but states that it is better than before.   PERTINENT HISTORY:  LBP  PAIN:  Are you having pain? Yes: NPRS scale: 4/10 Pain location: behind the R knee Pain description: sometimes dull sometimes sharp Aggravating factors: sleeping, standing  Relieving factors: sitting  PRECAUTIONS: None  WEIGHT BEARING RESTRICTIONS: No  FALLS:  Has patient fallen in last 6 months? No  LIVING ENVIRONMENT: Lives with: lives alone Lives in: House/apartment Stairs: No Has following equipment at home: Single point cane  OCCUPATION: retired  PLOF: Independent  PATIENT GOALS: Get rid of her knee pain and know what is wrong.  NEXT MD VISIT:   OBJECTIVE:   DIAGNOSTIC FINDINGS:  [place] at Dr. Delilah Shan - negative per pt  2022: AP, lateral views of right knee reviewed.  Moderate degenerative changes  with joint space narrowing and subchondral sclerosis noted with marginal  osteophytes in the medial compartment primarily with some findings in the  lateral compartment as well.  No significant patellofemoral arthritis  noted.  No fracture or dislocation.   PATIENT SURVEYS:  LEFS 19 / 80 = 23.8 %  COGNITION: Overall cognitive status: Within functional limits for tasks assessed     SENSATION: WFL  MUSCLE LENGTH: HS: mild tightness Quads: mild tightness ITB: tight R Piriformis: mild R Hip Flexors: mild R Heelcords: mild R   POSTURE: No Significant postural limitations  PALPATION: Palpation: Marked TTP at R gastroc, HS,  quads, ITB, R gluteals. Increased tone in post knee. Patellar Mobility: WNL bil  LOWER EXTREMITY ROM:  A/P ROM Right eval Left eval Right  AROM 07/03/2022 Right  AROM 2/52024  Hip flexion [facility] WFL    Knee flexion 103/116* 125 105 106  Knee extension -5/-2 full -5 -2   (Blank rows = not tested) * pain  LOWER EXTREMITY MMT:  MMT Right eval Left eval  Hip flexion 4   Hip extension 5   Hip abduction 5   Hip adduction    Hip internal rotation    Hip external rotation    Knee flexion 5 mild pain   Knee extension 5   Ankle dorsiflexion 5   Ankle plantarflexion    Ankle inversion    Ankle eversion     (Blank rows = not tested)   FUNCTIONAL TESTS:  Difficulty with sit to stand transfer due to knee pain  GAIT: Distance walked:  40 Assistive device utilized: Single point cane Level of assistance: Modified independence Comments: antalgic, uses cane in R UE; cane adjusted to correct height  06/05/22 Low back assessed:  Full functional ROM, pain with UPA and CPA mobs to bil L3/4, L4/5, L5/S1 R >L - Pain decreases with repeated mobs. Marked tightness in bil gluteals, piriformis and lumbar paraspinals  R>L. Patient limited with prone press ups due to arm weakness.   07/24/2022 SHOULDER ASSESSMENT Posture: forward head, rounded shoulders  CERVICAL ROM:  AROM 07/24/2022  Cervical Flexion 30  Cervical Extension 10  Cervical Rotation to Left 45  Cervical Rotation to Right 45   UPPER EXTREMITY MMT:  MMT Right 07/24/2022 Left 07/24/2022  Shoulder flexion 4 5  Shoulder extension    Shoulder abduction 4 5  Shoulder internal rotation 5 5  Shoulder external rotation 5 5  Elbow flexion 5 5  Elbow extension 5 5  Wrist flexion 5 5  Wrist extension 5 5  Grip strength good good   (Blank rows = not tested)  UPPER EXTREMITY ROM:  Active ROM Right eval Left eval  Shoulder flexion 90 (P 150)  130  Shoulder extension 40 62  Shoulder abduction 90 145  Shoulder internal  rotation Functional low back ; 50  Functional to upper back T6; 80  Shoulder external rotation 70 85   (Blank rows = not tested) Right hand dominant  Palpation: tenderness/tightness R upper trap, R levator scapulae, R cervical paraspinals, R supraspinatus  DGI: 19/24 on 07/30/22 Gait x 600 feet  - gait speed of 2.7 ft/sec on 07/30/22 Stairs - reciprocal gait ascending with required one UE support; descent step to gait and one UE due to pain in R knee.  TODAY'S TREATMENT:  DATE:    08/02/22 THERAPEUTIC EXERCISE: to improve flexibility, strength and mobility.  Verbal and tactile cues throughout for technique. NuStep L3 x59mn  Chin Tucks seated x10 5" hold  Scapular Retractions x10 Standing Rows RTB x10  ER with RTB x10  UT Stx 2x30" bilat  LS Stx 1x30" bilat  Seated Shoulder Horizontal Abduction x10 RTB   MANUAL THERAPY: To promote normalized muscle tension, improved flexibility, improved joint mobility, increased ROM, and reduced pain. Skilled palpation and monitoring of soft tissue during DN Trigger Point Dry-Needling  Treatment instructions: Expect mild to moderate muscle soreness. S/S of pneumothorax if dry needled over a lung field, and to seek immediate medical attention should they occur. Patient verbalized understanding of these instructions and education. Patient Consent Given: Yes Education handout provided: Previously provided Muscles treated: R: UT, LS, Scalenes Electrical stimulation performed: No Parameters: N/A Treatment response/outcome: Twitch Response Elicited and Palpable Increase in Muscle Length STM/DTM, manual TPR and pin & stretch to muscles addressed with DN   07/30/22 Therapeutic Activiites: Gait x 600 feet  - gait speed of 2.7 ft/sec DGI completed R knee ROM assessed see objective  Therapeutic Exercise: to improve strength and  mobility.  Demo, verbal and tactile cues throughout for technique. SLR x 10 R  Self Care: Reviewed importance of posture and head position Discussed use of Advil regularly to address pain in order to be able to tolerate exercise which may benefit knee and eliminate need for meds. Patient to try x 1 week.  Manual Therapy: to decrease muscle spasm and pain and improve mobility STM to R UT, cervical paraspinals, skilled palpation and monitoring during dry needling. Trigger Point Dry-Needling  Treatment instructions: Expect mild to moderate muscle soreness. S/S of pneumothorax if dry needled over a lung field, and to seek immediate medical attention should they occur. Patient verbalized understanding of these instructions and education. Patient Consent Given: Yes Education handout provided: Previously provided Muscles treated: R UT, cervical multifidi C2 and C3, lats  Electrical stimulation performed: No Parameters: N/A Treatment response/outcome: Twitch Response Elicited and Palpable Increase in Muscle Length   07/24/22 Therapeutic Exercise: to improve strength and mobility.  Demo, verbal and tactile cues throughout for technique. Nustep [id number] Standing hip abduction x 10 bil- pain with L side Standing hip extension x 10 bil Standing HS curls x 10 bil Therapeutic Activity:  assessment of shoulder and neck, see notes above.  Manual Therapy: to decrease muscle spasm and pain and improve mobility STM/TPR to R UT, L/S, cervical paraspinals, skilled palpation and monitoring during dry needling. Trigger Point Dry-Needling  Treatment instructions: Expect mild to moderate muscle soreness. S/S of pneumothorax if dry needled over a lung field, and to seek immediate medical attention should they occur. Patient verbalized understanding of these instructions and education. Patient Consent Given: Yes Education handout provided: Previously provided Muscles treated: R UT Electrical stimulation  performed: No Parameters: N/A Treatment response/outcome: Twitch Response Elicited and Palpable Increase in Muscle Length    PATIENT EDUCATION:  Education details: progress with PT and HEP update  Patient Person educated: Patient Education method: Explanation, Demonstration, Tactile cues, Verbal cues, and Handouts Education comprehension: verbalized understanding and returned demonstration  HOME EXERCISE PROGRAM: Access Code: [email or web address]: [email or web address] Date: 08/02/2022 Prepared by: VeVerdene Lennertomero-Perozo  Exercises - Long Sitting Calf Stretch with Strap  - 2 x daily - 7 x weekly - 1 sets - 3 reps - 30 sec hold - Modified Thomas Stretch  - 1 x daily - 7  x weekly - 1 sets - 3 reps - 30 sec hold - ITB Stretch at Wall (Mirrored)  - 1 x daily - 7 x weekly - 1 sets - 3 reps - 30-60 hold - Standing Hip Extension with Counter Support  - 1 x daily - 3-4 x weekly - 2 sets - 10 reps - Standing Hip Abduction with Counter Support  - 1 x daily - 3-4 x weekly - 2 sets - 10 reps - Active Straight Leg Raise with Quad Set  - 1 x daily - 7 x weekly - 2 sets - 10 reps - Seated Passive Cervical Retraction  - 1 x daily - 7 x weekly - 1 sets - 10 reps - 5 second hold - Seated Scapular Retraction  - 1 x daily - 7 x weekly - 2 sets - 10 reps - 5 seconds hold - Shoulder External Rotation and Scapular Retraction with Resistance  - 1 x daily - 3-4 x weekly - 3 sets - 10 reps - 3-5 seconds hold - Scapular Retraction with Resistance  - 1 x daily - 3-4 x weekly - 3 sets - 10 reps - 3-5 seconds hold - Seated Upper Trapezius Stretch  - 1 x daily - 7 x weekly - 2 sets - 30 second hold - Seated Levator Scapulae Stretch  - 1 x daily - 7 x weekly - 2 sets - 30 second hold - Seated Shoulder Horizontal Abduction with Resistance  - 1 x daily - 3-4 x weekly - 2 sets - 10 reps - 3-5 seconds hold   ASSESSMENT:  CLINICAL IMPRESSION:  Pt came into today's session with a 4/10 pain on R knee. Pt denies  any pain on her shoulder and understood that after last session, there is going to be a shift into focusing on postural strengthening and shoulder ROM, but continuing to address the knee when needed. Education on posture was provided again and pt verbalized understatement on her ongoing POC. Pt responded well to the new exercises and was given an updated HEP to continue working on her posture. DN was conducted to decrease the muscle tension she exhibited while performing postural exercises and to hopefully decrease any TrP she has. Pt continues to demonstrate potential for improvement and would benefit from continued skilled therapy to address her impairments seen above.   OBJECTIVE IMPAIRMENTS: Abnormal gait, decreased balance, decreased ROM, decreased strength, increased fascial restrictions, increased muscle spasms, impaired flexibility, obesity, and pain.   ACTIVITY LIMITATIONS: standing, transfers, and locomotion level  PARTICIPATION LIMITATIONS: meal prep and community activity  PERSONAL FACTORS: Time since onset of injury/illness/exacerbation and 1 comorbidity: LBP  are also affecting patient's functional outcome.   REHAB POTENTIAL: Excellent  CLINICAL DECISION MAKING: Stable/uncomplicated  EVALUATION COMPLEXITY: Low   GOALS: Goals reviewed with patient? Yes  SHORT TERM GOALS: Target date: 06/06/2022   Patient will be independent with initial HEP. Baseline:  Goal status: MET  2.  Balance assesment completed Baseline:  Goal status: MET    LONG TERM GOALS: Target date: 07/04/2022 extended to 08/14/2022.   Patient will be independent with advanced/ongoing HEP to improve outcomes and carryover.  Baseline:  Goal status: IN PROGRESS 07/30/22- not fully compliant due to knee pain  2.  Patient will report at least 75% improvement in R knee pain to improve QOL. Baseline:  Goal status: IN PROGRESS- 07/30/22 - reports 2% improvement  3.  Patient will demonstrate improved R knee AROM to  >/= 2--120 deg to allow  for normal gait and stair mechanics. Baseline:  Goal status: IN PROGRESS 07/30/22 -see objective.   4.  Patient will demonstrate improved functional LE strength as demonstrated by being able to complete 5xSTS in <=14.8 sec. Baseline:  Goal status: MET 07/03/2022- 12.4 seconds, with increased knee pain.   5.  Patient will be able to ambulate 600' with LRAD and normal gait pattern without increased pain to access community.  Baseline:  Goal status: IN PROGRESS 07/03/22- noted increased gait deviation after 450', 07/30/22 - able to amb 600 ft with mild gait deviations deviating from straight path and decreased heel strike bil as well as decreased step length L.  6. Patient will be stand to cook meals without increased R knee pain. Baseline:  Goal status: IN PROGRESS 07/30/22 limited to 10-15 min  7.  Patient will report 28/80 on LEFS  to demonstrate improved functional ability. Baseline: 19 / 80 = 23.8 % at eval, 29 / 80 = 36.3 % on 07/30/22 Goal status: MET  8.  Patient will demonstrate at least 19/24 on DGI to decrease risk of falls. Baseline: 17/24, 19/24 on 07/30/22 Goal status: MET   9.  Patient will be able to sleep without waking from knee pain Baseline:  Goal status: IN PROGRESS  10.  Patient will report 50% improvement in Right shoulder pain to improve QOL.  Baseline:  Goal status: IN PROGRESS   11.  Patient will report 10% improvement on [employer].  Baseline: 38.6 / 100 = 38.6 %  Goal status: IN PROGRESS       PLAN:  PT FREQUENCY: 2x/week  PT DURATION: 6 weeks  PLANNED INTERVENTIONS: Therapeutic exercises, Therapeutic activity, Neuromuscular re-education, Balance training, Gait training, Patient/Family education, Self Care, Joint mobilization, Aquatic Therapy, Dry Needling, Electrical stimulation, Spinal mobilization, Cryotherapy, Moist heat, Taping, Ultrasound, Ionotophoresis '4mg'$ /ml Dexamethasone, and Manual therapy  PLAN FOR NEXT SESSION:  Ask how  new HEP went, continue postural strengthening, R shoulder strengthening, manual therapy, TrDN.     Fabrizzio Marcella Romero-Perozo, Student-PT 08/02/2022, 2:47 PM

## 2022-08-06 ENCOUNTER — Ambulatory Visit: Payer: 59 | Admitting: Physical Therapy

## 2022-08-06 ENCOUNTER — Encounter: Payer: Self-pay | Admitting: Physical Therapy

## 2022-08-06 DIAGNOSIS — M25561 Pain in right knee: Secondary | ICD-10-CM | POA: Diagnosis not present

## 2022-08-06 DIAGNOSIS — M25661 Stiffness of right knee, not elsewhere classified: Secondary | ICD-10-CM | POA: Diagnosis not present

## 2022-08-06 DIAGNOSIS — G8929 Other chronic pain: Secondary | ICD-10-CM

## 2022-08-06 DIAGNOSIS — R252 Cramp and spasm: Secondary | ICD-10-CM

## 2022-08-06 DIAGNOSIS — M25611 Stiffness of right shoulder, not elsewhere classified: Secondary | ICD-10-CM | POA: Diagnosis not present

## 2022-08-06 DIAGNOSIS — M5459 Other low back pain: Secondary | ICD-10-CM | POA: Diagnosis not present

## 2022-08-06 DIAGNOSIS — M25511 Pain in right shoulder: Secondary | ICD-10-CM | POA: Diagnosis not present

## 2022-08-06 DIAGNOSIS — R2681 Unsteadiness on feet: Secondary | ICD-10-CM | POA: Diagnosis not present

## 2022-08-06 NOTE — Therapy (Signed)
OUTPATIENT PHYSICAL THERAPY TREATMENT       Patient Name: Marie Hebert MRN: [id number] DOB:06/25/1933, 89 y.o., female Today's Date: 08/06/2022  END OF SESSION:  PT End of Session - 08/06/22 1322     Visit Number 12    Date for PT Re-Evaluation 08/28/22    Authorization Type [employer] MCR    Progress Note Due on Visit 20    PT Start Time 1316    PT Stop Time 1358    PT Time Calculation (min) 42 min    Activity Tolerance Patient tolerated treatment well    Behavior During Therapy WFL for tasks assessed/performed                   Past Medical History:  Diagnosis Date   Back pain    Dizziness    Hypertension    Palpitations    Past Surgical History:  Procedure Laterality Date   CATARACT EXTRACTION  2010   right   COLONOSCOPY  2003   LAPAROSCOPIC APPENDECTOMY N/A 09/25/2020   Procedure: APPENDECTOMY LAPAROSCOPIC;  Surgeon: Mickeal Skinner, MD;  Location: [facility];  Service: General;  Laterality: N/A;   RETINAL LASER PROCEDURE     Patient Active Problem List   Diagnosis Date Noted   Lumbar radiculopathy 07/25/2022   Soft tissue mass 07/25/2022   Osteopenia of neck of right femur 07/25/2022   Early dry stage nonexudative age-related macular degeneration of both eyes 01/10/2022   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 01/10/2022   Status post surgery 09/25/2020   Essential hypertension 07/12/2017   PAC (premature atrial contraction) 07/12/2017   Mitral regurgitation 07/12/2017   Onychomycosis 08/10/2015   PCP NOTES>>> 04/06/2015   Skin lesion 03/18/2013   Annual physical exam 12/31/2011   DJD (degenerative joint disease) 12/31/2011   Vitamin D deficiency 12/31/2011   Recurrent UTI 10/10/2011   PALPITATIONS, OCCASIONAL 10/14/2007   BACK PAIN 09/16/2007    PCP: Colon Branch, MD   REFERRING PROVIDER: Colon Branch, MD   REFERRING DIAG: [phone] (ICD-10-CM) - Right knee pain, unspecified chronicity  THERAPY DIAG:  Chronic pain of  right knee  Stiffness of right knee, not elsewhere classified  Cramp and spasm  Other low back pain  Rationale for Evaluation and Treatment: Rehabilitation  ONSET DATE: March/April 2023  SUBJECTIVE:   SUBJECTIVE STATEMENT:  Marie Hebert reports her shoulder is feeling better, has been doing new exercises, feels tired afterwards.  Worried about the osteopenia in her hips.    PERTINENT HISTORY:  LBP  PAIN:  Are you having pain? Yes: NPRS scale: 4/10 Pain location: R buttock to behind the R knee Pain description: sometimes dull sometimes sharp Aggravating factors: sleeping, standing  Relieving factors: sitting  PRECAUTIONS: None  WEIGHT BEARING RESTRICTIONS: No  FALLS:  Has patient fallen in last 6 months? No  LIVING ENVIRONMENT: Lives with: lives alone Lives in: House/apartment Stairs: No Has following equipment at home: Single point cane  OCCUPATION: retired  PLOF: Independent  PATIENT GOALS: Get rid of her knee pain and know what is wrong.  NEXT MD VISIT:   OBJECTIVE:   DIAGNOSTIC FINDINGS:  [place] at Dr. Delilah Shan - negative per pt  2022: AP, lateral views of right knee reviewed.  Moderate degenerative changes  with joint space narrowing and subchondral sclerosis noted with marginal  osteophytes in the medial compartment primarily with some findings in the  lateral compartment as well.  No significant patellofemoral arthritis  noted.  No fracture or dislocation.   PATIENT SURVEYS:  LEFS 19 / 80 = 23.8 %  COGNITION: Overall cognitive status: Within functional limits for tasks assessed     SENSATION: WFL  MUSCLE LENGTH: HS: mild tightness Quads: mild tightness ITB: tight R Piriformis: mild R Hip Flexors: mild R Heelcords: mild R   POSTURE: No Significant postural limitations  PALPATION: Palpation: Marked TTP at R gastroc, HS, quads, ITB, R gluteals. Increased tone in post knee. Patellar Mobility: WNL bil  LOWER EXTREMITY ROM:  A/P  ROM Right eval Left eval Right  AROM 07/03/2022 Right  AROM 2/52024  Hip flexion [facility] WFL    Knee flexion 103/116* 125 105 106  Knee extension -5/-2 full -5 -2   (Blank rows = not tested) * pain  LOWER EXTREMITY MMT:  MMT Right eval Left eval  Hip flexion 4   Hip extension 5   Hip abduction 5   Hip adduction    Hip internal rotation    Hip external rotation    Knee flexion 5 mild pain   Knee extension 5   Ankle dorsiflexion 5   Ankle plantarflexion    Ankle inversion    Ankle eversion     (Blank rows = not tested)   FUNCTIONAL TESTS:  Difficulty with sit to stand transfer due to knee pain  GAIT: Distance walked:  40 Assistive device utilized: Single point cane Level of assistance: Modified independence Comments: antalgic, uses cane in R UE; cane adjusted to correct height  06/05/22 Low back assessed:  Full functional ROM, pain with UPA and CPA mobs to bil L3/4, L4/5, L5/S1 R >L - Pain decreases with repeated mobs. Marked tightness in bil gluteals, piriformis and lumbar paraspinals  R>L. Patient limited with prone press ups due to arm weakness.   07/24/2022 SHOULDER ASSESSMENT Posture: forward head, rounded shoulders  CERVICAL ROM:  AROM 07/24/2022  Cervical Flexion 30  Cervical Extension 10  Cervical Rotation to Left 45  Cervical Rotation to Right 45   UPPER EXTREMITY MMT:  MMT Right 07/24/2022 Left 07/24/2022  Shoulder flexion 4 5  Shoulder extension    Shoulder abduction 4 5  Shoulder internal rotation 5 5  Shoulder external rotation 5 5  Elbow flexion 5 5  Elbow extension 5 5  Wrist flexion 5 5  Wrist extension 5 5  Grip strength good good   (Blank rows = not tested)  UPPER EXTREMITY ROM:  Active ROM Right eval Left eval  Shoulder flexion 90 (P 150)  130  Shoulder extension 40 62  Shoulder abduction 90 145  Shoulder internal rotation Functional low back ; 50  Functional to upper back T6; 80  Shoulder external rotation 70 85   (Blank rows =  not tested) Right hand dominant  Palpation: tenderness/tightness R upper trap, R levator scapulae, R cervical paraspinals, R supraspinatus  DGI: 19/24 on 07/30/22 Gait x 600 feet  - gait speed of 2.7 ft/sec on 07/30/22 Stairs - reciprocal gait ascending with required one UE support; descent step to gait and one UE due to pain in R knee.  TODAY'S TREATMENT:  DATE:   08/06/22 Therapeutic Exercise: to improve strength and mobility.  Demo, verbal and tactile cues throughout for technique. UBE forward x 6 min level 1 Review of HEP - chin tucks, scapular retraction, rows, bil ER (without band today), UT and LS stretches.  Manual Therapy: to decrease muscle spasm and pain and improve mobility STM/TPR to cervical paraspinals, bil UT and levator scapulae, IASTM with foam roller to R glutes/piriformis and proximal hamstrings, skilled palpation and monitoring during dry needling. Trigger Point Dry-Needling  Treatment instructions: Expect mild to moderate muscle soreness. S/S of pneumothorax if dry needled over a lung field, and to seek immediate medical attention should they occur. Patient verbalized understanding of these instructions and education. Patient Consent Given: Yes Education handout provided: Previously provided Muscles treated: R UT, R L/S, bil C5-6 multifidi, R glut med, R piriformis Electrical stimulation performed: No Parameters: N/A Treatment response/outcome: Twitch Response Elicited and Palpable Increase in Muscle Length  08/02/22 THERAPEUTIC EXERCISE: to improve flexibility, strength and mobility.  Verbal and tactile cues throughout for technique. NuStep L3 x43mn  Chin Tucks seated x10 5" hold  Scapular Retractions x10 Standing Rows RTB x10  ER with RTB x10  UT Stx 2x30" bilat  LS Stx 1x30" bilat  Seated Shoulder Horizontal Abduction x10 RTB   MANUAL  THERAPY: To promote normalized muscle tension, improved flexibility, improved joint mobility, increased ROM, and reduced pain. Skilled palpation and monitoring of soft tissue during DN Trigger Point Dry-Needling  Treatment instructions: Expect mild to moderate muscle soreness. S/S of pneumothorax if dry needled over a lung field, and to seek immediate medical attention should they occur. Patient verbalized understanding of these instructions and education. Patient Consent Given: Yes Education handout provided: Previously provided Muscles treated: R: UT, LS, [place] Electrical stimulation performed: No Parameters: N/A Treatment response/outcome: Twitch Response Elicited and Palpable Increase in Muscle Length STM/DTM, manual TPR and pin & stretch to muscles addressed with DN   07/30/22 Therapeutic Activiites: Gait x 600 feet  - gait speed of 2.7 ft/sec DGI completed R knee ROM assessed see objective  Therapeutic Exercise: to improve strength and mobility.  Demo, verbal and tactile cues throughout for technique. SLR x 10 R  Self Care: Reviewed importance of posture and head position Discussed use of Advil regularly to address pain in order to be able to tolerate exercise which may benefit knee and eliminate need for meds. Patient to try x 1 week.  Manual Therapy: to decrease muscle spasm and pain and improve mobility STM to R UT, cervical paraspinals, skilled palpation and monitoring during dry needling. Trigger Point Dry-Needling  Treatment instructions: Expect mild to moderate muscle soreness. S/S of pneumothorax if dry needled over a lung field, and to seek immediate medical attention should they occur. Patient verbalized understanding of these instructions and education. Patient Consent Given: Yes Education handout provided: Previously provided Muscles treated: R UT, cervical multifidi C2 and C3, lats  Electrical stimulation performed: No Parameters: N/A Treatment response/outcome:  Twitch Response Elicited and Palpable Increase in Muscle Length  PATIENT EDUCATION:  Education details: progress with PT and HEP update  Patient Person educated: Patient Education method: Explanation, Demonstration, Tactile cues, Verbal cues, and Handouts Education comprehension: verbalized understanding and returned demonstration  HOME EXERCISE PROGRAM: Access Code: [email or web address]: [email or web address] Date: 08/02/2022 Prepared by: VVerdene LennertRomero-Perozo  Exercises - Long Sitting Calf Stretch with Strap  - 2 x daily - 7 x weekly - 1 sets - 3 reps - 30 sec hold - Modified TMarcello Moores  Stretch  - 1 x daily - 7 x weekly - 1 sets - 3 reps - 30 sec hold - ITB Stretch at Wall (Mirrored)  - 1 x daily - 7 x weekly - 1 sets - 3 reps - 30-60 hold - Standing Hip Extension with Counter Support  - 1 x daily - 3-4 x weekly - 2 sets - 10 reps - Standing Hip Abduction with Counter Support  - 1 x daily - 3-4 x weekly - 2 sets - 10 reps - Active Straight Leg Raise with Quad Set  - 1 x daily - 7 x weekly - 2 sets - 10 reps - Seated Passive Cervical Retraction  - 1 x daily - 7 x weekly - 1 sets - 10 reps - 5 second hold - Seated Scapular Retraction  - 1 x daily - 7 x weekly - 2 sets - 10 reps - 5 seconds hold - Shoulder External Rotation and Scapular Retraction with Resistance  - 1 x daily - 3-4 x weekly - 3 sets - 10 reps - 3-5 seconds hold - Scapular Retraction with Resistance  - 1 x daily - 3-4 x weekly - 3 sets - 10 reps - 3-5 seconds hold - Seated Upper Trapezius Stretch  - 1 x daily - 7 x weekly - 2 sets - 30 second hold - Seated Levator Scapulae Stretch  - 1 x daily - 7 x weekly - 2 sets - 30 second hold - Seated Shoulder Horizontal Abduction with Resistance  - 1 x daily - 3-4 x weekly - 2 sets - 10 reps - 3-5 seconds hold   ASSESSMENT:  CLINICAL IMPRESSION:  Marie Hebert reports good compliance with new HEP for postural strengthening, reports it is fatiguing, but was able to  recall all exercises and demonstrate with good technique today, reporting decreased tightness and spasm in R shoulder and less complaint of parasthesias now.  She reports continued pain along R sciatic nerve distribution, so focused on this area again with manual therapy, after which reported decreased pain and tightness.  Encouraged to continue walking to build/maintain bone health with diagnosis of osteopenia.  Marie Hebert continues to demonstrate potential for improvement and would benefit from continued skilled therapy to address impairments.      OBJECTIVE IMPAIRMENTS: Abnormal gait, decreased balance, decreased ROM, decreased strength, increased fascial restrictions, increased muscle spasms, impaired flexibility, obesity, and pain.   ACTIVITY LIMITATIONS: standing, transfers, and locomotion level  PARTICIPATION LIMITATIONS: meal prep and community activity  PERSONAL FACTORS: Time since onset of injury/illness/exacerbation and 1 comorbidity: LBP  are also affecting patient's functional outcome.   REHAB POTENTIAL: Excellent  CLINICAL DECISION MAKING: Stable/uncomplicated  EVALUATION COMPLEXITY: Low   GOALS: Goals reviewed with patient? Yes  SHORT TERM GOALS: Target date: 06/06/2022   Patient will be independent with initial HEP. Baseline:  Goal status: MET  2.  Balance assesment completed Baseline:  Goal status: MET    LONG TERM GOALS: Target date: 07/04/2022 extended to 08/28/2022.   Patient will be independent with advanced/ongoing HEP to improve outcomes and carryover.  Baseline:  Goal status: IN PROGRESS 07/30/22- not fully compliant due to knee pain  2.  Patient will report at least 75% improvement in R knee pain to improve QOL. Baseline:  Goal status: IN PROGRESS- 07/30/22 - reports 2% improvement  3.  Patient will demonstrate improved R knee AROM to >/= 2--120 deg to allow for normal gait and stair mechanics. Baseline:  Goal status: IN  PROGRESS 07/30/22 -see  objective.   4.  Patient will demonstrate improved functional LE strength as demonstrated by being able to complete 5xSTS in <=14.8 sec. Baseline:  Goal status: MET 07/03/2022- 12.4 seconds, with increased knee pain.   5.  Patient will be able to ambulate 600' with LRAD and normal gait pattern without increased pain to access community.  Baseline:  Goal status: IN PROGRESS 07/03/22- noted increased gait deviation after 450', 07/30/22 - able to amb 600 ft with mild gait deviations deviating from straight path and decreased heel strike bil as well as decreased step length L.  6. Patient will be stand to cook meals without increased R knee pain. Baseline:  Goal status: IN PROGRESS 07/30/22 limited to 10-15 min  7.  Patient will report 28/80 on LEFS  to demonstrate improved functional ability. Baseline: 19 / 80 = 23.8 % at eval, 29 / 80 = 36.3 % on 07/30/22 Goal status: MET  8.  Patient will demonstrate at least 19/24 on DGI to decrease risk of falls. Baseline: 17/24, 19/24 on 07/30/22 Goal status: MET   9.  Patient will be able to sleep without waking from knee pain Baseline:  Goal status: IN PROGRESS  10.  Patient will report 50% improvement in Right shoulder pain to improve QOL.  Baseline:  Goal status: IN PROGRESS   11.  Patient will report 10% improvement on [employer].  Baseline: 38.6 / 100 = 38.6 %  Goal status: IN PROGRESS       PLAN:  PT FREQUENCY: 2x/week  PT DURATION: 6 weeks  PLANNED INTERVENTIONS: Therapeutic exercises, Therapeutic activity, Neuromuscular re-education, Balance training, Gait training, Patient/Family education, Self Care, Joint mobilization, Aquatic Therapy, Dry Needling, Electrical stimulation, Spinal mobilization, Cryotherapy, Moist heat, Taping, Ultrasound, Ionotophoresis 56m/ml Dexamethasone, and Manual therapy  PLAN FOR NEXT SESSION:   continue postural strengthening, R shoulder strengthening, manual therapy, TrDN.   LE strengthening also.   ERennie Natter PT, DPT  08/06/2022, 2:08 PM

## 2022-08-09 ENCOUNTER — Ambulatory Visit: Payer: 59 | Admitting: Physical Therapy

## 2022-08-09 ENCOUNTER — Encounter: Payer: Self-pay | Admitting: Physical Therapy

## 2022-08-09 DIAGNOSIS — M25611 Stiffness of right shoulder, not elsewhere classified: Secondary | ICD-10-CM

## 2022-08-09 DIAGNOSIS — M5459 Other low back pain: Secondary | ICD-10-CM

## 2022-08-09 DIAGNOSIS — M25661 Stiffness of right knee, not elsewhere classified: Secondary | ICD-10-CM

## 2022-08-09 DIAGNOSIS — G8929 Other chronic pain: Secondary | ICD-10-CM

## 2022-08-09 DIAGNOSIS — R252 Cramp and spasm: Secondary | ICD-10-CM | POA: Diagnosis not present

## 2022-08-09 DIAGNOSIS — R2681 Unsteadiness on feet: Secondary | ICD-10-CM

## 2022-08-09 DIAGNOSIS — M25511 Pain in right shoulder: Secondary | ICD-10-CM | POA: Diagnosis not present

## 2022-08-09 DIAGNOSIS — M25561 Pain in right knee: Secondary | ICD-10-CM | POA: Diagnosis not present

## 2022-08-09 NOTE — Therapy (Signed)
OUTPATIENT PHYSICAL THERAPY TREATMENT       Patient Name: Marie Hebert MRN: [id number] DOB:11-03-1932, 89 y.o., female Today's Date: 08/09/2022  END OF SESSION:  PT End of Session - 08/09/22 1405     Visit Number 13    Date for PT Re-Evaluation 08/28/22    Authorization Type [employer] MCR    Progress Note Due on Visit 20    PT Start Time 1405    Activity Tolerance Patient tolerated treatment well    Behavior During Therapy [facility] for tasks assessed/performed                    Past Medical History:  Diagnosis Date   Back pain    Dizziness    Hypertension    Palpitations    Past Surgical History:  Procedure Laterality Date   CATARACT EXTRACTION  2010   right   COLONOSCOPY  2003   LAPAROSCOPIC APPENDECTOMY N/A 09/25/2020   Procedure: APPENDECTOMY LAPAROSCOPIC;  Surgeon: Mickeal Skinner, MD;  Location: [place];  Service: General;  Laterality: N/A;   RETINAL LASER PROCEDURE     Patient Active Problem List   Diagnosis Date Noted   Lumbar radiculopathy 07/25/2022   Soft tissue mass 07/25/2022   Osteopenia of neck of right femur 07/25/2022   Early dry stage nonexudative age-related macular degeneration of both eyes 01/10/2022   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 01/10/2022   Status post surgery 09/25/2020   Essential hypertension 07/12/2017   PAC (premature atrial contraction) 07/12/2017   Mitral regurgitation 07/12/2017   Onychomycosis 08/10/2015   PCP NOTES>>> 04/06/2015   Skin lesion 03/18/2013   Annual physical exam 12/31/2011   DJD (degenerative joint disease) 12/31/2011   Vitamin D deficiency 12/31/2011   Recurrent UTI 10/10/2011   PALPITATIONS, OCCASIONAL 10/14/2007   BACK PAIN 09/16/2007    PCP: Colon Branch, MD   REFERRING PROVIDER: Colon Branch, MD   REFERRING DIAG: [phone] (ICD-10-CM) - Right knee pain, unspecified chronicity  THERAPY DIAG:  Other low back pain  Chronic pain of right knee  Stiffness of right  knee, not elsewhere classified  Cramp and spasm  Rationale for Evaluation and Treatment: Rehabilitation  ONSET DATE: March/April 2023  SUBJECTIVE:   SUBJECTIVE STATEMENT: Pt went to the store yesterday and cut some produce which caused her to have some pain at night in the R shoulder but states that today she feels a lot better. States back is fine and pain free.   PERTINENT HISTORY:  LBP  PAIN:  Are you having pain? No  PRECAUTIONS: None  WEIGHT BEARING RESTRICTIONS: No  FALLS:  Has patient fallen in last 6 months? No  LIVING ENVIRONMENT: Lives with: lives alone Lives in: House/apartment Stairs: No Has following equipment at home: Single point cane  OCCUPATION: retired  PLOF: Independent  PATIENT GOALS: Get rid of her knee pain and know what is wrong.  NEXT MD VISIT: 10/29/22   OBJECTIVE:   DIAGNOSTIC FINDINGS:  [place] at Dr. Delilah Shan - negative per pt  2022: AP, lateral views of right knee reviewed.  Moderate degenerative changes  with joint space narrowing and subchondral sclerosis noted with marginal  osteophytes in the medial compartment primarily with some findings in the  lateral compartment as well.  No significant patellofemoral arthritis  noted.  No fracture or dislocation.   PATIENT SURVEYS:  LEFS 19 / 80 = 23.8 %  COGNITION: Overall cognitive status: Within functional limits for tasks  assessed     SENSATION: WFL  MUSCLE LENGTH: HS: mild tightness Quads: mild tightness ITB: tight R Piriformis: mild R Hip Flexors: mild R Heelcords: mild R   POSTURE: No Significant postural limitations  PALPATION: Palpation: Marked TTP at R gastroc, HS, quads, ITB, R gluteals. Increased tone in post knee. Patellar Mobility: WNL bil  LOWER EXTREMITY ROM:  A/P ROM Right eval Left eval Right  AROM 07/03/2022 Right  AROM 2/52024  Hip flexion [facility] WFL    Knee flexion 103/116* 125 105 106  Knee extension -5/-2 full -5 -2   (Blank rows = not tested) *  pain  LOWER EXTREMITY MMT:  MMT Right eval Left eval  Hip flexion 4   Hip extension 5   Hip abduction 5   Hip adduction    Hip internal rotation    Hip external rotation    Knee flexion 5 mild pain   Knee extension 5   Ankle dorsiflexion 5   Ankle plantarflexion    Ankle inversion    Ankle eversion     (Blank rows = not tested)   FUNCTIONAL TESTS:  Difficulty with sit to stand transfer due to knee pain  GAIT: Distance walked:  40 Assistive device utilized: Single point cane Level of assistance: Modified independence Comments: antalgic, uses cane in R UE; cane adjusted to correct height  06/05/22 Low back assessed:  Full functional ROM, pain with UPA and CPA mobs to bil L3/4, L4/5, L5/S1 R >L - Pain decreases with repeated mobs. Marked tightness in bil gluteals, piriformis and lumbar paraspinals  R>L. Patient limited with prone press ups due to arm weakness.   07/24/2022 SHOULDER ASSESSMENT Posture: forward head, rounded shoulders  CERVICAL ROM:  AROM 07/24/2022  Cervical Flexion 30  Cervical Extension 10  Cervical Rotation to Left 45  Cervical Rotation to Right 45   UPPER EXTREMITY MMT:  MMT Right 07/24/2022 Left 07/24/2022  Shoulder flexion 4 5  Shoulder extension    Shoulder abduction 4 5  Shoulder internal rotation 5 5  Shoulder external rotation 5 5  Elbow flexion 5 5  Elbow extension 5 5  Wrist flexion 5 5  Wrist extension 5 5  Grip strength good good   (Blank rows = not tested)  UPPER EXTREMITY ROM:  Active ROM Right eval Left eval  Shoulder flexion 90 (P 150)  130  Shoulder extension 40 62  Shoulder abduction 90 145  Shoulder internal rotation Functional low back ; 50  Functional to upper back T6; 80  Shoulder external rotation 70 85   (Blank rows = not tested) Right hand dominant  Palpation: tenderness/tightness R upper trap, R levator scapulae, R cervical paraspinals, R supraspinatus  DGI: 19/24 on 07/30/22 Gait x 600 feet  - gait speed  of 2.7 ft/sec on 07/30/22 Stairs - reciprocal gait ascending with required one UE support; descent step to gait and one UE due to pain in R knee.  TODAY'S TREATMENT:  DATE:    08/09/22 THERAPEUTIC EXERCISE: to improve flexibility, strength and mobility.  Verbal and tactile cues throughout for technique. UBE L1 x 6 min forward  Chin Tucks seated x10 5" hold  Seated Rows RTB x10  Seated ER with RTB x10  Seated Shoulder Horizontal Abduction x10 RTB  Seated Diagonals RTB 2x5 bilat   Serratus Wall Slide with towel x10   MANUAL THERAPY: To promote normalized muscle tension, improved flexibility, improved joint mobility, increased ROM, and reduced pain. Skilled palpation and monitoring of soft tissue during DN Trigger Point Dry-Needling  Treatment instructions: Expect mild to moderate muscle soreness. S/S of pneumothorax if dry needled over a lung field, and to seek immediate medical attention should they occur. Patient verbalized understanding of these instructions and education. Patient Consent Given: Yes Education handout provided: Previously provided Muscles treated: R lateral deltoid, anterior deltoid, Teres Major and Minor  Electrical stimulation performed: No Parameters: N/A Treatment response/outcome: Twitch Response Elicited and Palpable Increase in Muscle Length STM/DTM, manual TPR and pin & stretch to muscles addressed with DN   08/06/22  Therapeutic Exercise: to improve strength and mobility.  Demo, verbal and tactile cues throughout for technique. UBE forward x 6 min level 1 Review of HEP - chin tucks, scapular retraction, rows, bil ER (without band today), UT and LS stretches.  Manual Therapy: to decrease muscle spasm and pain and improve mobility STM/TPR to cervical paraspinals, bil UT and levator scapulae, IASTM with foam roller to R glutes/piriformis and  proximal hamstrings, skilled palpation and monitoring during dry needling. Trigger Point Dry-Needling  Treatment instructions: Expect mild to moderate muscle soreness. S/S of pneumothorax if dry needled over a lung field, and to seek immediate medical attention should they occur. Patient verbalized understanding of these instructions and education. Patient Consent Given: Yes Education handout provided: Previously provided Muscles treated: R UT, R L/S, bil C5-6 multifidi, R glut med, R piriformis Electrical stimulation performed: No Parameters: N/A Treatment response/outcome: Twitch Response Elicited and Palpable Increase in Muscle Length   08/02/22 THERAPEUTIC EXERCISE: to improve flexibility, strength and mobility.  Verbal and tactile cues throughout for technique. NuStep L3 x84mn  Chin Tucks seated x10 5" hold  Scapular Retractions x10 Standing Rows RTB x10  ER with RTB x10  UT Stx 2x30" bilat  LS Stx 1x30" bilat  Seated Shoulder Horizontal Abduction x10 RTB   MANUAL THERAPY: To promote normalized muscle tension, improved flexibility, improved joint mobility, increased ROM, and reduced pain. Skilled palpation and monitoring of soft tissue during DN Trigger Point Dry-Needling  Treatment instructions: Expect mild to moderate muscle soreness. S/S of pneumothorax if dry needled over a lung field, and to seek immediate medical attention should they occur. Patient verbalized understanding of these instructions and education. Patient Consent Given: Yes Education handout provided: Previously provided Muscles treated: R: UT, LS, [place] Electrical stimulation performed: No Parameters: N/A Treatment response/outcome: Twitch Response Elicited and Palpable Increase in Muscle Length STM/DTM, manual TPR and pin & stretch to muscles addressed with DN   PATIENT EDUCATION:  Education details: progress with PT and HEP update  Patient Person educated: Patient Education method: Explanation,  Demonstration, Tactile cues, Verbal cues, and Handouts Education comprehension: verbalized understanding and returned demonstration  HOME EXERCISE PROGRAM: Access Code: [email or web address]: [email or web address] Date: 08/02/2022 Prepared by: VVerdene LennertRomero-Perozo  Exercises - Long Sitting Calf Stretch with Strap  - 2 x daily - 7 x weekly - 1 sets - 3 reps - 30 sec hold - Modified [place] -  1 x daily - 7 x weekly - 1 sets - 3 reps - 30 sec hold - ITB Stretch at Wall (Mirrored)  - 1 x daily - 7 x weekly - 1 sets - 3 reps - 30-60 hold - Standing Hip Extension with Counter Support  - 1 x daily - 3-4 x weekly - 2 sets - 10 reps - Standing Hip Abduction with Counter Support  - 1 x daily - 3-4 x weekly - 2 sets - 10 reps - Active Straight Leg Raise with Quad Set  - 1 x daily - 7 x weekly - 2 sets - 10 reps - Seated Passive Cervical Retraction  - 1 x daily - 7 x weekly - 1 sets - 10 reps - 5 second hold - Seated Scapular Retraction  - 1 x daily - 7 x weekly - 2 sets - 10 reps - 5 seconds hold - Shoulder External Rotation and Scapular Retraction with Resistance  - 1 x daily - 3-4 x weekly - 3 sets - 10 reps - 3-5 seconds hold - Scapular Retraction with Resistance  - 1 x daily - 3-4 x weekly - 3 sets - 10 reps - 3-5 seconds hold - Seated Upper Trapezius Stretch  - 1 x daily - 7 x weekly - 2 sets - 30 second hold - Seated Levator Scapulae Stretch  - 1 x daily - 7 x weekly - 2 sets - 30 second hold - Seated Shoulder Horizontal Abduction with Resistance  - 1 x daily - 3-4 x weekly - 2 sets - 10 reps - 3-5 seconds hold   ASSESSMENT:  CLINICAL IMPRESSION:  Pt reports to therapy today with no pain in her shoulder currently, but states anterior shoulder pain has been limiting her sleep. She has been doing her exercises without a problem and reports decreased tightness and spasms as well. Exercises today focused on continued postural awareness and strengthening and pt responded to this well  with minimal discomfort. MT incorporating DN today focused on the R deltoid and the teres group to decrease muscle tension and facilitate movement in the area for improved sleeping tolerance. Pt continues to demonstrate potential for improvement and would benefit from continued skilled therapy to address impairments.    OBJECTIVE IMPAIRMENTS: Abnormal gait, decreased balance, decreased ROM, decreased strength, increased fascial restrictions, increased muscle spasms, impaired flexibility, obesity, and pain.   ACTIVITY LIMITATIONS: standing, transfers, and locomotion level  PARTICIPATION LIMITATIONS: meal prep and community activity  PERSONAL FACTORS: Time since onset of injury/illness/exacerbation and 1 comorbidity: LBP  are also affecting patient's functional outcome.   REHAB POTENTIAL: Excellent  CLINICAL DECISION MAKING: Stable/uncomplicated  EVALUATION COMPLEXITY: Low   GOALS: Goals reviewed with patient? Yes  SHORT TERM GOALS: Target date: 06/06/2022   Patient will be independent with initial HEP. Baseline:  Goal status: MET  2.  Balance assesment completed Baseline:  Goal status: MET  LONG TERM GOALS: Target date: 07/04/2022 extended to 08/28/2022.   Patient will be independent with advanced/ongoing HEP to improve outcomes and carryover.  Baseline:  Goal status: IN PROGRESS 07/30/22- not fully compliant due to knee pain  2.  Patient will report at least 75% improvement in R knee pain to improve QOL. Baseline:  Goal status: IN PROGRESS- 07/30/22 - reports 2% improvement  3.  Patient will demonstrate improved R knee AROM to >/= 2--120 deg to allow for normal gait and stair mechanics. Baseline:  Goal status: IN PROGRESS 07/30/22 -see objective.   4.  Patient will demonstrate improved functional LE strength as demonstrated by being able to complete 5xSTS in <=14.8 sec. Baseline:  Goal status: MET 07/03/2022- 12.4 seconds, with increased knee pain.   5.  Patient will be able to  ambulate 600' with LRAD and normal gait pattern without increased pain to access community.  Baseline:  Goal status: IN PROGRESS 07/03/22- noted increased gait deviation after 450', 07/30/22 - able to amb 600 ft with mild gait deviations deviating from straight path and decreased heel strike bil as well as decreased step length L.  6. Patient will be stand to cook meals without increased R knee pain. Baseline:  Goal status: IN PROGRESS 07/30/22 limited to 10-15 min  7.  Patient will report 28/80 on LEFS  to demonstrate improved functional ability. Baseline: 19 / 80 = 23.8 % at eval, 29 / 80 = 36.3 % on 07/30/22 Goal status: MET  8.  Patient will demonstrate at least 19/24 on DGI to decrease risk of falls. Baseline: 17/24, 19/24 on 07/30/22 Goal status: MET   9.  Patient will be able to sleep without waking from knee pain Baseline:  Goal status: IN PROGRESS  10.  Patient will report 50% improvement in Right shoulder pain to improve QOL.  Baseline:  Goal status: IN PROGRESS   11.  Patient will report 10% improvement on [employer].  Baseline: 38.6 / 100 = 38.6 %  Goal status: IN PROGRESS   PLAN:  PT FREQUENCY: 2x/week  PT DURATION: 6 weeks  PLANNED INTERVENTIONS: Therapeutic exercises, Therapeutic activity, Neuromuscular re-education, Balance training, Gait training, Patient/Family education, Self Care, Joint mobilization, Aquatic Therapy, Dry Needling, Electrical stimulation, Spinal mobilization, Cryotherapy, Moist heat, Taping, Ultrasound, Ionotophoresis 73m/ml Dexamethasone, and Manual therapy  PLAN FOR NEXT SESSION: Update/Review HEP as appropriate, continue postural strengthening, R shoulder strengthening, manual therapy, TrDN.   LE strengthening also.   Raechel Marcos Romero-Perozo, Student-PT, DPT  08/09/2022, 2:38 PM

## 2022-08-14 ENCOUNTER — Ambulatory Visit: Payer: 59 | Admitting: Physical Therapy

## 2022-08-14 ENCOUNTER — Encounter: Payer: Self-pay | Admitting: Physical Therapy

## 2022-08-14 DIAGNOSIS — M25511 Pain in right shoulder: Secondary | ICD-10-CM | POA: Diagnosis not present

## 2022-08-14 DIAGNOSIS — G8929 Other chronic pain: Secondary | ICD-10-CM

## 2022-08-14 DIAGNOSIS — R2681 Unsteadiness on feet: Secondary | ICD-10-CM | POA: Diagnosis not present

## 2022-08-14 DIAGNOSIS — M25611 Stiffness of right shoulder, not elsewhere classified: Secondary | ICD-10-CM

## 2022-08-14 DIAGNOSIS — R252 Cramp and spasm: Secondary | ICD-10-CM | POA: Diagnosis not present

## 2022-08-14 DIAGNOSIS — M25661 Stiffness of right knee, not elsewhere classified: Secondary | ICD-10-CM | POA: Diagnosis not present

## 2022-08-14 DIAGNOSIS — M25561 Pain in right knee: Secondary | ICD-10-CM | POA: Diagnosis not present

## 2022-08-14 DIAGNOSIS — M5459 Other low back pain: Secondary | ICD-10-CM | POA: Diagnosis not present

## 2022-08-14 NOTE — Therapy (Signed)
OUTPATIENT PHYSICAL THERAPY TREATMENT       Patient Name: Marie Hebert MRN: [id number] DOB:[birth year], 89 y.o., female Today's Date: 08/14/2022  END OF SESSION:  PT End of Session - 08/14/22 1320     Visit Number 14    Date for PT Re-Evaluation 08/28/22    Authorization Type [employer] MCR    Progress Note Due on Visit 20    PT Start Time [id number]    PT Stop Time 1402    PT Time Calculation (min) 45 min    Activity Tolerance Patient tolerated treatment well    Behavior During Therapy WFL for tasks assessed/performed                    Past Medical History:  Diagnosis Date   Back pain    Dizziness    Hypertension    Palpitations    Past Surgical History:  Procedure Laterality Date   CATARACT EXTRACTION  2010   right   COLONOSCOPY  2003   LAPAROSCOPIC APPENDECTOMY N/A 09/25/2020   Procedure: APPENDECTOMY LAPAROSCOPIC;  Surgeon: Mickeal Skinner, MD;  Location: [place];  Service: General;  Laterality: N/A;   RETINAL LASER PROCEDURE     Patient Active Problem List   Diagnosis Date Noted   Lumbar radiculopathy 07/25/2022   Soft tissue mass 07/25/2022   Osteopenia of neck of right femur 07/25/2022   Early dry stage nonexudative age-related macular degeneration of both eyes 01/10/2022   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 01/10/2022   Status post surgery 09/25/2020   Essential hypertension 07/12/2017   PAC (premature atrial contraction) 07/12/2017   Mitral regurgitation 07/12/2017   Onychomycosis 08/10/2015   PCP NOTES>>> 04/06/2015   Skin lesion 03/18/2013   Annual physical exam 12/31/2011   DJD (degenerative joint disease) 12/31/2011   Vitamin D deficiency 12/31/2011   Recurrent UTI 10/10/2011   PALPITATIONS, OCCASIONAL 10/14/2007   BACK PAIN 09/16/2007    PCP: Colon Branch, MD   REFERRING PROVIDER: Colon Branch, MD   REFERRING DIAG: [phone] (ICD-10-CM) - Right knee pain, unspecified chronicity  THERAPY DIAG:  Chronic pain of  right knee  Stiffness of right knee, not elsewhere classified  Cramp and spasm  Unsteadiness on feet  Other low back pain  Acute pain of right shoulder  Stiffness of right shoulder, not elsewhere classified  Rationale for Evaluation and Treatment: Rehabilitation  ONSET DATE: March/April 2023  SUBJECTIVE:   SUBJECTIVE STATEMENT:  [name] reports that her shoulder is much better than before, no pain today, occasionally has pain reaching up to mouth.  Knee pain seems to be better, it comes and goes, more problems at night in bed.   PERTINENT HISTORY:  LBP  PAIN:  Are you having pain? No  PRECAUTIONS: None  WEIGHT BEARING RESTRICTIONS: No  FALLS:  Has patient fallen in last 6 months? No  LIVING ENVIRONMENT: Lives with: lives alone Lives in: House/apartment Stairs: No Has following equipment at home: Single point cane  OCCUPATION: retired  PLOF: Independent  PATIENT GOALS: Get rid of her knee pain and know what is wrong.  NEXT MD VISIT: 10/29/22   OBJECTIVE:   DIAGNOSTIC FINDINGS:  [place] at Dr. Delilah Shan - negative per pt  2022: AP, lateral views of right knee reviewed.  Moderate degenerative changes  with joint space narrowing and subchondral sclerosis noted with marginal  osteophytes in the medial compartment primarily with some findings in the  lateral compartment as  well.  No significant patellofemoral arthritis  noted.  No fracture or dislocation.   PATIENT SURVEYS:  LEFS 19 / 80 = 23.8 %  COGNITION: Overall cognitive status: Within functional limits for tasks assessed     SENSATION: WFL  MUSCLE LENGTH: HS: mild tightness Quads: mild tightness ITB: tight R Piriformis: mild R Hip Flexors: mild R Heelcords: mild R   POSTURE: No Significant postural limitations  PALPATION: Palpation: Marked TTP at R gastroc, HS, quads, ITB, R gluteals. Increased tone in post knee. Patellar Mobility: WNL bil  LOWER EXTREMITY ROM:  A/P ROM  Right eval Left eval Right  AROM 07/03/2022 Right  AROM 2/52024  Hip flexion [facility] WFL    Knee flexion 103/116* 125 105 106  Knee extension -5/-2 full -5 -2   (Blank rows = not tested) * pain  LOWER EXTREMITY MMT:  MMT Right eval Left eval  Hip flexion 4   Hip extension 5   Hip abduction 5   Hip adduction    Hip internal rotation    Hip external rotation    Knee flexion 5 mild pain   Knee extension 5   Ankle dorsiflexion 5   Ankle plantarflexion    Ankle inversion    Ankle eversion     (Blank rows = not tested)   FUNCTIONAL TESTS:  Difficulty with sit to stand transfer due to knee pain  GAIT: Distance walked:  40 Assistive device utilized: Single point cane Level of assistance: Modified independence Comments: antalgic, uses cane in R UE; cane adjusted to correct height  06/05/22 Low back assessed:  Full functional ROM, pain with UPA and CPA mobs to bil L3/4, L4/5, L5/S1 R >L - Pain decreases with repeated mobs. Marked tightness in bil gluteals, piriformis and lumbar paraspinals  R>L. Patient limited with prone press ups due to arm weakness.   07/24/2022 SHOULDER ASSESSMENT Posture: forward head, rounded shoulders  CERVICAL ROM:  AROM 07/24/2022  Cervical Flexion 30  Cervical Extension 10  Cervical Rotation to Left 45  Cervical Rotation to Right 45   UPPER EXTREMITY MMT:  MMT Right 07/24/2022 Left 07/24/2022  Shoulder flexion 4 5  Shoulder extension    Shoulder abduction 4 5  Shoulder internal rotation 5 5  Shoulder external rotation 5 5  Elbow flexion 5 5  Elbow extension 5 5  Wrist flexion 5 5  Wrist extension 5 5  Grip strength good good   (Blank rows = not tested)  UPPER EXTREMITY ROM:  Active ROM Right eval Left eval  Shoulder flexion 90 (P 150)  130  Shoulder extension 40 62  Shoulder abduction 90 145  Shoulder internal rotation Functional low back ; 50  Functional to upper back T6; 80  Shoulder external rotation 70 85   (Blank rows = not  tested) Right hand dominant  Palpation: tenderness/tightness R upper trap, R levator scapulae, R cervical paraspinals, R supraspinatus  DGI: 19/24 on 07/30/22 Gait x 600 feet  - gait speed of 2.7 ft/sec on 07/30/22 Stairs - reciprocal gait ascending with required one UE support; descent step to gait and one UE due to pain in R knee.  TODAY'S TREATMENT:  DATE:   08/14/22 Therapeutic Exercise: to improve strength and mobility.  Demo, verbal and tactile cues throughout for technique. UBE L1 x 6 min ([id number]) Review of exercises for postural strengthening : scap squeezes x 10, chin tucks x 10, levator stretch 2 x 15 sec hold bil, UT stretch 2 x 15 sec hold bil Supine exercises for core/LE strengthening: PPT x 10 Supine LTR x 10 Piriformis stretches x 30 sec hold bil  Supine marches - aggraved pain on R side Sciatic nerve/HS stretch x 10 bil  Manual Therapy: to decrease muscle spasm and pain and improve mobility IASTM with foam roller to R glutes and hamstrings, STM/TPR to R lateral hamstrings.     08/09/22 THERAPEUTIC EXERCISE: to improve flexibility, strength and mobility.  Verbal and tactile cues throughout for technique. UBE L1 x 6 min forward  Chin Tucks seated x10 5" hold  Seated Rows RTB x10  Seated ER with RTB x10  Seated Shoulder Horizontal Abduction x10 RTB  Seated Diagonals RTB 2x5 bilat   Serratus Wall Slide with towel x10   MANUAL THERAPY: To promote normalized muscle tension, improved flexibility, improved joint mobility, increased ROM, and reduced pain. Skilled palpation and monitoring of soft tissue during DN Trigger Point Dry-Needling  Treatment instructions: Expect mild to moderate muscle soreness. S/S of pneumothorax if dry needled over a lung field, and to seek immediate medical attention should they occur. Patient verbalized understanding of these  instructions and education. Patient Consent Given: Yes Education handout provided: Previously provided Muscles treated: R lateral deltoid, anterior deltoid, Teres Major and Minor  Electrical stimulation performed: No Parameters: N/A Treatment response/outcome: Twitch Response Elicited and Palpable Increase in Muscle Length STM/DTM, manual TPR and pin & stretch to muscles addressed with DN   08/06/22  Therapeutic Exercise: to improve strength and mobility.  Demo, verbal and tactile cues throughout for technique. UBE forward x 6 min level 1 Review of HEP - chin tucks, scapular retraction, rows, bil ER (without band today), UT and LS stretches.  Manual Therapy: to decrease muscle spasm and pain and improve mobility STM/TPR to cervical paraspinals, bil UT and levator scapulae, IASTM with foam roller to R glutes/piriformis and proximal hamstrings, skilled palpation and monitoring during dry needling. Trigger Point Dry-Needling  Treatment instructions: Expect mild to moderate muscle soreness. S/S of pneumothorax if dry needled over a lung field, and to seek immediate medical attention should they occur. Patient verbalized understanding of these instructions and education. Patient Consent Given: Yes Education handout provided: Previously provided Muscles treated: R UT, R L/S, bil C5-6 multifidi, R glut med, R piriformis Electrical stimulation performed: No Parameters: N/A Treatment response/outcome: Twitch Response Elicited and Palpable Increase in Muscle Length   PATIENT EDUCATION:  Education details: HEP update  Patient Person educated: Patient Education method: Explanation, Demonstration, Tactile cues, Verbal cues, and Handouts Education comprehension: verbalized understanding and returned demonstration  HOME EXERCISE PROGRAM: Access Code: [email or web address] URL: [email or web address] Date: 08/14/2022 Prepared by: EGlenetta Hew Exercises Added   - Lower Trunk Rotations  -  1 x daily - 7 x weekly - 1 sets - 10 reps - Supine Posterior Pelvic Tilt  - 1 x daily - 7 x weekly - 1 sets - 10 reps - Supine Piriformis Stretch with Foot on Ground  - 1 x daily - 7 x weekly - 1 sets - 3 reps - 30 sec  hold - Supine Sciatic Nerve Glide  - 1 x daily - 7 x weekly - 1 sets -  10 reps   ASSESSMENT:  CLINICAL IMPRESSION:  Pt reports shoulder pain has improved significantly, although ER with band is aggravating, ok to discontinue and do just scap squeezes without band.  Still reports hamstring pain in R knee and thinks she has sciatica.  Today given neutral spine exercises for core strengthening which she tolerated well except for supine marches which increased pain in R hamstring.  Noted still very tender in R hamstring, also noted fullness behind R knee, said Dr. Raeford Razor had checked for Baker's cyst there but it was negative.  Evelena E Hakim continues to demonstrate potential for improvement and would benefit from continued skilled therapy to address impairments.      OBJECTIVE IMPAIRMENTS: Abnormal gait, decreased balance, decreased ROM, decreased strength, increased fascial restrictions, increased muscle spasms, impaired flexibility, obesity, and pain.   ACTIVITY LIMITATIONS: standing, transfers, and locomotion level  PARTICIPATION LIMITATIONS: meal prep and community activity  PERSONAL FACTORS: Time since onset of injury/illness/exacerbation and 1 comorbidity: LBP  are also affecting patient's functional outcome.   REHAB POTENTIAL: Excellent  CLINICAL DECISION MAKING: Stable/uncomplicated  EVALUATION COMPLEXITY: Low   GOALS: Goals reviewed with patient? Yes  SHORT TERM GOALS: Target date: 06/06/2022   Patient will be independent with initial HEP. Baseline:  Goal status: MET  2.  Balance assesment completed Baseline:  Goal status: MET  LONG TERM GOALS: Target date: 07/04/2022 extended to 08/28/2022.   Patient will be independent with advanced/ongoing HEP to  improve outcomes and carryover.  Baseline:  Goal status: IN PROGRESS 07/30/22- not fully compliant due to knee pain  2.  Patient will report at least 75% improvement in R knee pain to improve QOL. Baseline:  Goal status: IN PROGRESS- 07/30/22 - reports 2% improvement  3.  Patient will demonstrate improved R knee AROM to >/= 2--120 deg to allow for normal gait and stair mechanics. Baseline:  Goal status: IN PROGRESS 07/30/22 -see objective.   4.  Patient will demonstrate improved functional LE strength as demonstrated by being able to complete 5xSTS in <=14.8 sec. Baseline:  Goal status: MET 07/03/2022- 12.4 seconds, with increased knee pain.   5.  Patient will be able to ambulate 600' with LRAD and normal gait pattern without increased pain to access community.  Baseline:  Goal status: IN PROGRESS 07/03/22- noted increased gait deviation after 450', 07/30/22 - able to amb 600 ft with mild gait deviations deviating from straight path and decreased heel strike bil as well as decreased step length L.  6. Patient will be stand to cook meals without increased R knee pain. Baseline:  Goal status: IN PROGRESS 07/30/22 limited to 10-15 min  7.  Patient will report 28/80 on LEFS  to demonstrate improved functional ability. Baseline: 19 / 80 = 23.8 % at eval, 29 / 80 = 36.3 % on 07/30/22 Goal status: MET  8.  Patient will demonstrate at least 19/24 on DGI to decrease risk of falls. Baseline: 17/24, 19/24 on 07/30/22 Goal status: MET   9.  Patient will be able to sleep without waking from knee pain Baseline:  Goal status: IN PROGRESS  10.  Patient will report 50% improvement in Right shoulder pain to improve QOL.  Baseline:  Goal status: MET 08/14/22- met, no shoulder pain today   11.  Patient will report 10% improvement on [employer].  Baseline: 38.6 / 100 = 38.6 %  Goal status: IN PROGRESS   PLAN:  PT FREQUENCY: 2x/week  PT DURATION: 6 weeks  PLANNED INTERVENTIONS: Therapeutic  exercises,  Therapeutic activity, Neuromuscular re-education, Balance training, Gait training, Patient/Family education, Self Care, Joint mobilization, Aquatic Therapy, Dry Needling, Electrical stimulation, Spinal mobilization, Cryotherapy, Moist heat, Taping, Ultrasound, Ionotophoresis 26m/ml Dexamethasone, and Manual therapy  PLAN FOR NEXT SESSION: Update/Review HEP as appropriate, continue postural strengthening, R shoulder strengthening, manual therapy, TrDN.   Try [place] R hamstring ?  ERennie Natter PT, DPT  08/14/2022, 3:21 PM

## 2022-08-22 ENCOUNTER — Ambulatory Visit: Payer: 59

## 2022-08-22 ENCOUNTER — Encounter: Payer: Self-pay | Admitting: Family Medicine

## 2022-08-22 ENCOUNTER — Ambulatory Visit (INDEPENDENT_AMBULATORY_CARE_PROVIDER_SITE_OTHER): Payer: 59 | Admitting: Family Medicine

## 2022-08-22 VITALS — BP 138/78 | Ht 61.0 in | Wt 175.0 lb

## 2022-08-22 DIAGNOSIS — M5459 Other low back pain: Secondary | ICD-10-CM

## 2022-08-22 DIAGNOSIS — M5416 Radiculopathy, lumbar region: Secondary | ICD-10-CM

## 2022-08-22 DIAGNOSIS — M25611 Stiffness of right shoulder, not elsewhere classified: Secondary | ICD-10-CM | POA: Diagnosis not present

## 2022-08-22 DIAGNOSIS — G8929 Other chronic pain: Secondary | ICD-10-CM | POA: Diagnosis not present

## 2022-08-22 DIAGNOSIS — R2681 Unsteadiness on feet: Secondary | ICD-10-CM | POA: Diagnosis not present

## 2022-08-22 DIAGNOSIS — R252 Cramp and spasm: Secondary | ICD-10-CM

## 2022-08-22 DIAGNOSIS — M1711 Unilateral primary osteoarthritis, right knee: Secondary | ICD-10-CM

## 2022-08-22 DIAGNOSIS — M25661 Stiffness of right knee, not elsewhere classified: Secondary | ICD-10-CM

## 2022-08-22 DIAGNOSIS — M25511 Pain in right shoulder: Secondary | ICD-10-CM | POA: Diagnosis not present

## 2022-08-22 DIAGNOSIS — M25561 Pain in right knee: Secondary | ICD-10-CM | POA: Diagnosis not present

## 2022-08-22 NOTE — Assessment & Plan Note (Signed)
Acute on chronic in nature.  Degenerative changes appreciated within the knee.  She appreciates more pain in the leg pain is localized to the knee.  She received different injections for the knee with limited improvement. -Consider home exercise therapy and supportive care. -Could consider gel injection or Zilretta or nerve block.

## 2022-08-22 NOTE — Patient Instructions (Signed)
Good to see you Please use ice on the knee  and heat on the back  Please continue physical therapy  We could consider gel injection or zilretta injection for the knee  We would send the epidural order to [facility] imaging   Please send me a message in MyChart with any questions or updates.  Please see me back in 4 weeks.   --Dr. Raeford Razor

## 2022-08-22 NOTE — Progress Notes (Signed)
  Marie Hebert - 89 y.o. female MRN [id number]  Date of birth: 1933/06/06  SUBJECTIVE:  Including CC & ROS.  No chief complaint on file.   Marie Hebert is a 89 y.o. female that is following up for her right leg pain and right knee pain.  She reports receiving 2 injections some around her back.  Review of her MRI of the lumbar spine is showing a L5-S1 nerve impingement of the L5 nerve root.  Review of the MRI of the right knee reveals osteoarthritis and effusion.    Review of Systems See HPI   HISTORY: Past Medical, Surgical, Social, and Family History Reviewed & Updated per EMR.   Pertinent Historical Findings include:  Past Medical History:  Diagnosis Date   Back pain    Dizziness    Hypertension    Palpitations     Past Surgical History:  Procedure Laterality Date   CATARACT EXTRACTION  2010   right   COLONOSCOPY  2003   LAPAROSCOPIC APPENDECTOMY N/A 09/25/2020   Procedure: APPENDECTOMY LAPAROSCOPIC;  Surgeon: Mickeal Skinner, MD;  Location: [place];  Service: General;  Laterality: N/A;   RETINAL LASER PROCEDURE       PHYSICAL EXAM:  VS: BP 138/78   Ht 5' 1"$  (1.549 m)   Wt 175 lb (79.4 kg)   BMI 33.07 kg/m  Physical Exam Gen: NAD, alert, cooperative with exam, well-appearing MSK:  Neurovascularly intact       ASSESSMENT & PLAN:   DJD (degenerative joint disease) Acute on chronic in nature.  Degenerative changes appreciated within the knee.  She appreciates more pain in the leg pain is localized to the knee.  She received different injections for the knee with limited improvement. -Consider home exercise therapy and supportive care. -Could consider gel injection or Zilretta or nerve block.  Lumbar radiculopathy Still acutely having pain.  Has done well with physical therapy.  Has not a L5 nerve root impingement on the recent MRI. -Counseled on home exercise therapy and supportive care. -Would pursue epidural.

## 2022-08-22 NOTE — Assessment & Plan Note (Signed)
Still acutely having pain.  Has done well with physical therapy.  Has not a L5 nerve root impingement on the recent MRI. -Counseled on home exercise therapy and supportive care. -Would pursue epidural.

## 2022-08-22 NOTE — Therapy (Signed)
OUTPATIENT PHYSICAL THERAPY TREATMENT       Patient Name: Marie Hebert MRN: [id number] DOB:[birth year], 89 y.o., female Today's Date: 08/22/2022  END OF SESSION:  PT End of Session - 08/22/22 1412     Visit Number 15    Date for PT Re-Evaluation 08/28/22    Authorization Type [employer] MCR    Progress Note Due on Visit 20    PT Start Time [id number]    PT Stop Time 1436    PT Time Calculation (min) 39 min    Activity Tolerance Patient tolerated treatment well    Behavior During Therapy WFL for tasks assessed/performed                    Past Medical History:  Diagnosis Date   Back pain    Dizziness    Hypertension    Palpitations    Past Surgical History:  Procedure Laterality Date   CATARACT EXTRACTION  2010   right   COLONOSCOPY  2003   LAPAROSCOPIC APPENDECTOMY N/A 09/25/2020   Procedure: APPENDECTOMY LAPAROSCOPIC;  Surgeon: Mickeal Skinner, MD;  Location: [facility];  Service: General;  Laterality: N/A;   RETINAL LASER PROCEDURE     Patient Active Problem List   Diagnosis Date Noted   Lumbar radiculopathy 07/25/2022   Soft tissue mass 07/25/2022   Osteopenia of neck of right femur 07/25/2022   Early dry stage nonexudative age-related macular degeneration of both eyes 01/10/2022   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 01/10/2022   Status post surgery 09/25/2020   Essential hypertension 07/12/2017   PAC (premature atrial contraction) 07/12/2017   Mitral regurgitation 07/12/2017   Onychomycosis 08/10/2015   PCP NOTES>>> 04/06/2015   Skin lesion 03/18/2013   Annual physical exam 12/31/2011   DJD (degenerative joint disease) 12/31/2011   Vitamin D deficiency 12/31/2011   Recurrent UTI 10/10/2011   PALPITATIONS, OCCASIONAL 10/14/2007   BACK PAIN 09/16/2007    PCP: Colon Branch, MD   REFERRING PROVIDER: Colon Branch, MD   REFERRING DIAG: [phone] (ICD-10-CM) - Right knee pain, unspecified chronicity  THERAPY DIAG:  Chronic pain of  right knee  Stiffness of right knee, not elsewhere classified  Cramp and spasm  Unsteadiness on feet  Other low back pain  Acute pain of right shoulder  Stiffness of right shoulder, not elsewhere classified  Rationale for Evaluation and Treatment: Rehabilitation  ONSET DATE: March/April 2023  SUBJECTIVE:   SUBJECTIVE STATEMENT:  Pt reports that she still has trouble sleeping at night d/t pain. Went to MD earlier, noting L5 nerve compression and OA in R knee.   PERTINENT HISTORY:  LBP  PAIN:  Are you having pain? No  PRECAUTIONS: None  WEIGHT BEARING RESTRICTIONS: No  FALLS:  Has patient fallen in last 6 months? No  LIVING ENVIRONMENT: Lives with: lives alone Lives in: House/apartment Stairs: No Has following equipment at home: Single point cane  OCCUPATION: retired  PLOF: Independent  PATIENT GOALS: Get rid of her knee pain and know what is wrong.  NEXT MD VISIT: 10/29/22   OBJECTIVE:   DIAGNOSTIC FINDINGS:  [place] at Dr. Delilah Shan - negative per pt  2022: AP, lateral views of right knee reviewed.  Moderate degenerative changes  with joint space narrowing and subchondral sclerosis noted with marginal  osteophytes in the medial compartment primarily with some findings in the  lateral compartment as well.  No significant patellofemoral arthritis  noted.  No fracture or dislocation.  PATIENT SURVEYS:  LEFS 19 / 80 = 23.8 %  COGNITION: Overall cognitive status: Within functional limits for tasks assessed     SENSATION: WFL  MUSCLE LENGTH: HS: mild tightness Quads: mild tightness ITB: tight R Piriformis: mild R Hip Flexors: mild R Heelcords: mild R   POSTURE: No Significant postural limitations  PALPATION: Palpation: Marked TTP at R gastroc, HS, quads, ITB, R gluteals. Increased tone in post knee. Patellar Mobility: WNL bil  LOWER EXTREMITY ROM:  A/P ROM Right eval Left eval Right  AROM 07/03/2022 Right  AROM 2/52024  Hip flexion [facility] WFL     Knee flexion 103/116* 125 105 106  Knee extension -5/-2 full -5 -2   (Blank rows = not tested) * pain  LOWER EXTREMITY MMT:  MMT Right eval Left eval  Hip flexion 4   Hip extension 5   Hip abduction 5   Hip adduction    Hip internal rotation    Hip external rotation    Knee flexion 5 mild pain   Knee extension 5   Ankle dorsiflexion 5   Ankle plantarflexion    Ankle inversion    Ankle eversion     (Blank rows = not tested)   FUNCTIONAL TESTS:  Difficulty with sit to stand transfer due to knee pain  GAIT: Distance walked:  40 Assistive device utilized: Single point cane Level of assistance: Modified independence Comments: antalgic, uses cane in R UE; cane adjusted to correct height  06/05/22 Low back assessed:  Full functional ROM, pain with UPA and CPA mobs to bil L3/4, L4/5, L5/S1 R >L - Pain decreases with repeated mobs. Marked tightness in bil gluteals, piriformis and lumbar paraspinals  R>L. Patient limited with prone press ups due to arm weakness.   07/24/2022 SHOULDER ASSESSMENT Posture: forward head, rounded shoulders  CERVICAL ROM:  AROM 07/24/2022  Cervical Flexion 30  Cervical Extension 10  Cervical Rotation to Left 45  Cervical Rotation to Right 45   UPPER EXTREMITY MMT:  MMT Right 07/24/2022 Left 07/24/2022  Shoulder flexion 4 5  Shoulder extension    Shoulder abduction 4 5  Shoulder internal rotation 5 5  Shoulder external rotation 5 5  Elbow flexion 5 5  Elbow extension 5 5  Wrist flexion 5 5  Wrist extension 5 5  Grip strength good good   (Blank rows = not tested)  UPPER EXTREMITY ROM:  Active ROM Right eval Left eval  Shoulder flexion 90 (P 150)  130  Shoulder extension 40 62  Shoulder abduction 90 145  Shoulder internal rotation Functional low back ; 50  Functional to upper back T6; 80  Shoulder external rotation 70 85   (Blank rows = not tested) Right hand dominant  Palpation: tenderness/tightness R upper trap, R levator  scapulae, R cervical paraspinals, R supraspinatus  DGI: 19/24 on 07/30/22 Gait x 600 feet  - gait speed of 2.7 ft/sec on 07/30/22 Stairs - reciprocal gait ascending with required one UE support; descent step to gait and one UE due to pain in R knee.  TODAY'S TREATMENT:  DATE:  08/22/22  Therapeutic Exercise: to improve strength and mobility.  Demo, verbal and tactile cues throughout for technique. UBE L1 x 6 min Supine pelvic tilts x 10 Supine LTR x 10 both ways Piriformis stretch x 30 KTOS  Sciatic nerve glide x 10 - L side limited by hip flexor pain Seated horiz ABD RTB x 10 Seated B ER RTB x 10 Wall slide flexion x 10   08/14/22 Therapeutic Exercise: to improve strength and mobility.  Demo, verbal and tactile cues throughout for technique. UBE L1 x 6 min ([id number]) Review of exercises for postural strengthening : scap squeezes x 10, chin tucks x 10, levator stretch 2 x 15 sec hold bil, UT stretch 2 x 15 sec hold bil Supine exercises for core/LE strengthening: PPT x 10 Supine LTR x 10 Piriformis stretches x 30 sec hold bil  Supine marches - aggraved pain on R side Sciatic nerve/HS stretch x 10 bil  Manual Therapy: to decrease muscle spasm and pain and improve mobility IASTM with foam roller to R glutes and hamstrings, STM/TPR to R lateral hamstrings.     08/09/22 THERAPEUTIC EXERCISE: to improve flexibility, strength and mobility.  Verbal and tactile cues throughout for technique. UBE L1 x 6 min forward  Chin Tucks seated x10 5" hold  Seated Rows RTB x10  Seated ER with RTB x10  Seated Shoulder Horizontal Abduction x10 RTB  Seated Diagonals RTB 2x5 bilat   Serratus Wall Slide with towel x10   MANUAL THERAPY: To promote normalized muscle tension, improved flexibility, improved joint mobility, increased ROM, and reduced pain. Skilled palpation and monitoring of  soft tissue during DN Trigger Point Dry-Needling  Treatment instructions: Expect mild to moderate muscle soreness. S/S of pneumothorax if dry needled over a lung field, and to seek immediate medical attention should they occur. Patient verbalized understanding of these instructions and education. Patient Consent Given: Yes Education handout provided: Previously provided Muscles treated: R lateral deltoid, anterior deltoid, Teres Major and Minor  Electrical stimulation performed: No Parameters: N/A Treatment response/outcome: Twitch Response Elicited and Palpable Increase in Muscle Length STM/DTM, manual TPR and pin & stretch to muscles addressed with DN   08/06/22  Therapeutic Exercise: to improve strength and mobility.  Demo, verbal and tactile cues throughout for technique. UBE forward x 6 min level 1 Review of HEP - chin tucks, scapular retraction, rows, bil ER (without band today), UT and LS stretches.  Manual Therapy: to decrease muscle spasm and pain and improve mobility STM/TPR to cervical paraspinals, bil UT and levator scapulae, IASTM with foam roller to R glutes/piriformis and proximal hamstrings, skilled palpation and monitoring during dry needling. Trigger Point Dry-Needling  Treatment instructions: Expect mild to moderate muscle soreness. S/S of pneumothorax if dry needled over a lung field, and to seek immediate medical attention should they occur. Patient verbalized understanding of these instructions and education. Patient Consent Given: Yes Education handout provided: Previously provided Muscles treated: R UT, R L/S, bil C5-6 multifidi, R glut med, R piriformis Electrical stimulation performed: No Parameters: N/A Treatment response/outcome: Twitch Response Elicited and Palpable Increase in Muscle Length   PATIENT EDUCATION:  Education details: HEP update  Patient Person educated: Patient Education method: Explanation, Demonstration, Tactile cues, Verbal cues, and  Handouts Education comprehension: verbalized understanding and returned demonstration  HOME EXERCISE PROGRAM: Access Code: [email or web address] URL: [email or web address] Date: 08/14/2022 Prepared by: EGlenetta Hew Exercises Added   - Lower Trunk Rotations  - 1 x daily - 7 x weekly -  1 sets - 10 reps - Supine Posterior Pelvic Tilt  - 1 x daily - 7 x weekly - 1 sets - 10 reps - Supine Piriformis Stretch with Foot on Ground  - 1 x daily - 7 x weekly - 1 sets - 3 reps - 30 sec  hold - Supine Sciatic Nerve Glide  - 1 x daily - 7 x weekly - 1 sets - 10 reps   ASSESSMENT:  CLINICAL IMPRESSION:  Continued working on light lumbopelvic mobility and shoulder strengthening. Patient showed fatigue after 1 set of horiz ABD with TB. Cues needed to keep scapula depressed with TB exercises. Pt also noted R shoulder fatigue with wall slides. She was able to complete all interventions today with no increased pain. Would continue to benefit from skilled therapy to address deficits.    OBJECTIVE IMPAIRMENTS: Abnormal gait, decreased balance, decreased ROM, decreased strength, increased fascial restrictions, increased muscle spasms, impaired flexibility, obesity, and pain.   ACTIVITY LIMITATIONS: standing, transfers, and locomotion level  PARTICIPATION LIMITATIONS: meal prep and community activity  PERSONAL FACTORS: Time since onset of injury/illness/exacerbation and 1 comorbidity: LBP  are also affecting patient's functional outcome.   REHAB POTENTIAL: Excellent  CLINICAL DECISION MAKING: Stable/uncomplicated  EVALUATION COMPLEXITY: Low   GOALS: Goals reviewed with patient? Yes  SHORT TERM GOALS: Target date: 06/06/2022   Patient will be independent with initial HEP. Baseline:  Goal status: MET  2.  Balance assesment completed Baseline:  Goal status: MET  LONG TERM GOALS: Target date: 07/04/2022 extended to 08/28/2022.   Patient will be independent with advanced/ongoing HEP to  improve outcomes and carryover.  Baseline:  Goal status: IN PROGRESS 07/30/22- not fully compliant due to knee pain  2.  Patient will report at least 75% improvement in R knee pain to improve QOL. Baseline:  Goal status: IN PROGRESS- 07/30/22 - reports 2% improvement  3.  Patient will demonstrate improved R knee AROM to >/= 2--120 deg to allow for normal gait and stair mechanics. Baseline:  Goal status: IN PROGRESS 07/30/22 -see objective.   4.  Patient will demonstrate improved functional LE strength as demonstrated by being able to complete 5xSTS in <=14.8 sec. Baseline:  Goal status: MET 07/03/2022- 12.4 seconds, with increased knee pain.   5.  Patient will be able to ambulate 600' with LRAD and normal gait pattern without increased pain to access community.  Baseline:  Goal status: IN PROGRESS 07/03/22- noted increased gait deviation after 450', 07/30/22 - able to amb 600 ft with mild gait deviations deviating from straight path and decreased heel strike bil as well as decreased step length L.  6. Patient will be stand to cook meals without increased R knee pain. Baseline:  Goal status: IN PROGRESS 07/30/22 limited to 10-15 min  7.  Patient will report 28/80 on LEFS  to demonstrate improved functional ability. Baseline: 19 / 80 = 23.8 % at eval, 29 / 80 = 36.3 % on 07/30/22 Goal status: MET  8.  Patient will demonstrate at least 19/24 on DGI to decrease risk of falls. Baseline: 17/24, 19/24 on 07/30/22 Goal status: MET   9.  Patient will be able to sleep without waking from knee pain Baseline:  Goal status: IN PROGRESS  10.  Patient will report 50% improvement in Right shoulder pain to improve QOL.  Baseline:  Goal status: MET 08/14/22- met, no shoulder pain today   11.  Patient will report 10% improvement on [employer].  Baseline: 38.6 / 100 =  38.6 %  Goal status: IN PROGRESS   PLAN:  PT FREQUENCY: 2x/week  PT DURATION: 6 weeks  PLANNED INTERVENTIONS: Therapeutic exercises,  Therapeutic activity, Neuromuscular re-education, Balance training, Gait training, Patient/Family education, Self Care, Joint mobilization, Aquatic Therapy, Dry Needling, Electrical stimulation, Spinal mobilization, Cryotherapy, Moist heat, Taping, Ultrasound, Ionotophoresis '4mg'$ /ml Dexamethasone, and Manual therapy  PLAN FOR NEXT SESSION: Update/Review HEP as appropriate, continue postural strengthening, R shoulder strengthening, manual therapy, TrDN.   Try [place] to R hamstring ?  Artist Pais, PTA 08/22/2022, 2:39 PM

## 2022-08-28 ENCOUNTER — Ambulatory Visit: Payer: 59 | Attending: Internal Medicine | Admitting: Physical Therapy

## 2022-08-28 ENCOUNTER — Encounter: Payer: Self-pay | Admitting: Physical Therapy

## 2022-08-28 ENCOUNTER — Ambulatory Visit: Payer: 59 | Admitting: Physical Therapy

## 2022-08-28 DIAGNOSIS — G8929 Other chronic pain: Secondary | ICD-10-CM | POA: Insufficient documentation

## 2022-08-28 DIAGNOSIS — M25561 Pain in right knee: Secondary | ICD-10-CM | POA: Insufficient documentation

## 2022-08-28 DIAGNOSIS — R2681 Unsteadiness on feet: Secondary | ICD-10-CM | POA: Diagnosis not present

## 2022-08-28 DIAGNOSIS — M5459 Other low back pain: Secondary | ICD-10-CM | POA: Diagnosis not present

## 2022-08-28 DIAGNOSIS — R252 Cramp and spasm: Secondary | ICD-10-CM | POA: Insufficient documentation

## 2022-08-28 DIAGNOSIS — M25661 Stiffness of right knee, not elsewhere classified: Secondary | ICD-10-CM | POA: Diagnosis not present

## 2022-08-28 NOTE — Therapy (Signed)
OUTPATIENT PHYSICAL THERAPY TREATMENT   Progress Note Reporting Period 07/03/2022 to 08/28/2022  See note below for Objective Data and Assessment of Progress/Goals.     Patient Name: NIELA RAIBLE MRN: [id number] DOB:25-Nov-1932, 89 y.o., female Today's Date: 08/28/2022  END OF SESSION:  PT End of Session - 08/28/22 1408     Visit Number 16    Number of Visits 22    Date for PT Re-Evaluation 10/09/22    Authorization Type [employer] MCR    Progress Note Due on Visit 20    PT Start Time 1408    PT Stop Time 1445    PT Time Calculation (min) 37 min    Activity Tolerance Patient tolerated treatment well    Behavior During Therapy WFL for tasks assessed/performed                    Past Medical History:  Diagnosis Date   Back pain    Dizziness    Hypertension    Palpitations    Past Surgical History:  Procedure Laterality Date   CATARACT EXTRACTION  2010   right   COLONOSCOPY  2003   LAPAROSCOPIC APPENDECTOMY N/A 09/25/2020   Procedure: APPENDECTOMY LAPAROSCOPIC;  Surgeon: Mickeal Skinner, MD;  Location: [facility];  Service: General;  Laterality: N/A;   RETINAL LASER PROCEDURE     Patient Active Problem List   Diagnosis Date Noted   Lumbar radiculopathy 07/25/2022   Soft tissue mass 07/25/2022   Osteopenia of neck of right femur 07/25/2022   Early dry stage nonexudative age-related macular degeneration of both eyes 01/10/2022   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 01/10/2022   Status post surgery 09/25/2020   Essential hypertension 07/12/2017   PAC (premature atrial contraction) 07/12/2017   Mitral regurgitation 07/12/2017   Onychomycosis 08/10/2015   PCP NOTES>>> 04/06/2015   Skin lesion 03/18/2013   Annual physical exam 12/31/2011   DJD (degenerative joint disease) 12/31/2011   Vitamin D deficiency 12/31/2011   Recurrent UTI 10/10/2011   PALPITATIONS, OCCASIONAL 10/14/2007   BACK PAIN 09/16/2007    PCP: Colon Branch, MD    REFERRING PROVIDER: Colon Branch, MD   REFERRING DIAG: [phone] (ICD-10-CM) - Right knee pain, unspecified chronicity  THERAPY DIAG:  Chronic pain of right knee  Stiffness of right knee, not elsewhere classified  Cramp and spasm  Unsteadiness on feet  Other low back pain  Rationale for Evaluation and Treatment: Rehabilitation  ONSET DATE: March/April 2023  SUBJECTIVE:   SUBJECTIVE STATEMENT:  She reports the pain behind her knee is still exactly the same, not improving.  Her MD recommended epidural injection for her back, but she is concerned because a friend was injured when she had an epidural during delivery.   Her shoulder is much better.   PERTINENT HISTORY:  LBP  PAIN:  Are you having pain? Yes: NPRS scale: 0/10 Pain location: L UT  Pain description: tightness    PRECAUTIONS: None  WEIGHT BEARING RESTRICTIONS: No  FALLS:  Has patient fallen in last 6 months? No  LIVING ENVIRONMENT: Lives with: lives alone Lives in: House/apartment Stairs: No Has following equipment at home: Single point cane  OCCUPATION: retired  PLOF: Independent  PATIENT GOALS: Get rid of her knee pain and know what is wrong.  NEXT MD VISIT: 10/29/22   OBJECTIVE:   DIAGNOSTIC FINDINGS:  [place] at Dr. Delilah Shan - negative per pt  2022: AP, lateral views of right knee reviewed.  Moderate degenerative changes  with joint space narrowing and subchondral sclerosis noted with marginal  osteophytes in the medial compartment primarily with some findings in the  lateral compartment as well.  No significant patellofemoral arthritis  noted.  No fracture or dislocation.   PATIENT SURVEYS:  LEFS 19 / 80 = 23.8 %  COGNITION: Overall cognitive status: Within functional limits for tasks assessed     SENSATION: WFL  MUSCLE LENGTH: HS: mild tightness Quads: mild tightness ITB: tight R Piriformis: mild R Hip Flexors: mild R Heelcords: mild R   POSTURE: No Significant postural  limitations  PALPATION: Palpation: Marked TTP at R gastroc, HS, quads, ITB, R gluteals. Increased tone in post knee. Patellar Mobility: WNL bil  LOWER EXTREMITY ROM:  A/P ROM Right eval Left eval Right  AROM 07/03/2022 Right  AROM 2/52024  Hip flexion [facility] WFL    Knee flexion 103/116* 125 105 106  Knee extension -5/-2 full -5 -2   (Blank rows = not tested) * pain  LOWER EXTREMITY MMT:  MMT Right eval Left eval  Hip flexion 4   Hip extension 5   Hip abduction 5   Hip adduction    Hip internal rotation    Hip external rotation    Knee flexion 5 mild pain   Knee extension 5   Ankle dorsiflexion 5   Ankle plantarflexion    Ankle inversion    Ankle eversion     (Blank rows = not tested)   FUNCTIONAL TESTS:  Difficulty with sit to stand transfer due to knee pain  GAIT: Distance walked:  40 Assistive device utilized: Single point cane Level of assistance: Modified independence Comments: antalgic, uses cane in R UE; cane adjusted to correct height  06/05/22 Low back assessed:  Full functional ROM, pain with UPA and CPA mobs to bil L3/4, L4/5, L5/S1 R >L - Pain decreases with repeated mobs. Marked tightness in bil gluteals, piriformis and lumbar paraspinals  R>L. Patient limited with prone press ups due to arm weakness.   07/24/2022 SHOULDER ASSESSMENT Posture: forward head, rounded shoulders  CERVICAL ROM:  AROM 07/24/2022 08/28/2022  Cervical Flexion 30 35  Cervical Extension 10 35  Cervical Rotation to Left 45 45  Cervical Rotation to Right 45 45   UPPER EXTREMITY MMT:  MMT Right 07/24/2022 Left 07/24/2022  Shoulder flexion 4 5  Shoulder extension    Shoulder abduction 4 5  Shoulder internal rotation 5 5  Shoulder external rotation 5 5  Elbow flexion 5 5  Elbow extension 5 5  Wrist flexion 5 5  Wrist extension 5 5  Grip strength good good   (Blank rows = not tested)  UPPER EXTREMITY ROM:  Active ROM Right eval Left eval  Shoulder flexion 90 (P 150)   130  Shoulder extension 40 62  Shoulder abduction 90 145  Shoulder internal rotation Functional low back ; 50  Functional to upper back T6; 80  Shoulder external rotation 70 85   (Blank rows = not tested) Right hand dominant  Palpation: tenderness/tightness R upper trap, R levator scapulae, R cervical paraspinals, R supraspinatus  DGI: 19/24 on 07/30/22 Gait x 600 feet  - gait speed of 2.7 ft/sec on 07/30/22 Stairs - reciprocal gait ascending with required one UE support; descent step to gait and one UE due to pain in R knee.  TODAY'S TREATMENT:  DATE:   08/28/2022 Therapeutic Activity:  progress towards goals.  QuickDash, cervical ROM.  Self Care: Education on anatomy, referral patterns, different kinds of "epidurals"- and what the epidural injections are vs. Epidurals for childbirth, review of HEP, plan of care.  Manual Therapy: to decrease muscle spasm and pain and improve mobility STM/TPR to cervical paraspinals and UT, skilled palpation and monitoring during dry needling. Trigger Point Dry-Needling  Treatment instructions: Expect mild to moderate muscle soreness. S/S of pneumothorax if dry needled over a lung field, and to seek immediate medical attention should they occur. Patient verbalized understanding of these instructions and education. Patient Consent Given: Yes Education handout provided: Previously provided Muscles treated: R UT, R infraspinatus Electrical stimulation performed: No Parameters: N/A Treatment response/outcome: Twitch Response Elicited and Palpable Increase in Muscle Length   08/22/22  Therapeutic Exercise: to improve strength and mobility.  Demo, verbal and tactile cues throughout for technique. UBE L1 x 6 min Supine pelvic tilts x 10 Supine LTR x 10 both ways Piriformis stretch x 30 KTOS  Sciatic nerve glide x 10 - L side limited by  hip flexor pain Seated horiz ABD RTB x 10 Seated B ER RTB x 10 Wall slide flexion x 10   08/14/22 Therapeutic Exercise: to improve strength and mobility.  Demo, verbal and tactile cues throughout for technique. UBE L1 x 6 min ([id number]) Review of exercises for postural strengthening : scap squeezes x 10, chin tucks x 10, levator stretch 2 x 15 sec hold bil, UT stretch 2 x 15 sec hold bil Supine exercises for core/LE strengthening: PPT x 10 Supine LTR x 10 Piriformis stretches x 30 sec hold bil  Supine marches - aggraved pain on R side Sciatic nerve/HS stretch x 10 bil  Manual Therapy: to decrease muscle spasm and pain and improve mobility IASTM with foam roller to R glutes and hamstrings, STM/TPR to R lateral hamstrings.    PATIENT EDUCATION:  Education details: HEP Review Person educated: Patient Education method: Explanation, Demonstration, TCorporate treasurercues, Verbal cues, and Handouts Education comprehension: verbalized understanding and returned demonstration  HOME EXERCISE PROGRAM: Access Code: [email or web address]: [email or web address] Date: 08/14/2022 Prepared by: EGlenetta Hew Exercises Added   - Lower Trunk Rotations  - 1 x daily - 7 x weekly - 1 sets - 10 reps - Supine Posterior Pelvic Tilt  - 1 x daily - 7 x weekly - 1 sets - 10 reps - Supine Piriformis Stretch with Foot on Ground  - 1 x daily - 7 x weekly - 1 sets - 3 reps - 30 sec  hold - Supine Sciatic Nerve Glide  - 1 x daily - 7 x weekly - 1 sets - 10 reps   ASSESSMENT:  CLINICAL IMPRESSION:  Aleksa E Alperin reports that her shoulder pain is improving significant and she is able to move her neck more.  Her QJeanie Cookshas also improved, meeting LTG #[id number].  She still reports significant sciatic pain.  We spend a lot of time today on education, as she was confused by the term epidural, but after education on where the injections are placed and differences in epidurals for childbirth, was much more interested  in getting epidural injection for back.   Will contact her MD for her about them.  She would benefit from continued skilled therapy for 1x/week for additional 6 weeks to consistently focus on back to try to improve sciatic symptoms while waiting on scheduling for ESI.  Sady E Hinderer continues to demonstrate potential  for improvement and would benefit from continued skilled therapy to address impairments.      OBJECTIVE IMPAIRMENTS: Abnormal gait, decreased balance, decreased ROM, decreased strength, increased fascial restrictions, increased muscle spasms, impaired flexibility, obesity, and pain.   ACTIVITY LIMITATIONS: standing, transfers, and locomotion level  PARTICIPATION LIMITATIONS: meal prep and community activity  PERSONAL FACTORS: Time since onset of injury/illness/exacerbation and 1 comorbidity: LBP  are also affecting patient's functional outcome.   REHAB POTENTIAL: Excellent  CLINICAL DECISION MAKING: Stable/uncomplicated  EVALUATION COMPLEXITY: Low   GOALS: Goals reviewed with patient? Yes  SHORT TERM GOALS: Target date: 06/06/2022   Patient will be independent with initial HEP. Baseline:  Goal status: MET  2.  Balance assesment completed Baseline:  Goal status: MET  LONG TERM GOALS: Target date: 07/04/2022 extended to 08/28/2022.   Patient will be independent with advanced/ongoing HEP to improve outcomes and carryover.  Baseline:  Goal status: IN PROGRESS 07/30/22- not fully compliant due to knee pain  2.  Patient will report at least 75% improvement in R knee pain to improve QOL. Baseline:  Goal status: IN PROGRESS- 07/30/22 - reports 2% improvement  3.  Patient will demonstrate improved R knee AROM to >/= 2--120 deg to allow for normal gait and stair mechanics. Baseline:  Goal status: IN PROGRESS 07/30/22 -see objective.   4.  Patient will demonstrate improved functional LE strength as demonstrated by being able to complete 5xSTS in <=14.8 sec. Baseline:   Goal status: MET 07/03/2022- 12.4 seconds, with increased knee pain.   5.  Patient will be able to ambulate 600' with LRAD and normal gait pattern without increased pain to access community.  Baseline:  Goal status: IN PROGRESS 07/03/22- noted increased gait deviation after 450', 07/30/22 - able to amb 600 ft with mild gait deviations deviating from straight path and decreased heel strike bil as well as decreased step length L.  6. Patient will be stand to cook meals without increased R knee pain. Baseline:  Goal status: IN PROGRESS 07/30/22 limited to 10-15 min  7.  Patient will report 28/80 on LEFS  to demonstrate improved functional ability. Baseline: 19 / 80 = 23.8 % at eval, 29 / 80 = 36.3 % on 07/30/22 Goal status: MET  8.  Patient will demonstrate at least 19/24 on DGI to decrease risk of falls. Baseline: 17/24, 19/24 on 07/30/22 Goal status: MET   9.  Patient will be able to sleep without waking from knee pain Baseline:  Goal status: IN PROGRESS 08/28/22- still having difficulty sleeping due to knee pain  10.  Patient will report 50% improvement in Right shoulder pain to improve QOL.  Baseline:  Goal status: MET 08/14/22- met, no shoulder pain today   11.  Patient will report 10% improvement on QuickDash.  Baseline: 38.6 / 100 = 38.6 %  Goal status: MET 13.6%   PLAN:  PT FREQUENCY: 2x/week  PT DURATION: 6 weeks  PLANNED INTERVENTIONS: Therapeutic exercises, Therapeutic activity, Neuromuscular re-education, Balance training, Gait training, Patient/Family education, Self Care, Joint mobilization, Aquatic Therapy, Dry Needling, Electrical stimulation, Spinal mobilization, Cryotherapy, Moist heat, Taping, Ultrasound, Ionotophoresis '4mg'$ /ml Dexamethasone, and Manual therapy  PLAN FOR NEXT SESSION: Reassess back, sciatica, TrDN for sciatica.    Rennie Natter, PT, DPT  08/28/2022, 6:20 PM

## 2022-08-28 NOTE — Addendum Note (Signed)
Addended by: Rennie Natter on: 08/28/2022 06:26 PM   Modules accepted: Orders

## 2022-08-30 ENCOUNTER — Telehealth: Payer: Self-pay | Admitting: Internal Medicine

## 2022-08-30 NOTE — Telephone Encounter (Signed)
Patient states she went to sports medicine and they advised her that she will probably need to get epidural shots for her back but it needs to be approved by PCP first. Please advice.

## 2022-08-30 NOTE — Telephone Encounter (Signed)
Good afternoon, I reviewed your last note- are you needing approval from Dr. Larose Kells- or can you place order for epidural injection?

## 2022-08-31 ENCOUNTER — Other Ambulatory Visit: Payer: Self-pay | Admitting: Family Medicine

## 2022-08-31 DIAGNOSIS — M5416 Radiculopathy, lumbar region: Secondary | ICD-10-CM

## 2022-09-11 NOTE — Therapy (Signed)
OUTPATIENT PHYSICAL THERAPY TREATMENT   Progress Note Reporting Period 07/03/2022 to 08/28/2022  See note below for Objective Data and Assessment of Progress/Goals.     Patient Name: Marie Hebert MRN: [id number] DOB:[birth year], 89 y.o., female Today's Date: 08/28/2022  END OF SESSION:  PT End of Session - 08/28/22 1408     Visit Number 16    Number of Visits 22    Date for PT Re-Evaluation 10/09/22    Authorization Type [employer] MCR    Progress Note Due on Visit 20    PT Start Time 1408    PT Stop Time 1445    PT Time Calculation (min) 37 min    Activity Tolerance Patient tolerated treatment well    Behavior During Therapy WFL for tasks assessed/performed                    Past Medical History:  Diagnosis Date   Back pain    Dizziness    Hypertension    Palpitations    Past Surgical History:  Procedure Laterality Date   CATARACT EXTRACTION  2010   right   COLONOSCOPY  2003   LAPAROSCOPIC APPENDECTOMY N/A 09/25/2020   Procedure: APPENDECTOMY LAPAROSCOPIC;  Surgeon: Mickeal Skinner, MD;  Location: [facility];  Service: General;  Laterality: N/A;   RETINAL LASER PROCEDURE     Patient Active Problem List   Diagnosis Date Noted   Lumbar radiculopathy 07/25/2022   Soft tissue mass 07/25/2022   Osteopenia of neck of right femur 07/25/2022   Early dry stage nonexudative age-related macular degeneration of both eyes 01/10/2022   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 01/10/2022   Status post surgery 09/25/2020   Essential hypertension 07/12/2017   PAC (premature atrial contraction) 07/12/2017   Mitral regurgitation 07/12/2017   Onychomycosis 08/10/2015   PCP NOTES>>> 04/06/2015   Skin lesion 03/18/2013   Annual physical exam 12/31/2011   DJD (degenerative joint disease) 12/31/2011   Vitamin D deficiency 12/31/2011   Recurrent UTI 10/10/2011   PALPITATIONS, OCCASIONAL 10/14/2007   BACK PAIN 09/16/2007    PCP: Colon Branch, MD    REFERRING PROVIDER: Colon Branch, MD   REFERRING DIAG: [phone] (ICD-10-CM) - Right knee pain, unspecified chronicity  THERAPY DIAG:  Chronic pain of right knee  Stiffness of right knee, not elsewhere classified  Cramp and spasm  Unsteadiness on feet  Other low back pain  Rationale for Evaluation and Treatment: Rehabilitation  ONSET DATE: March/April 2023  SUBJECTIVE:   SUBJECTIVE STATEMENT:   ***  PERTINENT HISTORY:  LBP  PAIN:  Are you having pain? Yes: NPRS scale: 0/10 Pain location: L UT  Pain description: tightness    PRECAUTIONS: None  WEIGHT BEARING RESTRICTIONS: No  FALLS:  Has patient fallen in last 6 months? No  LIVING ENVIRONMENT: Lives with: lives alone Lives in: House/apartment Stairs: No Has following equipment at home: Single point cane  OCCUPATION: retired  PLOF: Independent  PATIENT GOALS: Get rid of her knee pain and know what is wrong.  NEXT MD VISIT: 10/29/22   OBJECTIVE:   DIAGNOSTIC FINDINGS:  [place] at Dr. Delilah Shan - negative per pt  2022: AP, lateral views of right knee reviewed.  Moderate degenerative changes  with joint space narrowing and subchondral sclerosis noted with marginal  osteophytes in the medial compartment primarily with some findings in the  lateral compartment as well.  No significant patellofemoral arthritis  noted.  No fracture or dislocation.  PATIENT SURVEYS:  LEFS 19 / 80 = 23.8 %  COGNITION: Overall cognitive status: Within functional limits for tasks assessed     SENSATION: WFL  MUSCLE LENGTH: HS: mild tightness Quads: mild tightness ITB: tight R Piriformis: mild R Hip Flexors: mild R Heelcords: mild R   POSTURE: No Significant postural limitations  PALPATION: Palpation: Marked TTP at R gastroc, HS, quads, ITB, R gluteals. Increased tone in post knee. Patellar Mobility: WNL bil  LOWER EXTREMITY ROM:  A/P ROM Right eval Left eval Right  AROM 07/03/2022 Right  AROM 2/52024  Hip  flexion [facility] WFL    Knee flexion 103/116* 125 105 106  Knee extension -5/-2 full -5 -2   (Blank rows = not tested) * pain  LOWER EXTREMITY MMT:  MMT Right eval Left eval  Hip flexion 4   Hip extension 5   Hip abduction 5   Hip adduction    Hip internal rotation    Hip external rotation    Knee flexion 5 mild pain   Knee extension 5   Ankle dorsiflexion 5   Ankle plantarflexion    Ankle inversion    Ankle eversion     (Blank rows = not tested)   FUNCTIONAL TESTS:  Difficulty with sit to stand transfer due to knee pain  GAIT: Distance walked:  40 Assistive device utilized: Single point cane Level of assistance: Modified independence Comments: antalgic, uses cane in R UE; cane adjusted to correct height  06/05/22 Low back assessed:  Full functional ROM, pain with UPA and CPA mobs to bil L3/4, L4/5, L5/S1 R >L - Pain decreases with repeated mobs. Marked tightness in bil gluteals, piriformis and lumbar paraspinals  R>L. Patient limited with prone press ups due to arm weakness.   07/24/2022 SHOULDER ASSESSMENT Posture: forward head, rounded shoulders  CERVICAL ROM:  AROM 07/24/2022 08/28/2022  Cervical Flexion 30 35  Cervical Extension 10 35  Cervical Rotation to Left 45 45  Cervical Rotation to Right 45 45   UPPER EXTREMITY MMT:  MMT Right 07/24/2022 Left 07/24/2022  Shoulder flexion 4 5  Shoulder extension    Shoulder abduction 4 5  Shoulder internal rotation 5 5  Shoulder external rotation 5 5  Elbow flexion 5 5  Elbow extension 5 5  Wrist flexion 5 5  Wrist extension 5 5  Grip strength good good   (Blank rows = not tested)  UPPER EXTREMITY ROM:  Active ROM Right eval Left eval  Shoulder flexion 90 (P 150)  130  Shoulder extension 40 62  Shoulder abduction 90 145  Shoulder internal rotation Functional low back ; 50  Functional to upper back T6; 80  Shoulder external rotation 70 85   (Blank rows = not tested) Right hand dominant  Palpation:  tenderness/tightness R upper trap, R levator scapulae, R cervical paraspinals, R supraspinatus  DGI: 19/24 on 07/30/22 Gait x 600 feet  - gait speed of 2.7 ft/sec on 07/30/22 Stairs - reciprocal gait ascending with required one UE support; descent step to gait and one UE due to pain in R knee.  TODAY'S TREATMENT:  DATE:  09/12/22 Therapeutic Exercise: to improve strength and mobility.  Demo, verbal and tactile cues throughout for technique. UBE L1 x 6 min Supine pelvic tilts x 10 Supine LTR x 10 both ways Piriformis stretch x 30 KTOS  Sciatic nerve glide x 10 - L side limited by hip flexor pain Seated horiz ABD RTB x 10 Seated B ER RTB x 10 Wall slide flexion x 10    08/28/2022 Therapeutic Activity:  progress towards goals.  QuickDash, cervical ROM.  Self Care: Education on anatomy, referral patterns, different kinds of "epidurals"- and what the epidural injections are vs. Epidurals for childbirth, review of HEP, plan of care.  Manual Therapy: to decrease muscle spasm and pain and improve mobility STM/TPR to cervical paraspinals and UT, skilled palpation and monitoring during dry needling. Trigger Point Dry-Needling  Treatment instructions: Expect mild to moderate muscle soreness. S/S of pneumothorax if dry needled over a lung field, and to seek immediate medical attention should they occur. Patient verbalized understanding of these instructions and education. Patient Consent Given: Yes Education handout provided: Previously provided Muscles treated: R UT, R infraspinatus Electrical stimulation performed: No Parameters: N/A Treatment response/outcome: Twitch Response Elicited and Palpable Increase in Muscle Length   08/22/22  Therapeutic Exercise: to improve strength and mobility.  Demo, verbal and tactile cues throughout for technique. UBE L1 x 6 min Supine pelvic  tilts x 10 Supine LTR x 10 both ways Piriformis stretch x 30 KTOS  Sciatic nerve glide x 10 - L side limited by hip flexor pain Seated horiz ABD RTB x 10 Seated B ER RTB x 10 Wall slide flexion x 10    PATIENT EDUCATION:  Education details: HEP Review Person educated: Patient Education method: Explanation, Demonstration, Tactile cues, Verbal cues, and Handouts Education comprehension: verbalized understanding and returned demonstration  HOME EXERCISE PROGRAM: Access Code: [email or web address] URL: [email or web address] Date: 08/14/2022 Prepared by: Glenetta Hew  Exercises Added   - Lower Trunk Rotations  - 1 x daily - 7 x weekly - 1 sets - 10 reps - Supine Posterior Pelvic Tilt  - 1 x daily - 7 x weekly - 1 sets - 10 reps - Supine Piriformis Stretch with Foot on Ground  - 1 x daily - 7 x weekly - 1 sets - 3 reps - 30 sec  hold - Supine Sciatic Nerve Glide  - 1 x daily - 7 x weekly - 1 sets - 10 reps   ASSESSMENT:  CLINICAL IMPRESSION:  Anye E Grabill ***    OBJECTIVE IMPAIRMENTS: Abnormal gait, decreased balance, decreased ROM, decreased strength, increased fascial restrictions, increased muscle spasms, impaired flexibility, obesity, and pain.   ACTIVITY LIMITATIONS: standing, transfers, and locomotion level  PARTICIPATION LIMITATIONS: meal prep and community activity  PERSONAL FACTORS: Time since onset of injury/illness/exacerbation and 1 comorbidity: LBP  are also affecting patient's functional outcome.   REHAB POTENTIAL: Excellent  CLINICAL DECISION MAKING: Stable/uncomplicated  EVALUATION COMPLEXITY: Low   GOALS: Goals reviewed with patient? Yes  SHORT TERM GOALS: Target date: 06/06/2022   Patient will be independent with initial HEP. Baseline:  Goal status: MET  2.  Balance assesment completed Baseline:  Goal status: MET  LONG TERM GOALS: Target date: 07/04/2022 extended to 08/28/2022, 10/09/22  Patient will be independent with  advanced/ongoing HEP to improve outcomes and carryover.  Baseline:  Goal status: IN PROGRESS 07/30/22- not fully compliant due to knee pain  2.  Patient will report at least 75% improvement in R knee pain to improve  QOL. Baseline:  Goal status: IN PROGRESS- 07/30/22 - reports 2% improvement  3.  Patient will demonstrate improved R knee AROM to >/= 2--120 deg to allow for normal gait and stair mechanics. Baseline:  Goal status: IN PROGRESS 07/30/22 -see objective.   4.  Patient will demonstrate improved functional LE strength as demonstrated by being able to complete 5xSTS in <=14.8 sec. Baseline:  Goal status: MET 07/03/2022- 12.4 seconds, with increased knee pain.   5.  Patient will be able to ambulate 600' with LRAD and normal gait pattern without increased pain to access community.  Baseline:  Goal status: IN PROGRESS 07/03/22- noted increased gait deviation after 450', 07/30/22 - able to amb 600 ft with mild gait deviations deviating from straight path and decreased heel strike bil as well as decreased step length L.  6. Patient will be stand to cook meals without increased R knee pain. Baseline:  Goal status: IN PROGRESS 07/30/22 limited to 10-15 min  7.  Patient will report 28/80 on LEFS  to demonstrate improved functional ability. Baseline: 19 / 80 = 23.8 % at eval, 29 / 80 = 36.3 % on 07/30/22 Goal status: MET  8.  Patient will demonstrate at least 19/24 on DGI to decrease risk of falls. Baseline: 17/24, 19/24 on 07/30/22 Goal status: MET   9.  Patient will be able to sleep without waking from knee pain Baseline:  Goal status: IN PROGRESS 08/28/22- still having difficulty sleeping due to knee pain  10.  Patient will report 50% improvement in Right shoulder pain to improve QOL.  Baseline:  Goal status: MET 08/14/22- met, no shoulder pain today   11.  Patient will report 10% improvement on QuickDash.  Baseline: 38.6 / 100 = 38.6 %  Goal status: MET 13.6%   PLAN:  PT FREQUENCY:  2x/week  PT DURATION: 6 weeks  PLANNED INTERVENTIONS: Therapeutic exercises, Therapeutic activity, Neuromuscular re-education, Balance training, Gait training, Patient/Family education, Self Care, Joint mobilization, Aquatic Therapy, Dry Needling, Electrical stimulation, Spinal mobilization, Cryotherapy, Moist heat, Taping, Ultrasound, Ionotophoresis 4mg /ml Dexamethasone, and Manual therapy  PLAN FOR NEXT SESSION: Reassess back, sciatica, TrDN for sciatica.    Rennie Natter, PT 08/28/2022, 6:20 PM

## 2022-09-12 ENCOUNTER — Ambulatory Visit: Payer: 59 | Admitting: Physical Therapy

## 2022-09-12 ENCOUNTER — Encounter: Payer: Self-pay | Admitting: Physical Therapy

## 2022-09-12 DIAGNOSIS — R252 Cramp and spasm: Secondary | ICD-10-CM | POA: Diagnosis not present

## 2022-09-12 DIAGNOSIS — M25561 Pain in right knee: Secondary | ICD-10-CM | POA: Diagnosis not present

## 2022-09-12 DIAGNOSIS — M5459 Other low back pain: Secondary | ICD-10-CM | POA: Diagnosis not present

## 2022-09-12 DIAGNOSIS — G8929 Other chronic pain: Secondary | ICD-10-CM | POA: Diagnosis not present

## 2022-09-12 DIAGNOSIS — R2681 Unsteadiness on feet: Secondary | ICD-10-CM | POA: Diagnosis not present

## 2022-09-12 DIAGNOSIS — M25661 Stiffness of right knee, not elsewhere classified: Secondary | ICD-10-CM | POA: Diagnosis not present

## 2022-09-14 ENCOUNTER — Ambulatory Visit
Admission: RE | Admit: 2022-09-14 | Discharge: 2022-09-14 | Disposition: A | Discharge status: Home / Self Care | Payer: 59 | Source: Ambulatory Visit | Attending: Family Medicine | Admitting: Family Medicine

## 2022-09-14 DIAGNOSIS — M5416 Radiculopathy, lumbar region: Secondary | ICD-10-CM

## 2022-09-14 MED ORDER — IOPAMIDOL (ISOVUE-M 200) INJECTION 41%
1.0000 mL | Freq: Once | INTRAMUSCULAR | Status: AC
  Administered 2022-09-14: 1 mL via EPIDURAL

## 2022-09-14 MED ORDER — METHYLPREDNISOLONE ACETATE 40 MG/ML INJ SUSP (RADIOLOG
80.0000 mg | Freq: Once | INTRAMUSCULAR | Status: AC
  Administered 2022-09-14: 80 mg via EPIDURAL

## 2022-09-14 NOTE — Discharge Instructions (Signed)

## 2022-09-19 ENCOUNTER — Ambulatory Visit: Payer: 59 | Admitting: Physical Therapy

## 2022-09-25 ENCOUNTER — Ambulatory Visit: Payer: 59 | Attending: Internal Medicine | Admitting: Physical Therapy

## 2022-09-25 ENCOUNTER — Encounter: Payer: Self-pay | Admitting: Physical Therapy

## 2022-09-25 DIAGNOSIS — M5459 Other low back pain: Secondary | ICD-10-CM | POA: Diagnosis not present

## 2022-09-25 DIAGNOSIS — R252 Cramp and spasm: Secondary | ICD-10-CM | POA: Diagnosis not present

## 2022-09-25 DIAGNOSIS — G8929 Other chronic pain: Secondary | ICD-10-CM | POA: Diagnosis not present

## 2022-09-25 DIAGNOSIS — M25561 Pain in right knee: Secondary | ICD-10-CM | POA: Insufficient documentation

## 2022-09-25 DIAGNOSIS — M25511 Pain in right shoulder: Secondary | ICD-10-CM | POA: Diagnosis not present

## 2022-09-25 DIAGNOSIS — M25611 Stiffness of right shoulder, not elsewhere classified: Secondary | ICD-10-CM | POA: Diagnosis not present

## 2022-09-25 DIAGNOSIS — M25661 Stiffness of right knee, not elsewhere classified: Secondary | ICD-10-CM | POA: Diagnosis not present

## 2022-09-25 NOTE — Therapy (Signed)
OUTPATIENT PHYSICAL THERAPY TREATMENT     Patient Name: Marie Hebert MRN: [id number] DOB:27-Jun-1932, 89 y.o., female Today's Date: 09/25/2022  END OF SESSION:  PT End of Session - 09/25/22 1400     Visit Number 18    Number of Visits 22    Date for PT Re-Evaluation 10/09/22    Authorization Type [employer] MCR    Progress Note Due on Visit 20    PT Start Time 1400    PT Stop Time 1430    PT Time Calculation (min) 30 min    Activity Tolerance Patient tolerated treatment well    Behavior During Therapy WFL for tasks assessed/performed                    Past Medical History:  Diagnosis Date   Back pain    Dizziness    Hypertension    Palpitations    Past Surgical History:  Procedure Laterality Date   CATARACT EXTRACTION  2010   right   COLONOSCOPY  2003   LAPAROSCOPIC APPENDECTOMY N/A 09/25/2020   Procedure: APPENDECTOMY LAPAROSCOPIC;  Surgeon: Mickeal Skinner, MD;  Location: [facility];  Service: General;  Laterality: N/A;   RETINAL LASER PROCEDURE     Patient Active Problem List   Diagnosis Date Noted   Lumbar radiculopathy 07/25/2022   Soft tissue mass 07/25/2022   Osteopenia of neck of right femur 07/25/2022   Early dry stage nonexudative age-related macular degeneration of both eyes 01/10/2022   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 01/10/2022   Status post surgery 09/25/2020   Essential hypertension 07/12/2017   PAC (premature atrial contraction) 07/12/2017   Mitral regurgitation 07/12/2017   Onychomycosis 08/10/2015   PCP NOTES>>> 04/06/2015   Skin lesion 03/18/2013   Annual physical exam 12/31/2011   DJD (degenerative joint disease) 12/31/2011   Vitamin D deficiency 12/31/2011   Recurrent UTI 10/10/2011   PALPITATIONS, OCCASIONAL 10/14/2007   BACK PAIN 09/16/2007    PCP: Colon Branch, MD   REFERRING PROVIDER: Colon Branch, MD   REFERRING DIAG: [phone] (ICD-10-CM) - Right knee pain, unspecified chronicity  THERAPY  DIAG:  Other low back pain  Cramp and spasm  Chronic pain of right knee  Stiffness of right knee, not elsewhere classified  Rationale for Evaluation and Treatment: Rehabilitation  ONSET DATE: March/April 2023  SUBJECTIVE:   SUBJECTIVE STATEMENT:   "I have no energy today, I don't know why"     PERTINENT HISTORY:  LBP  PAIN:  Are you having pain? Yes: NPRS scale: 4/10 Pain location: R post knee   Pain description: pain, swelling    PRECAUTIONS: None  WEIGHT BEARING RESTRICTIONS: No  FALLS:  Has patient fallen in last 6 months? No  LIVING ENVIRONMENT: Lives with: lives alone Lives in: House/apartment Stairs: No Has following equipment at home: Single point cane  OCCUPATION: retired  PLOF: Independent  PATIENT GOALS: Get rid of her knee pain and know what is wrong.  NEXT MD VISIT: 10/29/22   OBJECTIVE:   DIAGNOSTIC FINDINGS:  [place] at Dr. Delilah Shan - negative per pt  2022: AP, lateral views of right knee reviewed.  Moderate degenerative changes  with joint space narrowing and subchondral sclerosis noted with marginal  osteophytes in the medial compartment primarily with some findings in the  lateral compartment as well.  No significant patellofemoral arthritis  noted.  No fracture or dislocation.   PATIENT SURVEYS:  LEFS 19 / 80 = 23.8 %  COGNITION: Overall cognitive status: Within functional limits for tasks assessed     SENSATION: WFL  MUSCLE LENGTH: HS: mild tightness Quads: mild tightness ITB: tight R Piriformis: mild R Hip Flexors: mild R Heelcords: mild R   POSTURE: No Significant postural limitations  PALPATION: Palpation: Marked TTP at R gastroc, HS, quads, ITB, R gluteals. Increased tone in post knee. Patellar Mobility: WNL bil  LOWER EXTREMITY ROM:  A/P ROM Right eval Left eval Right  AROM 07/03/2022 Right  AROM 2/52024  Hip flexion [facility] WFL    Knee flexion 103/116* 125 105 106  Knee extension -5/-2 full -5 -2   (Blank rows =  not tested) * pain  LOWER EXTREMITY MMT:  MMT Right eval Left eval  Hip flexion 4   Hip extension 5   Hip abduction 5   Hip adduction    Hip internal rotation    Hip external rotation    Knee flexion 5 mild pain   Knee extension 5   Ankle dorsiflexion 5   Ankle plantarflexion    Ankle inversion    Ankle eversion     (Blank rows = not tested)   FUNCTIONAL TESTS:  Difficulty with sit to stand transfer due to knee pain  GAIT: Distance walked:  40 Assistive device utilized: Single point cane Level of assistance: Modified independence Comments: antalgic, uses cane in R UE; cane adjusted to correct height  06/05/22 Low back assessed:  Full functional ROM, pain with UPA and CPA mobs to bil L3/4, L4/5, L5/S1 R >L - Pain decreases with repeated mobs. Marked tightness in bil gluteals, piriformis and lumbar paraspinals  R>L. Patient limited with prone press ups due to arm weakness.   07/24/2022 SHOULDER ASSESSMENT Posture: forward head, rounded shoulders  CERVICAL ROM:  AROM 07/24/2022 08/28/2022  Cervical Flexion 30 35  Cervical Extension 10 35  Cervical Rotation to Left 45 45  Cervical Rotation to Right 45 45   UPPER EXTREMITY MMT:  MMT Right 07/24/2022 Left 07/24/2022  Shoulder flexion 4 5  Shoulder extension    Shoulder abduction 4 5  Shoulder internal rotation 5 5  Shoulder external rotation 5 5  Elbow flexion 5 5  Elbow extension 5 5  Wrist flexion 5 5  Wrist extension 5 5  Grip strength good good   (Blank rows = not tested)  UPPER EXTREMITY ROM:  Active ROM Right eval Left eval  Shoulder flexion 90 (P 150)  130  Shoulder extension 40 62  Shoulder abduction 90 145  Shoulder internal rotation Functional low back ; 50  Functional to upper back T6; 80  Shoulder external rotation 70 85   (Blank rows = not tested) Right hand dominant  Palpation: tenderness/tightness R upper trap, R levator scapulae, R cervical paraspinals, R supraspinatus  DGI: 19/24 on  07/30/22 Gait x 600 feet  - gait speed of 2.7 ft/sec on 07/30/22 Stairs - reciprocal gait ascending with required one UE support; descent step to gait and one UE due to pain in R knee.  TODAY'S TREATMENT:  DATE:  09/25/22 Therapeutic Activity:  to improve safety and mobility Nustep L5 x 5 min Gait in hallway with head turns Corner balance exercises - eyes open with head turns x 10, head nods x 10, repeated with eyes closed Self Care: Education on cramp prevention, recommendation for compression socks.   09/12/22 Therapeutic Exercise: to improve strength and mobility.  Demo, verbal and tactile cues throughout for technique. Nustep L5  x 6 min Supine pelvic tilts x 3 TA contraction with marching x 10, sequential x 5 hurts knee Supine LTR x 10 both ways Piriformis stretch x 30 KTOS  Sciatic nerve glide x 5   Manual Therapy: to decrease muscle spasm and pain and improve mobility IASTM to cervical paraspinals and UT, skilled palpation and monitoring during dry needling. Trigger Point Dry-Needling  Treatment instructions: Expect mild to moderate muscle soreness. S/S of pneumothorax if dry needled over a lung field, and to seek immediate medical attention should they occur. Patient verbalized understanding of these instructions and education. Patient Consent Given: Yes Education handout provided: Previously provided Muscles treated: R UT, R infraspinatus Electrical stimulation performed: No Parameters: N/A Treatment response/outcome: Twitch Response Elicited and Palpable Increase in Muscle Length   08/28/2022 Therapeutic Activity:  progress towards goals.  QuickDash, cervical ROM.  Self Care: Education on anatomy, referral patterns, different kinds of "epidurals"- and what the epidural injections are vs. Epidurals for childbirth, review of HEP, plan of care.  Manual  Therapy: to decrease muscle spasm and pain and improve mobility STM/TPR to cervical paraspinals and UT, skilled palpation and monitoring during dry needling. Trigger Point Dry-Needling  Treatment instructions: Expect mild to moderate muscle soreness. S/S of pneumothorax if dry needled over a lung field, and to seek immediate medical attention should they occur. Patient verbalized understanding of these instructions and education. Patient Consent Given: Yes Education handout provided: Previously provided Muscles treated: R UT, R infraspinatus Electrical stimulation performed: No Parameters: N/A Treatment response/outcome: Twitch Response Elicited and Palpable Increase in Muscle Length   PATIENT EDUCATION:  Education details: HEP Review Person educated: Patient Education method: Explanation, Demonstration, Tactile cues, Verbal cues, and Handouts Education comprehension: verbalized understanding and returned demonstration  HOME EXERCISE PROGRAM: Access Code: [email or web address] URL: [email or web address] Date: 08/14/2022 Prepared by: Glenetta Hew  Exercises Added   - Lower Trunk Rotations  - 1 x daily - 7 x weekly - 1 sets - 10 reps - Supine Posterior Pelvic Tilt  - 1 x daily - 7 x weekly - 1 sets - 10 reps - Supine Piriformis Stretch with Foot on Ground  - 1 x daily - 7 x weekly - 1 sets - 3 reps - 30 sec  hold - Supine Sciatic Nerve Glide  - 1 x daily - 7 x weekly - 1 sets - 10 reps   ASSESSMENT:  CLINICAL IMPRESSION:  Marie Hebert reports overall improvement in pain after epidural injections, denying any back or shoulder pain today.  She is still having pain in R knee and swelling.  Noted that both legs are swollen, large varicose veins in R calf and tenderness with calf squeeze, discussed trying compression socks to see if that improves both swelling and pain.   Also discussed different remedies for cramps, recommended avoiding pointing her toes when stretching. She  also reports that she is walking more but often feels unsteady.  Noted today that with head turns she turns entire body direction of head turn, and also slows significantly with looking up.  She denies  any dizziness with the head turns, just unsteadiness.  Given corner standing balance habituation exercises to address, can place a chair in front for additional safety.  Due to fatigue she declined further interventions today.  Marie Hebert continues to demonstrate potential for improvement and would benefit from continued skilled therapy to address impairments.       OBJECTIVE IMPAIRMENTS: Abnormal gait, decreased balance, decreased ROM, decreased strength, increased fascial restrictions, increased muscle spasms, impaired flexibility, obesity, and pain.   ACTIVITY LIMITATIONS: standing, transfers, and locomotion level  PARTICIPATION LIMITATIONS: meal prep and community activity  PERSONAL FACTORS: Time since onset of injury/illness/exacerbation and 1 comorbidity: LBP  are also affecting patient's functional outcome.   REHAB POTENTIAL: Excellent  CLINICAL DECISION MAKING: Stable/uncomplicated  EVALUATION COMPLEXITY: Low   GOALS: Goals reviewed with patient? Yes  SHORT TERM GOALS: Target date: 06/06/2022   Patient will be independent with initial HEP. Baseline:  Goal status: MET  2.  Balance assesment completed Baseline:  Goal status: MET  LONG TERM GOALS: Target date: 07/04/2022 extended to 08/28/2022, 10/09/22  Patient will be independent with advanced/ongoing HEP to improve outcomes and carryover.  Baseline:  Goal status: IN PROGRESS 07/30/22- not fully compliant due to knee pain  2.  Patient will report at least 75% improvement in R knee pain to improve QOL. Baseline:  Goal status: IN PROGRESS- 07/30/22 - reports 2% improvement  3.  Patient will demonstrate improved R knee AROM to >/= 2--120 deg to allow for normal gait and stair mechanics. Baseline:  Goal status: IN  PROGRESS 07/30/22 -see objective.   4.  Patient will demonstrate improved functional LE strength as demonstrated by being able to complete 5xSTS in <=14.8 sec. Baseline:  Goal status: MET 07/03/2022- 12.4 seconds, with increased knee pain.   5.  Patient will be able to ambulate 600' with LRAD and normal gait pattern without increased pain to access community.  Baseline:  Goal status: IN PROGRESS 07/03/22- noted increased gait deviation after 450', 07/30/22 - able to amb 600 ft with mild gait deviations deviating from straight path and decreased heel strike bil as well as decreased step length L.  09/25/22- unsteadiness with head turns.   6. Patient will be stand to cook meals without increased R knee pain. Baseline:  Goal status: IN PROGRESS 07/30/22 limited to 10-15 min  7.  Patient will report 28/80 on LEFS  to demonstrate improved functional ability. Baseline: 19 / 80 = 23.8 % at eval, 29 / 80 = 36.3 % on 07/30/22 Goal status: MET  8.  Patient will demonstrate at least 19/24 on DGI to decrease risk of falls. Baseline: 17/24, 19/24 on 07/30/22 Goal status: MET   9.  Patient will be able to sleep without waking from knee pain Baseline:  Goal status: IN PROGRESS 08/28/22- still having difficulty sleeping due to knee pain  09/25/22- starting to sleep much better now.   10.  Patient will report 50% improvement in Right shoulder pain to improve QOL.  Baseline:  Goal status: MET 08/14/22- met, no shoulder pain today   11.  Patient will report 10% improvement on QuickDash.  Baseline: 38.6 / 100 = 38.6 %  Goal status: MET 13.6%   PLAN:  PT FREQUENCY: 2x/week  PT DURATION: 6 weeks  PLANNED INTERVENTIONS: Therapeutic exercises, Therapeutic activity, Neuromuscular re-education, Balance training, Gait training, Patient/Family education, Self Care, Joint mobilization, Aquatic Therapy, Dry Needling, Electrical stimulation, Spinal mobilization, Cryotherapy, Moist heat, Taping, Ultrasound, Ionotophoresis  4mg /ml Dexamethasone, and Manual  therapy  PLAN FOR NEXT SESSION: assess response to epidural, assess shoulder response to MT/DN progress TE as tolerated.    Rennie Natter, PT 09/25/2022, 2:45 PM

## 2022-10-02 ENCOUNTER — Ambulatory Visit: Payer: 59 | Admitting: Physical Therapy

## 2022-10-02 ENCOUNTER — Encounter: Payer: Self-pay | Admitting: Physical Therapy

## 2022-10-02 DIAGNOSIS — M25511 Pain in right shoulder: Secondary | ICD-10-CM | POA: Diagnosis not present

## 2022-10-02 DIAGNOSIS — G8929 Other chronic pain: Secondary | ICD-10-CM

## 2022-10-02 DIAGNOSIS — R252 Cramp and spasm: Secondary | ICD-10-CM | POA: Diagnosis not present

## 2022-10-02 DIAGNOSIS — M25661 Stiffness of right knee, not elsewhere classified: Secondary | ICD-10-CM | POA: Diagnosis not present

## 2022-10-02 DIAGNOSIS — M25561 Pain in right knee: Secondary | ICD-10-CM | POA: Diagnosis not present

## 2022-10-02 DIAGNOSIS — M25611 Stiffness of right shoulder, not elsewhere classified: Secondary | ICD-10-CM | POA: Diagnosis not present

## 2022-10-02 DIAGNOSIS — M5459 Other low back pain: Secondary | ICD-10-CM

## 2022-10-02 NOTE — Therapy (Signed)
OUTPATIENT PHYSICAL THERAPY TREATMENT     Patient Name: Marie Hebert MRN: [id number] DOB:02-10-1933, 89 y.o., female Today's Date: 10/02/2022  END OF SESSION:  PT End of Session - 10/02/22 1319     Visit Number 19    Number of Visits 22    Date for PT Re-Evaluation 10/09/22    Authorization Type [employer] MCR    Progress Note Due on Visit 20    PT Start Time 1318    Activity Tolerance Patient tolerated treatment well    Behavior During Therapy [facility] for tasks assessed/performed                    Past Medical History:  Diagnosis Date   Back pain    Dizziness    Hypertension    Palpitations    Past Surgical History:  Procedure Laterality Date   CATARACT EXTRACTION  2010   right   COLONOSCOPY  2003   LAPAROSCOPIC APPENDECTOMY N/A 09/25/2020   Procedure: APPENDECTOMY LAPAROSCOPIC;  Surgeon: Rodman Pickle, MD;  Location: [facility] OR;  Service: General;  Laterality: N/A;   RETINAL LASER PROCEDURE     Patient Active Problem List   Diagnosis Date Noted   Lumbar radiculopathy 07/25/2022   Soft tissue mass 07/25/2022   Osteopenia of neck of right femur 07/25/2022   Early dry stage nonexudative age-related macular degeneration of both eyes 01/10/2022   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 01/10/2022   Status post surgery 09/25/2020   Essential hypertension 07/12/2017   PAC (premature atrial contraction) 07/12/2017   Mitral regurgitation 07/12/2017   Onychomycosis 08/10/2015   PCP NOTES>>> 04/06/2015   Skin lesion 03/18/2013   Annual physical exam 12/31/2011   DJD (degenerative joint disease) 12/31/2011   Vitamin D deficiency 12/31/2011   Recurrent UTI 10/10/2011   PALPITATIONS, OCCASIONAL 10/14/2007   BACK PAIN 09/16/2007    PCP: Wanda Plump, MD   REFERRING PROVIDER: Wanda Plump, MD   REFERRING DIAG: [phone] (ICD-10-CM) - Right knee pain, unspecified chronicity  THERAPY DIAG:  Other low back pain  Cramp and spasm  Chronic pain  of right knee  Rationale for Evaluation and Treatment: Rehabilitation  ONSET DATE: March/April 2023  SUBJECTIVE:   SUBJECTIVE STATEMENT:   "Feeling much better than last week"   Pain in the back of her R knee is mostly at night now, cramps at night.  During the day just a little, she can ignore it, better after the shot.   She did soak her feet in epsom salt before bed last night and did not have cramps.  R shoulder bothering her again, 6/10.   PERTINENT HISTORY:  LBP  PAIN:  Are you having pain? Yes: NPRS scale: 4/10 Pain location: R post knee   Pain description: pain, swelling    PRECAUTIONS: None  WEIGHT BEARING RESTRICTIONS: No  FALLS:  Has patient fallen in last 6 months? No  LIVING ENVIRONMENT: Lives with: lives alone Lives in: House/apartment Stairs: No Has following equipment at home: Single point cane  OCCUPATION: retired  PLOF: Independent  PATIENT GOALS: Get rid of her knee pain and know what is wrong.  NEXT MD VISIT: 10/29/22   OBJECTIVE:   DIAGNOSTIC FINDINGS:  [place] at Dr. Penni Bombard - negative per pt  2022: AP, lateral views of right knee reviewed.  Moderate degenerative changes  with joint space narrowing and subchondral sclerosis noted with marginal  osteophytes in the medial compartment primarily with some findings  in the  lateral compartment as well.  No significant patellofemoral arthritis  noted.  No fracture or dislocation.   PATIENT SURVEYS:  LEFS 19 / 80 = 23.8 %  COGNITION: Overall cognitive status: Within functional limits for tasks assessed     SENSATION: WFL  MUSCLE LENGTH: HS: mild tightness Quads: mild tightness ITB: tight R Piriformis: mild R Hip Flexors: mild R Heelcords: mild R   POSTURE: No Significant postural limitations  PALPATION: Palpation: Marked TTP at R gastroc, HS, quads, ITB, R gluteals. Increased tone in post knee. Patellar Mobility: WNL bil  LOWER EXTREMITY ROM:  A/P ROM Right eval Left eval Right   AROM 07/03/2022 Right  AROM 2/52024  [facility]    Knee flexion 103/116* 125 105 106  Knee extension -5/-2 full -5 -2   (Blank rows = not tested) * pain  LOWER EXTREMITY MMT:  MMT Right eval Left eval  Hip flexion 4   Hip extension 5   Hip abduction 5   Hip adduction    Hip internal rotation    Hip external rotation    Knee flexion 5 mild pain   Knee extension 5   Ankle dorsiflexion 5   Ankle plantarflexion    Ankle inversion    Ankle eversion     (Blank rows = not tested)   FUNCTIONAL TESTS:  Difficulty with sit to stand transfer due to knee pain  GAIT: Distance walked:  40 Assistive device utilized: Single point cane Level of assistance: Modified independence Comments: antalgic, uses cane in R UE; cane adjusted to correct height  06/05/22 Low back assessed:  Full functional ROM, pain with UPA and CPA mobs to bil L3/4, L4/5, L5/S1 R >L - Pain decreases with repeated mobs. Marked tightness in bil gluteals, piriformis and lumbar paraspinals  R>L. Patient limited with prone press ups due to arm weakness.   07/24/2022 SHOULDER ASSESSMENT Posture: forward head, rounded shoulders  CERVICAL ROM:  AROM 07/24/2022 08/28/2022  Cervical Flexion 30 35  Cervical Extension 10 35  Cervical Rotation to Left 45 45  Cervical Rotation to Right 45 45   UPPER EXTREMITY MMT:  MMT Right 07/24/2022 Left 07/24/2022  Shoulder flexion 4 5  Shoulder extension    Shoulder abduction 4 5  Shoulder internal rotation 5 5  Shoulder external rotation 5 5  Elbow flexion 5 5  Elbow extension 5 5  Wrist flexion 5 5  Wrist extension 5 5  Grip strength good good   (Blank rows = not tested)  UPPER EXTREMITY ROM:  Active ROM Right eval Left eval  Shoulder flexion 90 (P 150)  130  Shoulder extension 40 62  Shoulder abduction 90 145  Shoulder internal rotation Functional low back ; 50  Functional to upper back T6; 80  Shoulder external rotation 70 85   (Blank rows = not tested)  Right hand dominant  Palpation: tenderness/tightness R upper trap, R levator scapulae, R cervical paraspinals, R supraspinatus  DGI: 19/24 on 07/30/22 Gait x 600 feet  - gait speed of 2.7 ft/sec on 07/30/22 Stairs - reciprocal gait ascending with required one UE support; descent step to gait and one UE due to pain in R knee.  TODAY'S TREATMENT:  DATE:   10/02/22 Therapeutic Exercise: to improve strength and mobility.  Demo, verbal and tactile cues throughout for technique. Nustep L5 x 5 min  L/S stretch, UT stretch 2 x 15 sec each bil  Chin tucks x 10 Manual Therapy: to decrease muscle spasm and pain and improve mobility STM/TPR to R gastroc, hamstrings, UT, L/s, cervical paraspinals skilled palpation and monitoring during dry needling. Trigger Point Dry-Needling  Treatment instructions: Expect mild to moderate muscle soreness. S/S of pneumothorax if dry needled over a lung field, and to seek immediate medical attention should they occur. Patient verbalized understanding of these instructions and education. Patient Consent Given: Yes Education handout provided: Previously provided Muscles treated: R bil hamstrings, gastroc heads, R UT.  Electrical stimulation performed: No Parameters: N/A Treatment response/outcome: Twitch Response Elicited and Palpable Increase in Muscle Length Self Care:shoe lift in L shoe, demo on how to adjust.   09/25/22 Therapeutic Activity:  to improve safety and mobility Nustep L5 x 5 min Gait in hallway with head turns Corner balance exercises - eyes open with head turns x 10, head nods x 10, repeated with eyes closed Self Care: Education on cramp prevention, recommendation for compression socks.   09/12/22 Therapeutic Exercise: to improve strength and mobility.  Demo, verbal and tactile cues throughout for technique. Nustep L5  x 6  min Supine pelvic tilts x 3 TA contraction with marching x 10, sequential x 5 hurts knee Supine LTR x 10 both ways Piriformis stretch x 30 KTOS  Sciatic nerve glide x 5   Manual Therapy: to decrease muscle spasm and pain and improve mobility IASTM to cervical paraspinals and UT, skilled palpation and monitoring during dry needling. Trigger Point Dry-Needling  Treatment instructions: Expect mild to moderate muscle soreness. S/S of pneumothorax if dry needled over a lung field, and to seek immediate medical attention should they occur. Patient verbalized understanding of these instructions and education. Patient Consent Given: Yes Education handout provided: Previously provided Muscles treated: R UT, R infraspinatus Electrical stimulation performed: No Parameters: N/A Treatment response/outcome: Twitch Response Elicited and Palpable Increase in Muscle Length   PATIENT EDUCATION:  Education details: HEP Review Person educated: Patient Education method: Medical illustrator Education comprehension: verbalized understanding and returned demonstration  HOME EXERCISE PROGRAM: Access Code: [email or web address] URL: [email or web address] Date: 08/14/2022 Prepared by: Harrie Foreman  Exercises Added   - Lower Trunk Rotations  - 1 x daily - 7 x weekly - 1 sets - 10 reps - Supine Posterior Pelvic Tilt  - 1 x daily - 7 x weekly - 1 sets - 10 reps - Supine Piriformis Stretch with Foot on Ground  - 1 x daily - 7 x weekly - 1 sets - 3 reps - 30 sec  hold - Supine Sciatic Nerve Glide  - 1 x daily - 7 x weekly - 1 sets - 10 reps   ASSESSMENT:  CLINICAL IMPRESSION:  Karishma E Sumlin reports she is feeling better today, R knee is better but still having cramping mostly at night, also felt as if RLE was shorter.  Noted that she does have mild R hamstring contracture, which functionally shortens RLE, so added heel lift to correct.  Also dry needled her R hamstring and calf  muscles with immediate decrease in muscle tension in these muscles.  She also reported tightness in R UT causing shoulder pain, improved after manual therapy as well.  Reviewed gentle stretches for cervical regiona as well.  Keshia E Beadle continues to demonstrate potential  for improvement and would benefit from continued skilled therapy to address impairments.       OBJECTIVE IMPAIRMENTS: Abnormal gait, decreased balance, decreased ROM, decreased strength, increased fascial restrictions, increased muscle spasms, impaired flexibility, obesity, and pain.   ACTIVITY LIMITATIONS: standing, transfers, and locomotion level  PARTICIPATION LIMITATIONS: meal prep and community activity  PERSONAL FACTORS: Time since onset of injury/illness/exacerbation and 1 comorbidity: LBP  are also affecting patient's functional outcome.   REHAB POTENTIAL: Excellent  CLINICAL DECISION MAKING: Stable/uncomplicated  EVALUATION COMPLEXITY: Low   GOALS: Goals reviewed with patient? Yes  SHORT TERM GOALS: Target date: 06/06/2022   Patient will be independent with initial HEP. Baseline:  Goal status: MET  2.  Balance assesment completed Baseline:  Goal status: MET  LONG TERM GOALS: Target date: 07/04/2022 extended to 08/28/2022, 10/09/22  Patient will be independent with advanced/ongoing HEP to improve outcomes and carryover.  Baseline:  Goal status: IN PROGRESS 07/30/22- not fully compliant due to knee pain  2.  Patient will report at least 75% improvement in R knee pain to improve QOL. Baseline:  Goal status: IN PROGRESS- 07/30/22 - reports 2% improvement  3.  Patient will demonstrate improved R knee AROM to >/= 2--120 deg to allow for normal gait and stair mechanics. Baseline:  Goal status: IN PROGRESS 07/30/22 -see objective.   4.  Patient will demonstrate improved functional LE strength as demonstrated by being able to complete 5xSTS in <=14.8 sec. Baseline:  Goal status: MET 07/03/2022- 12.4  seconds, with increased knee pain.   5.  Patient will be able to ambulate 600' with LRAD and normal gait pattern without increased pain to access community.  Baseline:  Goal status: IN PROGRESS 07/03/22- noted increased gait deviation after 450', 07/30/22 - able to amb 600 ft with mild gait deviations deviating from straight path and decreased heel strike bil as well as decreased step length L.  09/25/22- unsteadiness with head turns.   6. Patient will be stand to cook meals without increased R knee pain. Baseline:  Goal status: IN PROGRESS 07/30/22 limited to 10-15 min  7.  Patient will report 28/80 on LEFS  to demonstrate improved functional ability. Baseline: 19 / 80 = 23.8 % at eval, 29 / 80 = 36.3 % on 07/30/22 Goal status: MET  8.  Patient will demonstrate at least 19/24 on DGI to decrease risk of falls. Baseline: 17/24, 19/24 on 07/30/22 Goal status: MET   9.  Patient will be able to sleep without waking from knee pain Baseline:  Goal status: IN PROGRESS 08/28/22- still having difficulty sleeping due to knee pain  09/25/22- starting to sleep much better now.   10.  Patient will report 50% improvement in Right shoulder pain to improve QOL.  Baseline:  Goal status: MET 08/14/22- met, no shoulder pain today   11.  Patient will report 10% improvement on [employer].  Baseline: 38.6 / 100 = 38.6 %  Goal status: MET 13.6%   PLAN:  PT FREQUENCY: 2x/week  PT DURATION: 6 weeks  PLANNED INTERVENTIONS: Therapeutic exercises, Therapeutic activity, Neuromuscular re-education, Balance training, Gait training, Patient/Family education, Self Care, Joint mobilization, Aquatic Therapy, Dry Needling, Electrical stimulation, Spinal mobilization, Cryotherapy, Moist heat, Taping, Ultrasound, Ionotophoresis 4mg /ml Dexamethasone, and Manual therapy  PLAN FOR NEXT SESSION: review exercises, shoe lift, add hamstring stretch, 30 day hold.    Jena Gauss, PT, DPT  10/02/2022, 1:20 PM

## 2022-10-08 ENCOUNTER — Encounter: Payer: Self-pay | Admitting: *Deleted

## 2022-10-09 ENCOUNTER — Ambulatory Visit: Payer: 59 | Admitting: Physical Therapy

## 2022-10-09 ENCOUNTER — Encounter: Payer: Self-pay | Admitting: Physical Therapy

## 2022-10-09 DIAGNOSIS — G8929 Other chronic pain: Secondary | ICD-10-CM | POA: Diagnosis not present

## 2022-10-09 DIAGNOSIS — R252 Cramp and spasm: Secondary | ICD-10-CM

## 2022-10-09 DIAGNOSIS — M5459 Other low back pain: Secondary | ICD-10-CM | POA: Diagnosis not present

## 2022-10-09 DIAGNOSIS — M25511 Pain in right shoulder: Secondary | ICD-10-CM | POA: Diagnosis not present

## 2022-10-09 DIAGNOSIS — M25611 Stiffness of right shoulder, not elsewhere classified: Secondary | ICD-10-CM | POA: Diagnosis not present

## 2022-10-09 DIAGNOSIS — M25661 Stiffness of right knee, not elsewhere classified: Secondary | ICD-10-CM | POA: Diagnosis not present

## 2022-10-09 DIAGNOSIS — M25561 Pain in right knee: Secondary | ICD-10-CM | POA: Diagnosis not present

## 2022-10-09 NOTE — Therapy (Signed)
OUTPATIENT PHYSICAL THERAPY TREATMENT   PHYSICAL THERAPY DISCHARGE SUMMARY  Visits from Start of Care: 20  Current functional level related to goals / functional outcomes: Improved R shoulder pain, improved functional ability.  Met all goals except goals related to R knee pain.    Remaining deficits: R knee pain, R knee ROM limitations   Education / Equipment: HEP   Plan: Patient agrees to discharge.  Patient goals were not met. Patient is being discharged due to lack of progress after last progress note.        Patient Name: BABETTA PATERSON MRN: [id number] DOB:Mar 08, 1933, 89 y.o.,, female Today's Date: 10/09/2022  END OF SESSION:  PT End of Session - 10/09/22 1412     Visit Number 20    Number of Visits 22    Date for PT Re-Evaluation 10/09/22    Authorization Type [employer] MCR    Progress Note Due on Visit 20    PT Start Time 1412    PT Stop Time 1448    PT Time Calculation (min) 36 min    Activity Tolerance Patient tolerated treatment well    Behavior During Therapy WFL for tasks assessed/performed                    Past Medical History:  Diagnosis Date   Back pain    Dizziness    Hypertension    Palpitations    Past Surgical History:  Procedure Laterality Date   CATARACT EXTRACTION  2010   right   COLONOSCOPY  2003   LAPAROSCOPIC APPENDECTOMY N/A 09/25/2020   Procedure: APPENDECTOMY LAPAROSCOPIC;  Surgeon: Rodman Pickle, MD;  Location: [facility] OR;  Service: General;  Laterality: N/A;   RETINAL LASER PROCEDURE     Patient Active Problem List   Diagnosis Date Noted   Lumbar radiculopathy 07/25/2022   Soft tissue mass 07/25/2022   Osteopenia of neck of right femur 07/25/2022   Early dry stage nonexudative age-related macular degeneration of both eyes 01/10/2022   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 01/10/2022   Status post surgery 09/25/2020   Essential hypertension 07/12/2017   PAC (premature atrial contraction)  07/12/2017   Mitral regurgitation 07/12/2017   Onychomycosis 08/10/2015   PCP NOTES>>> 04/06/2015   Skin lesion 03/18/2013   Annual physical exam 12/31/2011   DJD (degenerative joint disease) 12/31/2011   Vitamin D deficiency 12/31/2011   Recurrent UTI 10/10/2011   PALPITATIONS, OCCASIONAL 10/14/2007   BACK PAIN 09/16/2007    PCP: Wanda Plump, MD   REFERRING PROVIDER: Wanda Plump, MD   REFERRING DIAG: [phone] (ICD-10-CM) - Right knee pain, unspecified chronicity  THERAPY DIAG:  Other low back pain  Cramp and spasm  Chronic pain of right knee  Rationale for Evaluation and Treatment: Rehabilitation  ONSET DATE: March/April 2023  SUBJECTIVE:   SUBJECTIVE STATEMENT:   "I don't know, I can't tell."  The pain in the back of her knee is the same, although it felt a little bit better after last session.   PERTINENT HISTORY:  LBP  PAIN:  Are you having pain? Yes: NPRS scale: 4/10 Pain location: R post knee   Pain description: pain, swelling    PRECAUTIONS: None  WEIGHT BEARING RESTRICTIONS: No  FALLS:  Has patient fallen in last 6 months? No  LIVING ENVIRONMENT: Lives with: lives alone Lives in: House/apartment Stairs: No Has following equipment at home: Single point cane  OCCUPATION: retired  PLOF: Independent  PATIENT  GOALS: Get rid of her knee pain and know what is wrong.  NEXT MD VISIT: 10/29/22   OBJECTIVE:   DIAGNOSTIC FINDINGS:  [place] at Dr. Penni Bombard - negative per pt  2022: AP, lateral views of right knee reviewed.  Moderate degenerative changes  with joint space narrowing and subchondral sclerosis noted with marginal  osteophytes in the medial compartment primarily with some findings in the  lateral compartment as well.  No significant patellofemoral arthritis  noted.  No fracture or dislocation.   PATIENT SURVEYS:  LEFS 19 / 80 = 23.8 %  COGNITION: Overall cognitive status: Within functional limits for tasks  assessed     SENSATION: WFL  MUSCLE LENGTH: HS: mild tightness Quads: mild tightness ITB: tight R Piriformis: mild R Hip Flexors: mild R Heelcords: mild R   POSTURE: No Significant postural limitations  PALPATION: Palpation: Marked TTP at R gastroc, HS, quads, ITB, R gluteals. Increased tone in post knee. Patellar Mobility: WNL bil  LOWER EXTREMITY ROM:  A/P ROM Right eval Left eval Right  AROM 07/03/2022 Right  AROM 2/52024 Right 10/09/22  Hip flexion [facility] WFL     Knee flexion 103/116* 125 105 106 114  Knee extension -5/-2 full -5 -2 -5   (Blank rows = not tested) * pain  LOWER EXTREMITY MMT:  MMT Right eval Left eval  Hip flexion 4   Hip extension 5   Hip abduction 5   Hip adduction    Hip internal rotation    Hip external rotation    Knee flexion 5 mild pain   Knee extension 5   Ankle dorsiflexion 5   Ankle plantarflexion    Ankle inversion    Ankle eversion     (Blank rows = not tested)   FUNCTIONAL TESTS:  Difficulty with sit to stand transfer due to knee pain  GAIT: Distance walked:  40 Assistive device utilized: Single point cane Level of assistance: Modified independence Comments: antalgic, uses cane in R UE; cane adjusted to correct height  06/05/22 Low back assessed:  Full functional ROM, pain with UPA and CPA mobs to bil L3/4, L4/5, L5/S1 R >L - Pain decreases with repeated mobs. Marked tightness in bil gluteals, piriformis and lumbar paraspinals  R>L. Patient limited with prone press ups due to arm weakness.   07/24/2022 SHOULDER ASSESSMENT Posture: forward head, rounded shoulders  CERVICAL ROM:  AROM 07/24/2022 08/28/2022  Cervical Flexion 30 35  Cervical Extension 10 35  Cervical Rotation to Left 45 45  Cervical Rotation to Right 45 45   UPPER EXTREMITY MMT:  MMT Right 07/24/2022 Left 07/24/2022  Shoulder flexion 4 5  Shoulder extension    Shoulder abduction 4 5  Shoulder internal rotation 5 5  Shoulder external rotation 5 5   Elbow flexion 5 5  Elbow extension 5 5  Wrist flexion 5 5  Wrist extension 5 5  Grip strength good good   (Blank rows = not tested)  UPPER EXTREMITY ROM:  Active ROM Right eval Left eval  Shoulder flexion 90 (P 150)  130  Shoulder extension 40 62  Shoulder abduction 90 145  Shoulder internal rotation Functional low back ; 50  Functional to upper back T6; 80  Shoulder external rotation 70 85   (Blank rows = not tested) Right hand dominant  Palpation: tenderness/tightness R upper trap, R levator scapulae, R cervical paraspinals, R supraspinatus  DGI: 19/24 on 07/30/22 Gait x 600 feet  - gait speed of 2.7 ft/sec on 07/30/22  Stairs - reciprocal gait ascending with required one UE support; descent step to gait and one UE due to pain in R knee.  TODAY'S TREATMENT:                                                                                                                              DATE:   10/09/22 Therapeutic Exercise: to improve strength and mobility.  Demo, verbal and tactile cues throughout for technique. Nustep L5 x 5 min Therapeutic Activity:  review of progress and goals.  Manual Therapy: to decrease muscle spasm and pain and improve mobility IASTM with edge tool to R medial knee, STM/TPR to vastus medialis, pes anserine.   10/02/22 Therapeutic Exercise: to improve strength and mobility.  Demo, verbal and tactile cues throughout for technique. Nustep L5 x 5 min  L/S stretch, UT stretch 2 x 15 sec each bil  Chin tucks x 10 Manual Therapy: to decrease muscle spasm and pain and improve mobility STM/TPR to R gastroc, hamstrings, UT, L/s, cervical paraspinals skilled palpation and monitoring during dry needling. Trigger Point Dry-Needling  Treatment instructions: Expect mild to moderate muscle soreness. S/S of pneumothorax if dry needled over a lung field, and to seek immediate medical attention should they occur. Patient verbalized understanding of these instructions and  education. Patient Consent Given: Yes Education handout provided: Previously provided Muscles treated: R bil hamstrings, gastroc heads, R UT.  Electrical stimulation performed: No Parameters: N/A Treatment response/outcome: Twitch Response Elicited and Palpable Increase in Muscle Length Self Care:shoe lift in L shoe, demo on how to adjust.   09/25/22 Therapeutic Activity:  to improve safety and mobility Nustep L5 x 5 min Gait in hallway with head turns Corner balance exercises - eyes open with head turns x 10, head nods x 10, repeated with eyes closed Self Care: Education on cramp prevention, recommendation for compression socks.    PATIENT EDUCATION:  Education details: HEP Review Person educated: Patient Education method: Medical illustrator Education comprehension: verbalized understanding and returned demonstration  HOME EXERCISE PROGRAM: Access Code: [email or web address] URL: [email or web address] Date: 08/14/2022 Prepared by: Harrie Foreman  Exercises Added   - Lower Trunk Rotations  - 1 x daily - 7 x weekly - 1 sets - 10 reps - Supine Posterior Pelvic Tilt  - 1 x daily - 7 x weekly - 1 sets - 10 reps - Supine Piriformis Stretch with Foot on Ground  - 1 x daily - 7 x weekly - 1 sets - 3 reps - 30 sec  hold - Supine Sciatic Nerve Glide  - 1 x daily - 7 x weekly - 1 sets - 10 reps   ASSESSMENT:  CLINICAL IMPRESSION:  Tyniya E Goll reports no improvement at all in R knee pain, also reporting increased pain in R medial knee now.  She had taken out insert on R and placed in L shoe based on Youtube advice, explained that it was placed in the R  shoe because she now has a functional leg length discrepancy due to inability to straighten R knee, and placing heel lift on L with aggravate it and could contribute to new knee pain, recommended removing entirely if did not want to place back in R shoe.  Reported decreased pain following manual therapy to R knee.   Since she has not made further progress with R knee pain, recommended referral to orthopedist.       OBJECTIVE IMPAIRMENTS: Abnormal gait, decreased balance, decreased ROM, decreased strength, increased fascial restrictions, increased muscle spasms, impaired flexibility, obesity, and pain.   ACTIVITY LIMITATIONS: standing, transfers, and locomotion level  PARTICIPATION LIMITATIONS: meal prep and community activity  PERSONAL FACTORS: Time since onset of injury/illness/exacerbation and 1 comorbidity: LBP  are also affecting patient's functional outcome.   REHAB POTENTIAL: Excellent  CLINICAL DECISION MAKING: Stable/uncomplicated  EVALUATION COMPLEXITY: Low   GOALS: Goals reviewed with patient? Yes  SHORT TERM GOALS: Target date: 06/06/2022   Patient will be independent with initial HEP. Baseline:  Goal status: MET  2.  Balance assesment completed Baseline:  Goal status: MET  LONG TERM GOALS: Target date: 07/04/2022 extended to 08/28/2022, 10/09/22  Patient will be independent with advanced/ongoing HEP to improve outcomes and carryover.  Baseline:  Goal status: MET  10/09/22  2.  Patient will report at least 75% improvement in R knee pain to improve QOL. Baseline:  Goal status: NOT MET- 10/09/22- no improvement.   3.  Patient will demonstrate improved R knee AROM to >/= 2--120 deg to allow for normal gait and stair mechanics. Baseline:  Goal status: NOT MET 24/16/24 5-112   4.  Patient will demonstrate improved functional LE strength as demonstrated by being able to complete 5xSTS in <=14.8 sec. Baseline:  Goal status: MET 07/03/2022- 12.4 seconds, with increased knee pain.   5.  Patient will be able to ambulate 600' with LRAD and normal gait pattern without increased pain to access community.  Baseline:  Goal status: MET   6. Patient will be stand to cook meals without increased R knee pain. Baseline:  Goal status: IN PROGRESS 07/30/22 limited to 10-15 min  7.  Patient  will report 28/80 on LEFS  to demonstrate improved functional ability. Baseline: 19 / 80 = 23.8 % at eval, 29 / 80 = 36.3 % on 07/30/22 Goal status: MET  8.  Patient will demonstrate at least 19/24 on DGI to decrease risk of falls. Baseline: 17/24, 19/24 on 07/30/22 Goal status: MET   9.  Patient will be able to sleep without waking from knee pain Baseline:  Goal status: NOT MET  - reports having cramps in legs at night frequently  10.  Patient will report 50% improvement in Right shoulder pain to improve QOL.  Baseline:  Goal status: MET 08/14/22- met, no shoulder pain today   11.  Patient will report 10% improvement on [employer].  Baseline: 38.6 / 100 = 38.6 %  Goal status: MET 13.6%   PLAN:  PT FREQUENCY: 2x/week  PT DURATION: 6 weeks  PLANNED INTERVENTIONS: Therapeutic exercises, Therapeutic activity, Neuromuscular re-education, Balance training, Gait training, Patient/Family education, Self Care, Joint mobilization, Aquatic Therapy, Dry Needling, Electrical stimulation, Spinal mobilization, Cryotherapy, Moist heat, Taping, Ultrasound, Ionotophoresis 4mg /ml Dexamethasone, and Manual therapy  PLAN FOR NEXT SESSION: discharge.    Jena Gauss, PT, DPT  10/09/2022, 6:15 PM

## 2022-10-25 ENCOUNTER — Encounter: Payer: Self-pay | Admitting: Family Medicine

## 2022-10-25 ENCOUNTER — Ambulatory Visit (INDEPENDENT_AMBULATORY_CARE_PROVIDER_SITE_OTHER): Payer: 59 | Admitting: Family Medicine

## 2022-10-25 VITALS — BP 130/80 | Ht 61.0 in | Wt 175.0 lb

## 2022-10-25 DIAGNOSIS — G629 Polyneuropathy, unspecified: Secondary | ICD-10-CM | POA: Insufficient documentation

## 2022-10-25 DIAGNOSIS — M5416 Radiculopathy, lumbar region: Secondary | ICD-10-CM

## 2022-10-25 DIAGNOSIS — G6289 Other specified polyneuropathies: Secondary | ICD-10-CM

## 2022-10-25 NOTE — Assessment & Plan Note (Signed)
Reports no improvement with recent epidural.

## 2022-10-25 NOTE — Assessment & Plan Note (Signed)
Continues to has pain in the right lower leg that would be more consistent with a pattern suggestive of the nerve as opposed to it being associated with the knee joint.  She has tried knee joint injections with no improvement. -Counseled on home exercise therapy and supportive care. -Pursue nerve study.

## 2022-10-25 NOTE — Patient Instructions (Signed)
Good to see you Please use heat as needed We will send an order for the nerve study   Please send me a message in MyChart with any questions or updates.  We will call to setup a visit once the nerve stud is completed .   --Dr. Jordan Likes

## 2022-10-25 NOTE — Progress Notes (Signed)
  Marie Hebert - 89 y.o. female MRN [id number]  Date of birth: Apr 28, 1933  SUBJECTIVE:  Including CC & ROS.  No chief complaint on file.   Marie Hebert is a 89 y.o. female that is following up for her right leg pain.  She continues to have pain on the lateral aspect of the knee that radiates down the lateral aspect of the leg into the heel.  She has tried intra-articular steroid injections into the knee with no improvement.  She has tried a recent epidural and reports no improvement.  Pain is intermittent in nature.    Review of Systems See HPI   HISTORY: Past Medical, Surgical, Social, and Family History Reviewed & Updated per EMR.   Pertinent Historical Findings include:  Past Medical History:  Diagnosis Date   Back pain    Dizziness    Hypertension    Palpitations     Past Surgical History:  Procedure Laterality Date   CATARACT EXTRACTION  2010   right   COLONOSCOPY  2003   LAPAROSCOPIC APPENDECTOMY N/A 09/25/2020   Procedure: APPENDECTOMY LAPAROSCOPIC;  Surgeon: Kinsinger, De Blanch, MD;  Location: [facility] OR;  Service: General;  Laterality: N/A;   RETINAL LASER PROCEDURE       PHYSICAL EXAM:  VS: BP 130/80 (BP Location: Left Arm, Patient Position: Sitting)   Ht 5\' 1"  (1.549 m)   Wt 175 lb (79.4 kg)   BMI 33.07 kg/m  Physical Exam Gen: NAD, alert, cooperative with exam, well-appearing MSK:  Neurovascularly intact       ASSESSMENT & PLAN:   Lumbar radiculopathy Reports no improvement with recent epidural.  Peripheral neuropathy Continues to has pain in the right lower leg that would be more consistent with a pattern suggestive of the nerve as opposed to it being associated with the knee joint.  She has tried knee joint injections with no improvement. -Counseled on home exercise therapy and supportive care. -Pursue nerve study.

## 2022-10-26 NOTE — Addendum Note (Signed)
Addended by: Merrilyn Puma on: 10/26/2022 09:27 AM   Modules accepted: Orders

## 2022-10-29 ENCOUNTER — Encounter: Payer: Self-pay | Admitting: Internal Medicine

## 2022-10-29 ENCOUNTER — Ambulatory Visit (INDEPENDENT_AMBULATORY_CARE_PROVIDER_SITE_OTHER): Payer: 59 | Admitting: Internal Medicine

## 2022-10-29 VITALS — BP 132/70 | HR 79 | Temp 97.6°F | Resp 18 | Ht 61.0 in | Wt 181.2 lb

## 2022-10-29 DIAGNOSIS — I10 Essential (primary) hypertension: Secondary | ICD-10-CM | POA: Diagnosis not present

## 2022-10-29 DIAGNOSIS — E785 Hyperlipidemia, unspecified: Secondary | ICD-10-CM

## 2022-10-29 DIAGNOSIS — M1711 Unilateral primary osteoarthritis, right knee: Secondary | ICD-10-CM

## 2022-10-29 NOTE — Progress Notes (Signed)
   Subjective:    Patient ID: Marie Hebert, female    DOB: Oct 06, 1932, 89 y.o.   MRN: [id number]  DOS:  10/29/2022 Type of visit - description: Routine visit  Her main concerns continue to be pain. Has R >>> L knee pain Also has low back pain with radiation to the right leg. Denies actual numbness or tingling at the feet. Has seen sports medicine few times, notes and procedures reviewed.   Review of Systems See above   Past Medical History:  Diagnosis Date   Back pain    Dizziness    Hypertension    Palpitations     Past Surgical History:  Procedure Laterality Date   CATARACT EXTRACTION  2010   right   COLONOSCOPY  2003   LAPAROSCOPIC APPENDECTOMY N/A 09/25/2020   Procedure: APPENDECTOMY LAPAROSCOPIC;  Surgeon: Rodman Pickle, MD;  Location: [facility] OR;  Service: General;  Laterality: N/A;   RETINAL LASER PROCEDURE      Current Outpatient Medications  Medication Instructions   ascorbic acid (VITAMIN C) 500 mg, Daily   Cholecalciferol (VITAMIN D) 125 MCG (5000 UT) CAPS 1 capsule, Oral, Daily   cyclobenzaprine (FLEXERIL) 10 mg, Oral, At bedtime PRN   Magnesium 500 MG CAPS 1 capsule, Oral, Daily,     Multiple Vitamins-Minerals (ONE-A-DAY WOMENS 50+ PO) 1 tablet, Oral, Daily   Multiple Vitamins-Minerals (PRESERVISION AREDS 2 PO) Oral   Omega-3 Fatty Acids (FISH OIL) 1000 MG CAPS 1 capsule, Oral, Daily,         Objective:   Physical Exam BP 132/70   Pulse 79   Temp 97.6 F (36.4 C) (Oral)   Resp 18   Ht 5\' 1"  (1.549 m)   Wt 181 lb 4 oz (82.2 kg)   SpO2 99%   BMI 34.25 kg/m  General:   Well developed, NAD, BMI noted. HEENT:  Normocephalic . Face symmetric, atraumatic Lungs:  CTA B Normal respiratory effort, no intercostal retractions, no accessory muscle use. Heart: RRR,  no murmur.  Lower extremities: Changes consistent with DJD in both knees.  Small effusion on the right?  No warmness, no redness. Skin: Not pale. Not jaundice Neurologic:  alert &  oriented X3.  Speech normal, gait somewhat remitted by knee pain Psych--  Cognition and judgment appear intact.  Cooperative with normal attention span and concentration.  Behavior appropriate. No anxious or depressed appearing.      Assessment    Assessment  HTN (h/o palpitations, on cardiazem) High cholesterol DJD Recurrent UTIs; extensive urology w/u  2015:atrophic vaginitis, eventually decided to try hormonal creams Back pain, chronic, needs a disability parking  Palpitations: Saw cardiology 05-2017: Holter, echo, stress echo essentially negative Osteopenia: T score -1.5 (02/2016) Macular degeneration  PLAN: Lumbar radiculopathy: Has seen sports medicine for RIGHT L5 radiculopathy, had MRI of the lumbar spine : 5-S1 nerve impingement of the L5 nerve root. S/p  a ESI 3/22/2024per sports medicine, no help. They ordered EMG to rule out neuropathy, patient declines any lower extremity paresthesias  tough  I offered  a referral to Ortho, she is unsure. Knee pain: Chronic issue, again I offered a referral to Ortho, declined HTN: Not on any medication, BP is normal. Dyslipidemia: Diet controlled, check FLP RTC 3 months CPX

## 2022-10-29 NOTE — Assessment & Plan Note (Signed)
Lumbar radiculopathy: Has seen sports medicine for RIGHT L5 radiculopathy, had MRI of the lumbar spine : 5-S1 nerve impingement of the L5 nerve root. S/p  a ESI 3/22/2024per sports medicine, no help. They ordered EMG to rule out neuropathy, patient declines any lower extremity paresthesias  tough  I offered  a referral to Ortho, she is unsure. Knee pain: Chronic issue, again I offered a referral to Ortho, declined HTN: Not on any medication, BP is normal. Dyslipidemia: Diet controlled, check FLP RTC 3 months CPX

## 2022-10-29 NOTE — Patient Instructions (Addendum)
Vaccines I recommend: Tdap (tetanus) RSV vaccine Prevnar 20 Shingrix (shingles)   You can do your blood work tomorrow at the third floor.   Come back in 3 months for your physical exam

## 2022-10-30 ENCOUNTER — Other Ambulatory Visit (INDEPENDENT_AMBULATORY_CARE_PROVIDER_SITE_OTHER): Payer: 59

## 2022-10-30 DIAGNOSIS — I10 Essential (primary) hypertension: Secondary | ICD-10-CM | POA: Diagnosis not present

## 2022-10-30 LAB — LIPID PANEL
Cholesterol: 247 mg/dL — ABNORMAL HIGH (ref 0–200)
HDL: 75.4 mg/dL (ref >39.00)
LDL Cholesterol: 144 mg/dL — ABNORMAL HIGH (ref 0–99)
NonHDL: 171.49
Total CHOL/HDL Ratio: 3
Triglycerides: 136 mg/dL (ref 0.0–149.0)
VLDL: 27.2 mg/dL (ref 0.0–40.0)

## 2022-10-30 NOTE — Addendum Note (Signed)
Addended by: Rosita Kea on: 10/30/2022 10:05 AM   Modules accepted: Orders

## 2022-12-11 ENCOUNTER — Telehealth: Payer: Self-pay

## 2022-12-11 NOTE — Patient Outreach (Signed)
  Care Coordination   Initial Visit Note   12/11/2022 Name: Marie Hebert MRN: [id number] DOB: 1933/02/27  Marie Hebert is a 89 y.o. year old female who sees Wanda Plump, MD for primary care. I spoke with  Marie Hebert by phone today.  What matters to the patients health and wellness today?  Marie Hebert reports this is not a good time and request to schedule telephone call after July 4th.    Goals Addressed             This Visit's Progress    COMPLETED: Care Coordination Activities       Interventions Today    Flowsheet Row Most Recent Value  General Interventions   General Interventions Discussed/Reviewed General Interventions Discussed  [brief discussion of care coordination progrm. telephone visit scheduled.]            SDOH assessments and interventions completed:  No    Care Coordination Interventions:  Yes, provided   Follow up plan: Follow up call scheduled for 12/31/22    Encounter Outcome:  Pt. Visit Completed   Kathyrn Sheriff, RN, MSN, BSN, CCM [facility] Care Coordinator [phone]

## 2022-12-26 DIAGNOSIS — M79604 Pain in right leg: Secondary | ICD-10-CM | POA: Diagnosis not present

## 2022-12-31 ENCOUNTER — Ambulatory Visit: Payer: Self-pay

## 2022-12-31 NOTE — Patient Instructions (Addendum)
Visit Information  Thank you for taking time to visit with me today. Please don't hesitate to contact me if I can be of assistance to you.   Following are the goals we discussed today:  Continue to take medications as prescribed Continue to attend provider visits as scheduled Contact your provider with health questions or concerns as needed Maintain fall prevention strategies discussed. See education below.   Our next appointment is by telephone on 02/14/23 at 2:00 pm  Please call the care guide team at [phone] if you need to cancel or reschedule your appointment.   If you are experiencing a Mental Health or Behavioral Health Crisis or need someone to talk to, please call the Suicide and Crisis Lifeline: 40  Kathyrn Sheriff, RN, MSN, BSN, CCM [facility] Care Coordinator [phone]    Fall Prevention in the Home, Adult Falls can cause injuries and can happen to people of all ages. There are many things you can do to make your home safer and to help prevent falls. What actions can I take to prevent falls? General information Use good lighting in all rooms. Make sure to: Replace any light bulbs that burn out. Turn on the lights in dark areas and use night-lights. Keep items that you use often in easy-to-reach places. Lower the shelves around your home if needed. Move furniture so that there are clear paths around it. Do not use throw rugs or other things on the floor that can make you trip. If any of your floors are uneven, fix them. Add color or contrast paint or tape to clearly mark and help you see: Grab bars or handrails. First and last steps of staircases. Where the edge of each step is. If you use a ladder or stepladder: Make sure that it is fully opened. Do not climb a closed ladder. Make sure the sides of the ladder are locked in place. Have someone hold the ladder while you use it. Know where your pets are as you move through your home. What can I do in the bathroom?      Keep the floor dry. Clean up any water on the floor right away. Remove soap buildup in the bathtub or shower. Buildup makes bathtubs and showers slippery. Use non-skid mats or decals on the floor of the bathtub or shower. Attach bath mats securely with double-sided, non-slip rug tape. If you need to sit down in the shower, use a non-slip stool. Install grab bars by the toilet and in the bathtub and shower. Do not use towel bars as grab bars. What can I do in the bedroom? Make sure that you have a light by your bed that is easy to reach. Do not use any sheets or blankets on your bed that hang to the floor. Have a firm chair or bench with side arms that you can use for support when you get dressed. What can I do in the kitchen? Clean up any spills right away. If you need to reach something above you, use a step stool with a grab bar. Keep electrical cords out of the way. Do not use floor polish or wax that makes floors slippery. What can I do with my stairs? Do not leave anything on the stairs. Make sure that you have a light switch at the top and the bottom of the stairs. Make sure that there are handrails on both sides of the stairs. Fix handrails that are broken or loose. Install non-slip stair treads on all your stairs  if they do not have carpet. Avoid having throw rugs at the top or bottom of the stairs. Choose a carpet that does not hide the edge of the steps on the stairs. Make sure that the carpet is firmly attached to the stairs. Fix carpet that is loose or worn. What can I do on the outside of my home? Use bright outdoor lighting. Fix the edges of walkways and driveways and fix any cracks. Clear paths of anything that can make you trip, such as tools or rocks. Add color or contrast paint or tape to clearly mark and help you see anything that might make you trip as you walk through a door, such as a raised step or threshold. Trim any bushes or trees on paths to your home. Check  to see if handrails are loose or broken and that both sides of all steps have handrails. Install guardrails along the edges of any raised decks and porches. Have leaves, snow, or ice cleared regularly. Use sand, salt, or ice melter on paths if you live where there is ice and snow during the winter. Clean up any spills in your garage right away. This includes grease or oil spills. What other actions can I take? Review your medicines with your doctor. Some medicines can cause dizziness or changes in blood pressure, which increase your risk of falling. Wear shoes that: Have a low heel. Do not wear high heels. Have rubber bottoms and are closed at the toe. Feel good on your feet and fit well. Use tools that help you move around if needed. These include: Canes. Walkers. Scooters. Crutches. Ask your doctor what else you can do to help prevent falls. This may include seeing a physical therapist to learn to do exercises to move better and get stronger. Where to find more information Centers for Disease Control and Prevention, STEADI: TonerPromos.no [employer] on Aging: BaseRingTones.pl National Institute on Aging: BaseRingTones.pl Contact a doctor if: You are afraid of falling at home. You feel weak, drowsy, or dizzy at home. You fall at home. Get help right away if you: Lose consciousness or have trouble moving after a fall. Have a fall that causes a head injury. These symptoms may be an emergency. Get help right away. Call 911. Do not wait to see if the symptoms will go away. Do not drive yourself to the hospital. This information is not intended to replace advice given to you by your health care provider. Make sure you discuss any questions you have with your health care provider. Document Revised: 02/12/2022 Document Reviewed: 02/12/2022 Elsevier Patient Education  2024 [employer].

## 2022-12-31 NOTE — Patient Outreach (Signed)
  Care Coordination   Initial Visit Note   12/31/2022 Name: Marie Hebert MRN: [id number] DOB: 02-17-1933  Marie Hebert is a 89 y.o. year old female who sees Marie Plump, MD for primary care. I spoke with  Marie Hebert by phone today.  What matters to the patients health and wellness today?  Marie Hebert reports she only needs someone to come into the home to assist with vacuuming and cleaning the kitchen. She reports she has a contact for a person to come clean, but would also like resources from [employer] care guide. She reports chronic issue with knee pain and states she has had physical therapy, been to many doctors, had nerve test and still unclear as to cause. She reports an appointment with podiatrist, stating foot problem could be causing alignment problem of the knee.  Goals Addressed             This Visit's Progress    Care Coordination Activities       Interventions Today    Flowsheet Row Most Recent Value  Chronic Disease   Chronic disease during today's visit Hypertension (HTN), Other  [chronic knee pain]  General Interventions   General Interventions Discussed/Reviewed General Interventions Discussed, Doctor Visits  Doctor Visits Discussed/Reviewed Doctor Visits Discussed, PCP  PCP/Specialist Visits Compliance with follow-up visit  [reviewed upcoming appointments]  Education Interventions   Education Provided Provided Education  Provided Verbal Education On Medication, When to see the doctor, Other, Community Resources  [advsised to take medications as prescribed, attend provider visits as recommended, referral to care guide regarding in home aid service resources]  Safety Interventions   Safety Discussed/Reviewed Safety Discussed  [discussed fall prevention strategies: change prositions slowly, use assistive device as recommended,  good lighting,, importance of maintaining good muscle strength/tone, clear walkways, regular vision checks]             SDOH assessments and interventions completed:  Yes  SDOH Interventions Today    Flowsheet Row Most Recent Value  SDOH Interventions   Food Insecurity Interventions Intervention Not Indicated  Housing Interventions Intervention Not Indicated  Transportation Interventions Intervention Not Indicated  Utilities Interventions Intervention Not Indicated     Care Coordination Interventions:  Yes, provided   Follow up plan: Follow up call scheduled for 02/14/23    Encounter Outcome:  Pt. Visit Completed   Marie Sheriff, RN, MSN, BSN, CCM [facility] Care Coordinator [phone]

## 2023-01-23 ENCOUNTER — Encounter (INDEPENDENT_AMBULATORY_CARE_PROVIDER_SITE_OTHER): Payer: Self-pay

## 2023-01-23 DIAGNOSIS — M76821 Posterior tibial tendinitis, right leg: Secondary | ICD-10-CM | POA: Diagnosis not present

## 2023-01-23 DIAGNOSIS — M76822 Posterior tibial tendinitis, left leg: Secondary | ICD-10-CM | POA: Diagnosis not present

## 2023-01-23 DIAGNOSIS — M79673 Pain in unspecified foot: Secondary | ICD-10-CM | POA: Diagnosis not present

## 2023-01-25 DIAGNOSIS — H524 Presbyopia: Secondary | ICD-10-CM | POA: Diagnosis not present

## 2023-01-25 DIAGNOSIS — H59812 Chorioretinal scars after surgery for detachment, left eye: Secondary | ICD-10-CM | POA: Diagnosis not present

## 2023-01-25 DIAGNOSIS — H52223 Regular astigmatism, bilateral: Secondary | ICD-10-CM | POA: Diagnosis not present

## 2023-01-25 DIAGNOSIS — H5201 Hypermetropia, right eye: Secondary | ICD-10-CM | POA: Diagnosis not present

## 2023-01-25 DIAGNOSIS — H35363 Drusen (degenerative) of macula, bilateral: Secondary | ICD-10-CM | POA: Diagnosis not present

## 2023-01-25 DIAGNOSIS — H353131 Nonexudative age-related macular degeneration, bilateral, early dry stage: Secondary | ICD-10-CM | POA: Diagnosis not present

## 2023-01-30 DIAGNOSIS — M7052 Other bursitis of knee, left knee: Secondary | ICD-10-CM | POA: Diagnosis not present

## 2023-01-30 DIAGNOSIS — M170 Bilateral primary osteoarthritis of knee: Secondary | ICD-10-CM | POA: Diagnosis not present

## 2023-01-30 DIAGNOSIS — M25561 Pain in right knee: Secondary | ICD-10-CM | POA: Diagnosis not present

## 2023-01-30 DIAGNOSIS — M25562 Pain in left knee: Secondary | ICD-10-CM | POA: Diagnosis not present

## 2023-01-30 DIAGNOSIS — M7051 Other bursitis of knee, right knee: Secondary | ICD-10-CM | POA: Diagnosis not present

## 2023-01-30 DIAGNOSIS — G8929 Other chronic pain: Secondary | ICD-10-CM | POA: Diagnosis not present

## 2023-02-12 ENCOUNTER — Encounter: Payer: 59 | Admitting: Internal Medicine

## 2023-02-13 ENCOUNTER — Ambulatory Visit (INDEPENDENT_AMBULATORY_CARE_PROVIDER_SITE_OTHER): Payer: 59 | Admitting: Internal Medicine

## 2023-02-13 ENCOUNTER — Encounter: Payer: Self-pay | Admitting: Internal Medicine

## 2023-02-13 VITALS — BP 128/84 | HR 89 | Temp 98.4°F | Resp 16 | Ht 61.0 in | Wt 177.5 lb

## 2023-02-13 DIAGNOSIS — M159 Polyosteoarthritis, unspecified: Secondary | ICD-10-CM | POA: Diagnosis not present

## 2023-02-13 DIAGNOSIS — I10 Essential (primary) hypertension: Secondary | ICD-10-CM | POA: Diagnosis not present

## 2023-02-13 DIAGNOSIS — E559 Vitamin D deficiency, unspecified: Secondary | ICD-10-CM

## 2023-02-13 DIAGNOSIS — E785 Hyperlipidemia, unspecified: Secondary | ICD-10-CM | POA: Diagnosis not present

## 2023-02-13 DIAGNOSIS — R42 Dizziness and giddiness: Secondary | ICD-10-CM | POA: Diagnosis not present

## 2023-02-13 DIAGNOSIS — Z0001 Encounter for general adult medical examination with abnormal findings: Secondary | ICD-10-CM

## 2023-02-13 DIAGNOSIS — M1990 Unspecified osteoarthritis, unspecified site: Secondary | ICD-10-CM

## 2023-02-13 DIAGNOSIS — Z0000 Encounter for general adult medical examination without abnormal findings: Secondary | ICD-10-CM

## 2023-02-13 NOTE — Patient Instructions (Addendum)
Come back fasting for blood work  Dizziness: Good hydration Get up slowly If you have any symptoms such as major headache, facial weakness, inability to speak, double vision: Go to the ER.  Next visit in 1 year      "Health Care Power of attorney" ,  "Living will" (Advance care planning documents)  If you already have a living will or healthcare power of attorney, is recommended you bring the copy to be scanned in your chart.   The document will be available to all the doctors you see in the system.  Advance care planning is a process that supports adults in  understanding and sharing their preferences regarding future medical care.  The patient's preferences are recorded in documents called Advance Directives and the can be modified at any time while the patient is in full mental capacity.   If you don't have one, please consider create one.      More information at: [email or web address].si

## 2023-02-13 NOTE — Progress Notes (Unsigned)
Subjective:    Patient ID: Marie Hebert, female    DOB: Jul 16, 1932, 89 y.o.   MRN: [id number]  DOS:  02/13/2023 Type of visit - description: CPX  Here for CPX Request a number of labs as suggested by her daughter.  See letter.  For the last 4 weeks, experienced some dizziness when she stands up, sxs last about a minute.  No associated headache, nausea, vomiting.  No spinning.  Denies any stroke symptoms such as diplopia or facial numbness.  Ongoing knee pain.  Labs requested by the patient   Review of Systems  Other than above, a 14 point review of systems is negative      Past Medical History:  Diagnosis Date   Back pain    Dizziness    Hypertension    Palpitations     Past Surgical History:  Procedure Laterality Date   CATARACT EXTRACTION  2010   right   COLONOSCOPY  2003   LAPAROSCOPIC APPENDECTOMY N/A 09/25/2020   Procedure: APPENDECTOMY LAPAROSCOPIC;  Surgeon: Kinsinger, De Blanch, MD;  Location: [facility] OR;  Service: General;  Laterality: N/A;   RETINAL LASER PROCEDURE     Social History   Socioeconomic History   Marital status: Widowed    Spouse name: Not on file   Number of children: 4   Years of education: Not on file   Highest education level: Not on file  Occupational History   Occupation: n/a  Tobacco Use   Smoking status: Never   Smokeless tobacco: Never  Vaping Use   Vaping status: Never Used  Substance and Sexual Activity   Alcohol use: No    Alcohol/week: 0.0 standard drinks of alcohol   Drug use: Not on file   Sexual activity: Not on file  Other Topics Concern   Not on file  Social History Narrative   Widower   Still drives   Lives by herself     Social Determinants of Health   Financial Resource Strain: Not on file  Food Insecurity: No Food Insecurity (12/31/2022)   Hunger Vital Sign    Worried About Running Out of Food in the Last Year: Never true    Ran Out of Food in the Last Year: Never true  Transportation Needs: No  Transportation Needs (12/31/2022)   PRAPARE - Administrator, Civil Service (Medical): No    Lack of Transportation (Non-Medical): No  Physical Activity: Not on file  Stress: Not on file  Social Connections: Unknown (11/05/2022)   Received from [facility], [facility]   Social Network    Social Network: Not on file  Intimate Partner Violence: Unknown (11/05/2022)   Received from [facility], [facility]   HITS    Physically Hurt: Not on file    Insult or Talk Down To: Not on file    Threaten Physical Harm: Not on file    Scream or Curse: Not on file     Current Outpatient Medications  Medication Instructions   ascorbic acid (VITAMIN C) 500 mg, Daily   Cholecalciferol (VITAMIN D) 125 MCG (5000 UT) CAPS 1 capsule, Oral, Daily   Magnesium 500 MG CAPS 1 capsule, Oral, Daily,     Multiple Vitamins-Minerals (ONE-A-DAY WOMENS 50+ PO) 1 tablet, Oral, Daily   Multiple Vitamins-Minerals (PRESERVISION AREDS 2 PO) Oral   Omega-3 Fatty Acids (FISH OIL) 1000 MG CAPS 1 capsule, Oral, Daily,         Objective:   Physical Exam BP 128/84  Pulse 89   Temp 98.4 F (36.9 C) (Oral)   Resp 16   Ht 5\' 1"  (1.549 m)   Wt 177 lb 8 oz (80.5 kg)   SpO2 96%   BMI 33.54 kg/m  General: Well developed, NAD, BMI noted Neck: No  thyromegaly  HEENT:  Normocephalic . Face symmetric, atraumatic Lungs:  CTA B Normal respiratory effort, no intercostal retractions, no accessory muscle use. Heart: RRR,  no murmur.  Abdomen:  Not distended, soft, non-tender. No rebound or rigidity.   Lower extremities: no pretibial edema bilaterally  Skin: Exposed areas without rash. Not pale. Not jaundice Neurologic:  alert & oriented X3.  Speech normal, gait appropriate for age and unassisted.  Transfer appropriate for age. Strength symmetric and appropriate for age.  Psych: Cognition and judgment appear intact.  Cooperative with normal attention span and concentration.  Behavior  appropriate. No anxious or depressed appearing.     Assessment     Assessment  HTN (h/o palpitations, on cardiazem) High cholesterol DJD Recurrent UTIs; extensive urology w/u  2015:atrophic vaginitis, eventually decided to try hormonal creams Back pain, chronic, needs a disability parking  Palpitations: Saw cardiology 05-2017: Holter, echo, stress echo essentially negative Osteopenia: T score -1.5 (02/2016) Macular degeneration  PLAN: Here for CPX Has consistently declined any screenings or vaccines.  She declines again today. Labs:CMP, FLP, CBC.  She also request a number of other labs. Lifestyle remains very good.  Information regards Healthcare POA provided HTN: On lifestyle control, reports normal ambulatory BPs.  BP today looks good, check labs High cholesterol: Diet controlled, check labs. DJD, lumbar radiculopathy: Has seen a number of other doctors without major relief, had a negative nerve conduction study 12/26/2022. Dizziness: As described above, probably peripheral, recommend observation, get up slowly.  Let me know if she has any red flags.  Offered PT referral.   Additional labs.  Request fasting insulin, high sensitive CPR, uric acid, vitamin D.  She is aware that her insurance may not cover these tests but she nevertheless likes them done. RTC 1 year

## 2023-02-14 ENCOUNTER — Encounter: Payer: Self-pay | Admitting: Internal Medicine

## 2023-02-14 ENCOUNTER — Ambulatory Visit: Payer: Self-pay

## 2023-02-14 ENCOUNTER — Other Ambulatory Visit (INDEPENDENT_AMBULATORY_CARE_PROVIDER_SITE_OTHER): Payer: 59

## 2023-02-14 DIAGNOSIS — M1990 Unspecified osteoarthritis, unspecified site: Secondary | ICD-10-CM | POA: Diagnosis not present

## 2023-02-14 DIAGNOSIS — I10 Essential (primary) hypertension: Secondary | ICD-10-CM | POA: Diagnosis not present

## 2023-02-14 DIAGNOSIS — E785 Hyperlipidemia, unspecified: Secondary | ICD-10-CM | POA: Diagnosis not present

## 2023-02-14 DIAGNOSIS — M1711 Unilateral primary osteoarthritis, right knee: Secondary | ICD-10-CM

## 2023-02-14 DIAGNOSIS — E559 Vitamin D deficiency, unspecified: Secondary | ICD-10-CM

## 2023-02-14 LAB — COMPREHENSIVE METABOLIC PANEL
ALT: 13 U/L (ref 0–35)
AST: 18 U/L (ref 0–37)
Albumin: 4 g/dL (ref 3.5–5.2)
Alkaline Phosphatase: 64 U/L (ref 39–117)
BUN: 14 mg/dL (ref 6–23)
CO2: 28 meq/L (ref 19–32)
Calcium: 9.7 mg/dL (ref 8.4–10.5)
Chloride: 103 meq/L (ref 96–112)
Creatinine, Ser: 0.63 mg/dL (ref 0.40–1.20)
GFR: 79.36 mL/min (ref >60.00)
Glucose, Bld: 110 mg/dL — ABNORMAL HIGH (ref 70–99)
Potassium: 4.6 meq/L (ref 3.5–5.1)
Sodium: 139 meq/L (ref 135–145)
Total Bilirubin: 0.7 mg/dL (ref 0.2–1.2)
Total Protein: 6.6 g/dL (ref 6.0–8.3)

## 2023-02-14 LAB — LIPID PANEL
Cholesterol: 278 mg/dL — ABNORMAL HIGH (ref 0–200)
HDL: 77.4 mg/dL (ref >39.00)
LDL Cholesterol: 175 mg/dL — ABNORMAL HIGH (ref 0–99)
NonHDL: 200.69
Total CHOL/HDL Ratio: 4
Triglycerides: 126 mg/dL (ref 0.0–149.0)
VLDL: 25.2 mg/dL (ref 0.0–40.0)

## 2023-02-14 LAB — HIGH SENSITIVITY CRP: CRP, High Sensitivity: 2.56 mg/L (ref 0.000–5.000)

## 2023-02-14 LAB — CBC WITH DIFFERENTIAL/PLATELET
Basophils Absolute: 0 10*3/uL (ref 0.0–0.1)
Basophils Relative: 0.6 % (ref 0.0–3.0)
Eosinophils Absolute: 0.2 10*3/uL (ref 0.0–0.7)
Eosinophils Relative: 4.2 % (ref 0.0–5.0)
HCT: 43.3 % (ref 36.0–46.0)
Hemoglobin: 14 g/dL (ref 12.0–15.0)
Lymphocytes Relative: 40.3 % (ref 12.0–46.0)
Lymphs Abs: 2.3 10*3/uL (ref 0.7–4.0)
MCHC: 32.3 g/dL (ref 30.0–36.0)
MCV: 90.3 fl (ref 78.0–100.0)
Monocytes Absolute: 0.5 10*3/uL (ref 0.1–1.0)
Monocytes Relative: 8.7 % (ref 3.0–12.0)
Neutro Abs: 2.7 10*3/uL (ref 1.4–7.7)
Neutrophils Relative %: 46.2 % (ref 43.0–77.0)
Platelets: 225 10*3/uL (ref 150.0–400.0)
RBC: 4.8 Mil/uL (ref 3.87–5.11)
RDW: 15.1 % (ref 11.5–15.5)
WBC: 5.8 10*3/uL (ref 4.0–10.5)

## 2023-02-14 LAB — HEMOGLOBIN A1C: Hgb A1c MFr Bld: 5.5 % (ref 4.6–6.5)

## 2023-02-14 LAB — URIC ACID: Uric Acid, Serum: 4.4 mg/dL (ref 2.4–7.0)

## 2023-02-14 LAB — GAMMA GT: GGT: 14 U/L (ref 7–51)

## 2023-02-14 NOTE — Patient Instructions (Signed)
Visit Information  Thank you for taking time to visit with me today. Please don't hesitate to contact me if I can be of assistance to you.   Following are the goals we discussed today:  Continue to take medications/vitamins as prescribed. Continue to attend provider visits as scheduled Contact provider with health questions or concerns or any worsening of condition   Our next appointment is by telephone on 03/11/23 at 1:15 pm  Please call the care guide team at [phone] if you need to cancel or reschedule your appointment.   If you are experiencing a Mental Health or Behavioral Health Crisis or need someone to talk to, please call the Suicide and Crisis Lifeline: 988 call the [place] National Suicide Prevention Lifeline: (743)192-9514 or TTY: (564)847-3840 TTY 539 842 2627) to talk to a trained counselor call 1-800-273-TALK (toll free, 24 hour hotline)  Kathyrn Sheriff, RN, MSN, BSN, CCM Care Management Coordinator [phone]

## 2023-02-14 NOTE — Assessment & Plan Note (Signed)
Here for CPX Has consistently declined any screenings or vaccines.  She declines again today. Labs:CMP, FLP, CBC.  She also request a number of other labs. Lifestyle remains very good.  Information regards Healthcare POA provided

## 2023-02-14 NOTE — Assessment & Plan Note (Signed)
Here for CPX HTN: On lifestyle control, reports normal ambulatory BPs.  BP today looks good, check labs High cholesterol: Diet controlled, check labs. DJD, lumbar radiculopathy: Has seen a number of other doctors without major relief, had a negative nerve conduction study 12/26/2022. Dizziness: As described above, probably peripheral, recommend observation, get up slowly.  Let me know if she has any red flags.  Offered PT referral.   Additional labs.  Request fasting insulin, high sensitive CPR, uric acid, vitamin D.  She is aware that her insurance may not cover these tests but she nevertheless likes them done. RTC 1 year

## 2023-02-14 NOTE — Patient Outreach (Signed)
  Care Coordination   Follow Up Visit Note   02/14/2023 Name: Marie Hebert MRN: [id number] DOB: Sep 02, 1932  Marie Hebert is a 89 y.o. year old female who sees Wanda Plump, MD for primary care. I spoke with  Marie Hebert by phone today.  What matters to the patients health and wellness today?  Marie Hebert denies dizziness today. She reports only having issues with knee pain-is being followed by orthopedics. She reports she does not want physical therapy or knee injections offered by therapist at this time. She is without questions or concerns at this time.  Goals Addressed             This Visit's Progress    Care Coordination Activities       Interventions Today    Flowsheet Row Most Recent Value  Chronic Disease   Chronic disease during today's visit Other  General Interventions   General Interventions Discussed/Reviewed General Interventions Reviewed, Level of Care  Level of Care --  [referral to care guide for in home care assistance resources]  Exercise Interventions   Exercise Discussed/Reviewed Exercise Discussed  [discussed physical therapy-patient declines]  Education Interventions   Education Provided Provided Education  Provided Verbal Education On When to see the doctor  [reviewed patient instructions from primary office visit 02/13/23, encouraged to follow up with provider with health questions or concerns]  Safety Interventions   Safety Discussed/Reviewed Fall Risk, Safety Reviewed  [reviewed fall/safety prevention strategies]            SDOH assessments and interventions completed:  No  Care Coordination Interventions:  Yes, provided   Follow up plan: Follow up call scheduled for 03/11/23    Encounter Outcome:  Pt. Visit Completed   Kathyrn Sheriff, RN, MSN, BSN, CCM Care Management Coordinator [phone]

## 2023-02-15 ENCOUNTER — Telehealth: Payer: Self-pay

## 2023-02-15 LAB — INSULIN, RANDOM: Insulin: 9.2 u[IU]/mL

## 2023-02-15 LAB — VITAMIN D 25 HYDROXY (VIT D DEFICIENCY, FRACTURES): VITD: 93.45 ng/mL (ref 30.00–100.00)

## 2023-02-15 NOTE — Telephone Encounter (Signed)
   Telephone encounter was:  Successful.  02/15/2023 Name: TESHA FOLGER MRN: [id number] DOB: 17-Jul-1932  Tyeasha E Fissel is a 89 y.o. year old female who is a primary care patient of Wanda Plump, MD . The community resource team was consulted for assistance with  In home care  Care guide performed the following interventions: Patient provided with information about care guide support team and interviewed to confirm resource needs.Patient doesnt need in home care for herself. PT wants someone to clean her home. I told her I could mail or give resources over ther phone to her but she declined resources to pay out of pocket.There are no free in home cleaning resources at this time .   Follow Up Plan:  No further follow up planned at this time. The patient has been provided with needed resources.    Lenard Forth Gardner  [facility] Guide, Phone: [phone] Website: [email or web address]

## 2023-02-18 NOTE — Addendum Note (Signed)
Addended byConrad [place] D on: 02/18/2023 02:42 PM   Modules accepted: Orders

## 2023-02-19 ENCOUNTER — Telehealth: Payer: Self-pay | Admitting: Internal Medicine

## 2023-02-19 ENCOUNTER — Encounter: Payer: Self-pay | Admitting: Internal Medicine

## 2023-02-19 NOTE — Telephone Encounter (Signed)
Results mailed 

## 2023-02-19 NOTE — Telephone Encounter (Signed)
Pt requesting her lab results to be mailed to her.

## 2023-03-11 ENCOUNTER — Telehealth: Payer: Self-pay | Admitting: Internal Medicine

## 2023-03-11 DIAGNOSIS — M545 Low back pain, unspecified: Secondary | ICD-10-CM

## 2023-03-11 NOTE — Telephone Encounter (Signed)
-   We could fax prescription for physical therapy, she needs to get [place] the name of practice and a fax number. - Please clarify, this is for low back pain correct?

## 2023-03-11 NOTE — Telephone Encounter (Signed)
Please advise 

## 2023-03-11 NOTE — Telephone Encounter (Signed)
Patients daughter called and would like to know can provider send a PT referral but she did state she took mom out of state to [place] for one month. Daughter would like to start PT in [place], please call patient.

## 2023-03-12 DIAGNOSIS — M25561 Pain in right knee: Secondary | ICD-10-CM | POA: Diagnosis not present

## 2023-03-12 DIAGNOSIS — R531 Weakness: Secondary | ICD-10-CM | POA: Diagnosis not present

## 2023-03-12 DIAGNOSIS — M6281 Muscle weakness (generalized): Secondary | ICD-10-CM | POA: Diagnosis not present

## 2023-03-12 DIAGNOSIS — M25669 Stiffness of unspecified knee, not elsewhere classified: Secondary | ICD-10-CM | POA: Diagnosis not present

## 2023-03-12 NOTE — Telephone Encounter (Signed)
PT referral placed.

## 2023-03-12 NOTE — Telephone Encounter (Signed)
LMOM for Pt's daughter Marie Hebert, informing of PCP recommendations .

## 2023-03-12 NOTE — Telephone Encounter (Signed)
Patients daughter called with PT info  [facility]  [place], [place]  Fax # [phone] Atten to [facility]

## 2023-03-13 NOTE — Telephone Encounter (Signed)
Received call from an [place] ENT office- they received this referral yesterday for Pt informing it went to wrong office, they will shred information. I was able to find the correct fax number online- [phone].   Gwen- can you resend to the correct fax number please? [phone]. Thank you.

## 2023-03-14 DIAGNOSIS — M6281 Muscle weakness (generalized): Secondary | ICD-10-CM | POA: Diagnosis not present

## 2023-03-14 DIAGNOSIS — M25669 Stiffness of unspecified knee, not elsewhere classified: Secondary | ICD-10-CM | POA: Diagnosis not present

## 2023-03-14 DIAGNOSIS — M25561 Pain in right knee: Secondary | ICD-10-CM | POA: Diagnosis not present

## 2023-03-14 DIAGNOSIS — R531 Weakness: Secondary | ICD-10-CM | POA: Diagnosis not present

## 2023-03-19 DIAGNOSIS — M25669 Stiffness of unspecified knee, not elsewhere classified: Secondary | ICD-10-CM | POA: Diagnosis not present

## 2023-03-19 DIAGNOSIS — M6281 Muscle weakness (generalized): Secondary | ICD-10-CM | POA: Diagnosis not present

## 2023-03-19 DIAGNOSIS — M25561 Pain in right knee: Secondary | ICD-10-CM | POA: Diagnosis not present

## 2023-03-19 DIAGNOSIS — R531 Weakness: Secondary | ICD-10-CM | POA: Diagnosis not present

## 2023-03-21 DIAGNOSIS — M6281 Muscle weakness (generalized): Secondary | ICD-10-CM | POA: Diagnosis not present

## 2023-03-21 DIAGNOSIS — M25561 Pain in right knee: Secondary | ICD-10-CM | POA: Diagnosis not present

## 2023-03-21 DIAGNOSIS — M25669 Stiffness of unspecified knee, not elsewhere classified: Secondary | ICD-10-CM | POA: Diagnosis not present

## 2023-03-21 DIAGNOSIS — R531 Weakness: Secondary | ICD-10-CM | POA: Diagnosis not present

## 2023-03-22 DIAGNOSIS — R531 Weakness: Secondary | ICD-10-CM | POA: Diagnosis not present

## 2023-03-22 DIAGNOSIS — M25561 Pain in right knee: Secondary | ICD-10-CM | POA: Diagnosis not present

## 2023-03-22 DIAGNOSIS — M6281 Muscle weakness (generalized): Secondary | ICD-10-CM | POA: Diagnosis not present

## 2023-03-22 DIAGNOSIS — M25669 Stiffness of unspecified knee, not elsewhere classified: Secondary | ICD-10-CM | POA: Diagnosis not present

## 2023-03-26 DIAGNOSIS — M25669 Stiffness of unspecified knee, not elsewhere classified: Secondary | ICD-10-CM | POA: Diagnosis not present

## 2023-03-26 DIAGNOSIS — R531 Weakness: Secondary | ICD-10-CM | POA: Diagnosis not present

## 2023-03-26 DIAGNOSIS — M6281 Muscle weakness (generalized): Secondary | ICD-10-CM | POA: Diagnosis not present

## 2023-03-26 DIAGNOSIS — M25561 Pain in right knee: Secondary | ICD-10-CM | POA: Diagnosis not present

## 2023-03-28 DIAGNOSIS — R531 Weakness: Secondary | ICD-10-CM | POA: Diagnosis not present

## 2023-03-28 DIAGNOSIS — M25669 Stiffness of unspecified knee, not elsewhere classified: Secondary | ICD-10-CM | POA: Diagnosis not present

## 2023-03-28 DIAGNOSIS — M25561 Pain in right knee: Secondary | ICD-10-CM | POA: Diagnosis not present

## 2023-03-28 DIAGNOSIS — M6281 Muscle weakness (generalized): Secondary | ICD-10-CM | POA: Diagnosis not present

## 2023-03-29 DIAGNOSIS — M25561 Pain in right knee: Secondary | ICD-10-CM | POA: Diagnosis not present

## 2023-03-29 DIAGNOSIS — M6281 Muscle weakness (generalized): Secondary | ICD-10-CM | POA: Diagnosis not present

## 2023-03-29 DIAGNOSIS — R531 Weakness: Secondary | ICD-10-CM | POA: Diagnosis not present

## 2023-03-29 DIAGNOSIS — M25669 Stiffness of unspecified knee, not elsewhere classified: Secondary | ICD-10-CM | POA: Diagnosis not present

## 2023-04-02 DIAGNOSIS — M6281 Muscle weakness (generalized): Secondary | ICD-10-CM | POA: Diagnosis not present

## 2023-04-02 DIAGNOSIS — M25561 Pain in right knee: Secondary | ICD-10-CM | POA: Diagnosis not present

## 2023-04-02 DIAGNOSIS — R531 Weakness: Secondary | ICD-10-CM | POA: Diagnosis not present

## 2023-04-02 DIAGNOSIS — M25669 Stiffness of unspecified knee, not elsewhere classified: Secondary | ICD-10-CM | POA: Diagnosis not present

## 2023-04-09 ENCOUNTER — Telehealth: Payer: Self-pay | Admitting: Internal Medicine

## 2023-04-09 DIAGNOSIS — M545 Low back pain, unspecified: Secondary | ICD-10-CM

## 2023-04-09 NOTE — Telephone Encounter (Signed)
Pt called and stated that she recently did physical therapy and noticed a major improvement in her body, muscles, and strength. She wanted to request if pcp can refer her to continue physical therapy. She stated pcp is aware of her issues already. Please call and advise pt.

## 2023-04-09 NOTE — Addendum Note (Signed)
Addended by: Conrad [place] D on: 04/09/2023 10:34 AM   Modules accepted: Orders

## 2023-04-09 NOTE — Telephone Encounter (Signed)
Referral placed.

## 2023-04-15 NOTE — Therapy (Signed)
OUTPATIENT PHYSICAL THERAPY THORACOLUMBAR EVALUATION   Patient Name: Marie Hebert MRN: [id number] DOB:16-Oct-1932, 89 y.o., female Today's Date: 04/16/2023  END OF SESSION:  PT End of Session - 04/16/23 1447     Visit Number 1    Date for PT Re-Evaluation 06/25/23    Authorization Type [employer] Medicare + Medicaid    Progress Note Due on Visit 10    PT Start Time 1410    PT Stop Time 1445    PT Time Calculation (min) 35 min    Activity Tolerance Patient tolerated treatment well    Behavior During Therapy WFL for tasks assessed/performed             Past Medical History:  Diagnosis Date   Back pain    Dizziness    Hypertension    Palpitations    Past Surgical History:  Procedure Laterality Date   CATARACT EXTRACTION  2010   right   COLONOSCOPY  2003   LAPAROSCOPIC APPENDECTOMY N/A 09/25/2020   Procedure: APPENDECTOMY LAPAROSCOPIC;  Surgeon: Rodman Pickle, MD;  Location: [facility] OR;  Service: General;  Laterality: N/A;   RETINAL LASER PROCEDURE     Patient Active Problem List   Diagnosis Date Noted   Peripheral neuropathy 10/25/2022   Lumbar radiculopathy 07/25/2022   Soft tissue mass 07/25/2022   Osteopenia of neck of right femur 07/25/2022   Early dry stage nonexudative age-related macular degeneration of both eyes 01/10/2022   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 01/10/2022   Status post surgery 09/25/2020   Essential hypertension 07/12/2017   PAC (premature atrial contraction) 07/12/2017   Mitral regurgitation 07/12/2017   Onychomycosis 08/10/2015   PCP NOTES>>> 04/06/2015   Skin lesion 03/18/2013   Annual physical exam 12/31/2011   DJD (degenerative joint disease) 12/31/2011   Vitamin D deficiency 12/31/2011   Recurrent UTI 10/10/2011   PALPITATIONS, OCCASIONAL 10/14/2007   Backache 09/16/2007    PCP: Wanda Plump, MD   REFERRING PROVIDER: Wanda Plump, MD   REFERRING DIAG: M54.50 (ICD-10-CM) - Low back pain, unspecified  back pain laterality, unspecified chronicity, unspecified whether sciatica present   Rationale for Evaluation and Treatment: Rehabilitation  THERAPY DIAG:  Other low back pain  Cramp and spasm  Chronic pain of right knee  ONSET DATE: ongoing since March 2023  SUBJECTIVE:                                                                                                                                                                                           SUBJECTIVE STATEMENT: She went to [facility] [place] to visit her daughter  and got to participate in PT program, exercising every day with supervision, felt 40% better after, so wanted to continue progress.  She did standing exercises,  then they would do electric and cold on her knee.  She still has pain in her R knee and sciatic nerve.  After she finished with PT here last she went to sport dr., he gave her injection in her knee which made her knee hurt very bad and told her he didn't think it was bad enough to go to [facility].   She thought we would have her records from [place].    PERTINENT HISTORY:  Osteopenia, DJD, chronic LBP, peripheral neuropathy  PAIN:  Are you having pain? Yes: NPRS scale: 5/10 Pain location: R side low back radiating down to R knee posterior Pain description: ache Aggravating factors: sleep Relieving factors: wake up and do exercises  PRECAUTIONS: None  RED FLAGS: None   WEIGHT BEARING RESTRICTIONS: No  FALLS:  Has patient fallen in last 6 months? No  LIVING ENVIRONMENT: Lives with: lives alone Lives in: House/apartment Stairs: No Has following equipment at home: Single point cane  OCCUPATION: retired  PLOF: Independent  PATIENT GOALS:  Get my pain better  NEXT MD VISIT: 05/22/23 with PCP   OBJECTIVE:   DIAGNOSTIC FINDINGS:  03/25/2019 DG Lumbar spine IMPRESSION: Diffuse osteopenia. Diffuse severe multilevel degenerative changes lumbar spine. No acute bony abnormality.  01/30/2023 XR  knees  Arthritis both knees with squaring and subchondral irregularity to both  joint lines.  The right has moderate medial and moderate to significant  lateral narrowing on the left has moderate medial narrowing.  No fracture  or bony lesion or other findings.    PATIENT SURVEYS:  Modified Oswestry 22/50   COGNITION: Overall cognitive status: Within functional limits for tasks assessed     SENSATION: Not tested  MUSCLE LENGTH: NT  POSTURE: rounded shoulders and forward head  PALPATION: Tenderness R pes anserine, posterior knee, R glut medius, piriformis, QL  LUMBAR ROM:   AROM eval  Flexion To knees  Extension WNL  Right lateral flexion To knee  Left lateral flexion To knee  Right rotation WNL  Left rotation WNL   (Blank rows = not tested)  LOWER EXTREMITY ROM:  R knee flexion 103 deg. , lacking 5 deg extension, L knee flexion 120, lacking 3 deg extension.    LOWER EXTREMITY MMT:    MMT Right eval Left eval  Hip flexion 4p! 4  Hip extension    Hip abduction 5 5  Hip adduction 5 5  Knee flexion 4+p! 5  Knee extension 5 5  Ankle dorsiflexion 5 5  Ankle plantarflexion 5 5   (Blank rows = not tested)  LUMBAR SPECIAL TESTS:  NT  FUNCTIONAL TESTS:  5 times sit to stand: 11.3 seconds without UE assist   GAIT: Distance walked: 37' Assistive device utilized: None Level of assistance: Complete Independence Comments: visually slow  TODAY'S TREATMENT:  DATE:   04/16/23 EVAL only     PATIENT EDUCATION:  Education details: findings, POC Person educated: Patient Education method: Explanation Education comprehension: verbalized understanding  HOME EXERCISE PROGRAM: TBD  ASSESSMENT:  CLINICAL IMPRESSION: Marie Hebert  is a 89 y.o. female who was seen today for physical therapy evaluation and treatment for R sided low back  pain with sciatica and R knee pain.    She has had previous PT, shots in her knee and back, all with only temporary relief, MRI of the lumbar spine showing L5-S1 nerve impingement of the L5 nerve root, imaging of knee demonstrates R knee OA, pain in R knee appears to be due to both back and knee.  She does report 40% improvement from most recent round of PT in [place] and would like to continue to build on progress, with more emphasis on exercise.  She demonstrates decreased RLE strength, R knee ROM, and pain with palpation in R glutes/piriformis, although no pain with lumbar AROM.  Rorie E Meriwether would benefit from skilled physical therapy to improve strength, activity tolerance and decrease pain.  We did discuss today community exercise programs to continue progress after PT, specifically [facility], but she was not interested in driving to [place] town [employer].  Aquatic therapy may be a good fit, but again travel distance is a barrier for her.      OBJECTIVE IMPAIRMENTS: decreased activity tolerance, decreased endurance, decreased mobility, difficulty walking, decreased ROM, decreased strength, hypomobility, increased fascial restrictions, impaired perceived functional ability, increased muscle spasms, and pain.   ACTIVITY LIMITATIONS: carrying, lifting, bending, standing, sleeping, stairs, and locomotion level  PARTICIPATION LIMITATIONS: meal prep, cleaning, laundry, shopping, and community activity  PERSONAL FACTORS: Age, Past/current experiences, Social background, Time since onset of injury/illness/exacerbation, and 1-2 comorbidities: Osteopenia, DJD, chronic LBP, peripheral neuropathy  are also affecting patient's functional outcome.   REHAB POTENTIAL: Good  CLINICAL DECISION MAKING: Evolving/moderate complexity  EVALUATION COMPLEXITY: Low   GOALS: Goals reviewed with patient? Yes  SHORT TERM GOALS: Target date: 04/30/2023   Patient will be independent with initial  HEP.  Baseline:  Goal status: INITIAL   LONG TERM GOALS: Target date: 06/25/2023   Patient will be independent with advanced/ongoing HEP to improve outcomes and carryover.  Baseline:  Goal status: INITIAL  2.  Patient will report 75% improvement in low back pain/sciatica to improve QOL.  Baseline:  Goal status: INITIAL  3.  Patient will report 75% improvement in R knee pain.   Baseline:  Goal status: INITIAL  4.  Patient will demonstrate improved functional strength as demonstrated by 4+/5 R hip flexion strength without pain. Baseline: 4/5 with pain Goal status: INITIAL  5.  Patient will report at least 6 points improvement on modified Oswestry to demonstrate improved functional ability.  Baseline: 22/50 Goal status: INITIAL   PLAN:  PT FREQUENCY: 1-2x/week  PT DURATION: 10 weeks  PLANNED INTERVENTIONS: 97110-Therapeutic exercises, 97530- Therapeutic activity, [id number]- Neuromuscular re-education, 97535- Self Care, 52841- Manual therapy, [phone]- Gait training, [phone]- Orthotic Fit/training, [phone]- Aquatic Therapy, [phone]- Electrical stimulation (unattended), [id number]- Electrical stimulation (manual), [id number]- Ultrasound, [id number]- Traction (mechanical), [id number]- Ionotophoresis 4mg /ml Dexamethasone, Patient/Family education, Balance training, Stair training, Taping, Dry Needling, Joint mobilization, Joint manipulation, Spinal manipulation, Spinal mobilization, Cryotherapy, and Moist heat.  PLAN FOR NEXT SESSION: review standing exercises, knee strengthening - isometric holds, resistance equipment, modalities PRN - liked estim and cold on knee.  TrDN to glutes/piriformis?   Jena Gauss, PT, DPT  04/16/2023, 4:04 PM   Date of referral: 04/09/2023 Referring provider: Wanda Plump, MD Referring diagnosis? M54.50 (ICD-10-CM) - Low back pain, unspecified  Treatment diagnosis? (if different than referring diagnosis) M54.59 other low back pain, M25.561 Chronic R knee pain, M62.81 Muscle  weakness,  R25.2 Cramp and spasm,   What was this (referring dx) caused by? Ongoing Issue  Ashby Dawes of Condition: Chronic (continuous duration > 3 months)   Laterality: Rt  Current Functional Measure Score: Back Index 22/50 = 44%  Objective measurements identify impairments when they are compared to normal values, the uninvolved extremity, and prior level of function.  [x]  Yes  []  No  Objective assessment of functional ability: Moderate functional limitations   Briefly describe symptoms: chronic R sided sciatica and R knee pain, interfering with sleep especially (1/2 usual amount), but also limiting walking  How did symptoms start: March 2023  Average pain intensity:  Last 24 hours: 5/10  Past week: 5/10  How often does the pt experience symptoms? Constantly  How much have the symptoms interfered with usual daily activities? Moderately  How has condition changed since care began at this facility? NA - initial visit  In general, how is the patients overall health? Good   BACK PAIN (STarT Back Screening Tool) Has pain spread down the leg(s) at some time in the last 2 weeks? yes Has there been pain in the shoulder or neck at some time in the last 2 weeks? no Has the pt only walked short distances because of back pain? yes Has patient dressed more slowly because of back pain in the past 2 weeks? no Does patient think it's not safe for a person with this condition to be physically active? no Does patient have worrying thoughts a lot of the time? no Does patient feel back pain is terrible and will never get any better? no Has patient stopped enjoying things they usually enjoy? no

## 2023-04-16 ENCOUNTER — Ambulatory Visit: Payer: 59 | Attending: Internal Medicine | Admitting: Physical Therapy

## 2023-04-16 ENCOUNTER — Other Ambulatory Visit: Payer: Self-pay

## 2023-04-16 ENCOUNTER — Encounter: Payer: Self-pay | Admitting: Physical Therapy

## 2023-04-16 DIAGNOSIS — M545 Low back pain, unspecified: Secondary | ICD-10-CM | POA: Insufficient documentation

## 2023-04-16 DIAGNOSIS — G8929 Other chronic pain: Secondary | ICD-10-CM | POA: Insufficient documentation

## 2023-04-16 DIAGNOSIS — M5459 Other low back pain: Secondary | ICD-10-CM | POA: Insufficient documentation

## 2023-04-16 DIAGNOSIS — R252 Cramp and spasm: Secondary | ICD-10-CM | POA: Diagnosis not present

## 2023-04-16 DIAGNOSIS — M25561 Pain in right knee: Secondary | ICD-10-CM | POA: Diagnosis not present

## 2023-04-16 DIAGNOSIS — M6281 Muscle weakness (generalized): Secondary | ICD-10-CM | POA: Insufficient documentation

## 2023-05-02 ENCOUNTER — Ambulatory Visit: Payer: Self-pay

## 2023-05-02 NOTE — Patient Instructions (Signed)
Visit Information  Thank you for taking time to visit with me today. Please don't hesitate to contact me if I can be of assistance to you.   Following are the goals we discussed today:  Continue to take medications as prescribed. Continue to attend provider visits as scheduled Continue to eat healthy, lean meats, vegetables, fruits, avoid saturated and transfats Contact provider with health questions or concerns as needed Continue to perform exercises recommended by therapist   Our next appointment is by telephone on 05/27/23 at 11:30 am  Please call the care guide team at [phone] if you need to cancel or reschedule your appointment.   If you are experiencing a Mental Health or Behavioral Health Crisis or need someone to talk to, please call the Suicide and Crisis Lifeline: 988 call the [place] National Suicide Prevention Lifeline: (403)220-2581 or TTY: 478-735-2669 TTY 858-723-9536) to talk to a trained counselor call 1-800-273-TALK (toll free, 24 hour hotline)  Kathyrn Sheriff, RN, MSN, BSN, CCM Care Management Coordinator [phone]

## 2023-05-02 NOTE — Patient Outreach (Signed)
  Care Coordination   Follow Up Visit Note   05/02/2023 Name: Marie Hebert MRN: [id number] DOB: Jun 22, 1933  Marie Hebert is a 89 y.o. year old female who sees Marie Plump, MD for primary care. I spoke with  Marie Hebert by phone today.  What matters to the patients health and wellness today?  Marie Hebert reports she went to visit daughter in [place] and had physical therapy sessions while she was there. And reports 40% improvement. She continues with outpatient therapy session. She is without questions or concerns at this time.   Goals Addressed             This Visit's Progress    Care Coordination Activities       Interventions Today    Flowsheet Row Most Recent Value  Chronic Disease   Chronic disease during today's visit Other  [mobility]  General Interventions   General Interventions Discussed/Reviewed General Interventions Reviewed  [Evaluation of current treatment plan for health condition and patient's adherence to plan.]  Exercise Interventions   Exercise Discussed/Reviewed Exercise Discussed  [assessed activity level]  Education Interventions   Education Provided Provided Education  Provided Verbal Education On Medication, When to see the doctor, Other  [advised to continue to attend provider visit as recommended, contact provider with health questions or concerns as needed, take supplements/medicatons as prescribed, eat health, perform therapy exercuses as recommended.]            SDOH assessments and interventions completed:  No  Care Coordination Interventions:  Yes, provided   Follow up plan: Follow up call scheduled for 05/27/23    Encounter Outcome:  Patient Visit Completed   Kathyrn Sheriff, RN, MSN, BSN, CCM Care Management Coordinator [phone]

## 2023-05-08 ENCOUNTER — Encounter: Payer: Self-pay | Admitting: Physical Therapy

## 2023-05-08 ENCOUNTER — Ambulatory Visit: Payer: 59 | Attending: Internal Medicine | Admitting: Physical Therapy

## 2023-05-08 DIAGNOSIS — G8929 Other chronic pain: Secondary | ICD-10-CM | POA: Diagnosis not present

## 2023-05-08 DIAGNOSIS — M6281 Muscle weakness (generalized): Secondary | ICD-10-CM | POA: Insufficient documentation

## 2023-05-08 DIAGNOSIS — M5459 Other low back pain: Secondary | ICD-10-CM | POA: Insufficient documentation

## 2023-05-08 DIAGNOSIS — R252 Cramp and spasm: Secondary | ICD-10-CM | POA: Insufficient documentation

## 2023-05-08 DIAGNOSIS — M25561 Pain in right knee: Secondary | ICD-10-CM | POA: Diagnosis not present

## 2023-05-08 NOTE — Therapy (Signed)
OUTPATIENT PHYSICAL THERAPY THORACOLUMBAR EVALUATION   Patient Name: Marie Hebert MRN: [id number] DOB:07-20-1932, 89 y.o., female Today's Date: 05/08/2023  END OF SESSION:  PT End of Session - 05/08/23 1409     Visit Number 2    Date for PT Re-Evaluation 06/25/23    Authorization Type UHC Dual Complete + Medicaid    Authorization Time Period --    Progress Note Due on Visit 10    PT Start Time 1320    PT Stop Time 1400    PT Time Calculation (min) 40 min    Activity Tolerance Patient tolerated treatment well    Behavior During Therapy WFL for tasks assessed/performed              Past Medical History:  Diagnosis Date   Back pain    Dizziness    Hypertension    Palpitations    Past Surgical History:  Procedure Laterality Date   CATARACT EXTRACTION  2010   right   COLONOSCOPY  2003   LAPAROSCOPIC APPENDECTOMY N/A 09/25/2020   Procedure: APPENDECTOMY LAPAROSCOPIC;  Surgeon: Rodman Pickle, MD;  Location: [facility] OR;  Service: General;  Laterality: N/A;   RETINAL LASER PROCEDURE     Patient Active Problem List   Diagnosis Date Noted   Peripheral neuropathy 10/25/2022   Lumbar radiculopathy 07/25/2022   Soft tissue mass 07/25/2022   Osteopenia of neck of right femur 07/25/2022   Early dry stage nonexudative age-related macular degeneration of both eyes 01/10/2022   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 01/10/2022   Status post surgery 09/25/2020   Essential hypertension 07/12/2017   PAC (premature atrial contraction) 07/12/2017   Mitral regurgitation 07/12/2017   Onychomycosis 08/10/2015   PCP NOTES>>> 04/06/2015   Skin lesion 03/18/2013   Annual physical exam 12/31/2011   DJD (degenerative joint disease) 12/31/2011   Vitamin D deficiency 12/31/2011   Recurrent UTI 10/10/2011   PALPITATIONS, OCCASIONAL 10/14/2007   Backache 09/16/2007    PCP: Wanda Plump, MD   REFERRING PROVIDER: Agnes Lawrence, MD   REFERRING DIAG: M54.50  (ICD-10-CM) - Low back pain, unspecified back pain laterality, unspecified chronicity, unspecified whether sciatica present   Rationale for Evaluation and Treatment: Rehabilitation  THERAPY DIAG:  Other low back pain  Cramp and spasm  Chronic pain of right knee  Muscle weakness (generalized)  ONSET DATE: ongoing since March 2023  SUBJECTIVE:  SUBJECTIVE STATEMENT: Didn't sleep well, has pain all the time in her knee.  Going to visit son in [place], he has stairs and is very concerned about them.   PERTINENT HISTORY:  Osteopenia, DJD, chronic LBP, peripheral neuropathy  PAIN:  Are you having pain? Yes: NPRS scale: 5/10 Pain location: R side low back radiating down to R knee posterior Pain description: ache Aggravating factors: sleep Relieving factors: wake up and do exercises  PRECAUTIONS: None  RED FLAGS: None   WEIGHT BEARING RESTRICTIONS: No  FALLS:  Has patient fallen in last 6 months? No  LIVING ENVIRONMENT: Lives with: lives alone Lives in: House/apartment Stairs: No Has following equipment at home: Single point cane  OCCUPATION: retired  PLOF: Independent  PATIENT GOALS:  Get my pain better  NEXT MD VISIT: 05/22/23 with PCP   OBJECTIVE:   DIAGNOSTIC FINDINGS:  03/25/2019 DG Lumbar spine IMPRESSION: Diffuse osteopenia. Diffuse severe multilevel degenerative changes lumbar spine. No acute bony abnormality.  01/30/2023 XR knees  Arthritis both knees with squaring and subchondral irregularity to both  joint lines.  The right has moderate medial and moderate to significant  lateral narrowing on the left has moderate medial narrowing.  No fracture  or bony lesion or other findings.    PATIENT SURVEYS:  Modified Oswestry 22/50   COGNITION: Overall cognitive status:  Within functional limits for tasks assessed     SENSATION: Not tested  MUSCLE LENGTH: NT  POSTURE: rounded shoulders and forward head  PALPATION: Tenderness R pes anserine, posterior knee, R glut medius, piriformis, QL  LUMBAR ROM:   AROM eval  Flexion To knees  Extension WNL  Right lateral flexion To knee  Left lateral flexion To knee  Right rotation WNL  Left rotation WNL   (Blank rows = not tested)  LOWER EXTREMITY ROM:  R knee flexion 103 deg. , lacking 5 deg extension, L knee flexion 120, lacking 3 deg extension.    LOWER EXTREMITY MMT:    MMT Right eval Left eval  Hip flexion 4p! 4  Hip extension    Hip abduction 5 5  Hip adduction 5 5  Knee flexion 4+p! 5  Knee extension 5 5  Ankle dorsiflexion 5 5  Ankle plantarflexion 5 5   (Blank rows = not tested)  LUMBAR SPECIAL TESTS:  NT  FUNCTIONAL TESTS:  5 times sit to stand: 11.3 seconds without UE assist   GAIT: Distance walked: 23' Assistive device utilized: None Level of assistance: Complete Independence Comments: visually slow  05/08/23  962'   [facility] PT Assessment - 05/08/23 0001       Balance   Balance Assessed Yes      Standardized Balance Assessment   Standardized Balance Assessment Berg Balance Test;Dynamic Gait Index      Berg Balance Test   Sit to Stand Able to stand without using hands and stabilize independently    Standing Unsupported Able to stand safely 2 minutes    Sitting with Back Unsupported but Feet Supported on Floor or Stool Able to sit safely and securely 2 minutes    Stand to Sit Sits safely with minimal use of hands    Transfers Able to transfer safely, minor use of hands    Standing Unsupported with Eyes Closed Able to stand 10 seconds safely    Standing Unsupported with Feet Together Able to place feet together independently and stand 1 minute safely    From Standing, Reach Forward with Outstretched Arm Can reach forward >  12 cm safely (5")    From Standing  Position, Pick up Object from Floor Able to pick up shoe safely and easily    From Standing Position, Turn to Look Behind Over each Shoulder Turn sideways only but maintains balance    Turn 360 Degrees Able to turn 360 degrees safely in 4 seconds or less    Standing Unsupported, Alternately Place Feet on Step/Stool Able to stand independently and safely and complete 8 steps in 20 seconds    Standing Unsupported, One Foot in Front Able to plae foot ahead of the other independently and hold 30 seconds    Standing on One Leg Able to lift leg independently and hold > 10 seconds    Total Score 52    Berg comment: low risk of falls      Dynamic Gait Index   Level Surface Normal    Change in Gait Speed Normal    Gait with Horizontal Head Turns Mild Impairment    Gait with Vertical Head Turns Normal    Gait and Pivot Turn Mild Impairment    Step Over Obstacle Mild Impairment    Step Around Obstacles Normal    Steps Moderate Impairment    Total Score 19             TODAY'S TREATMENT:                                                                                                                              DATE:   04/1323 Therapeutic Activity:   Evaluation of Gait and Balance 962' DGI 19/24 BERG 52/56  04/16/23 EVAL only     PATIENT EDUCATION:  Education details: findings, POC Person educated: Patient Education method: Explanation Education comprehension: verbalized understanding  HOME EXERCISE PROGRAM: TBD  ASSESSMENT:  CLINICAL IMPRESSION: Marie Hebert had new order and records received from [place].  Today evaluated her gait and balance, she scored 52/56 on Berg indicating low risk of falls, and 19/24 on DGI, indicating moderate risk of falls in community dwelling elderly.  She was challenged primarily with head turns while walking, she practiced this in the hallway and demonstrated immediate improvement.  Her primary concern remains her sciatica, which  continues to wake her at night.  Discussed trial of TrDN with Estim next session, as this is an intervention that has not been tried yet.  Marie Hebert continues to demonstrate potential for improvement and would benefit from continued skilled therapy to address impairments.     OBJECTIVE IMPAIRMENTS: decreased activity tolerance, decreased endurance, decreased mobility, difficulty walking, decreased ROM, decreased strength, hypomobility, increased fascial restrictions, impaired perceived functional ability, increased muscle spasms, and pain.   ACTIVITY LIMITATIONS: carrying, lifting, bending, standing, sleeping, stairs, and locomotion level  PARTICIPATION LIMITATIONS: meal prep, cleaning, laundry, shopping, and community activity  PERSONAL FACTORS: Age, Past/current experiences, Social background, Time since onset of injury/illness/exacerbation, and 1-2 comorbidities: Osteopenia, DJD, chronic LBP, peripheral neuropathy  are also affecting patient's functional outcome.   REHAB POTENTIAL: Good  CLINICAL DECISION MAKING: Evolving/moderate complexity  EVALUATION COMPLEXITY: Low   GOALS: Goals reviewed with patient? Yes  SHORT TERM GOALS: Target date: 04/30/2023   Patient will be independent with initial HEP.  Baseline:  Goal status: INITIAL   LONG TERM GOALS: Target date: 06/25/2023   Patient will be independent with advanced/ongoing HEP to improve outcomes and carryover.  Baseline:  Goal status: INITIAL  2.  Patient will report 75% improvement in low back pain/sciatica to improve QOL.  Baseline:  Goal status: INITIAL  3.  Patient will report 75% improvement in R knee pain.   Baseline:  Goal status: INITIAL  4.  Patient will demonstrate improved functional strength as demonstrated by 4+/5 R hip flexion strength without pain. Baseline: 4/5 with pain Goal status: INITIAL  5.  Patient will report at least 6 points improvement on modified Oswestry to demonstrate improved  functional ability.  Baseline: 22/50 Goal status: INITIAL   PLAN:  PT FREQUENCY: 1-2x/week  PT DURATION: 10 weeks  PLANNED INTERVENTIONS: 97110-Therapeutic exercises, 97530- Therapeutic activity, 97112- Neuromuscular re-education, 97535- Self Care, 30865- Manual therapy, [phone]- Gait training, [phone]- Orthotic Fit/training, [phone]- Aquatic Therapy, [phone]- Electrical stimulation (unattended), [id number]- Electrical stimulation (manual), [id number]- Ultrasound, [id number]- Traction (mechanical), [id number]- Ionotophoresis 4mg /ml Dexamethasone, Patient/Family education, Balance training, Stair training, Taping, Dry Needling, Joint mobilization, Joint manipulation, Spinal manipulation, Spinal mobilization, Cryotherapy, and Moist heat.  PLAN FOR NEXT SESSION: review standing exercises, knee strengthening - isometric holds, resistance equipment, modalities PRN - liked estim and cold on knee.  TrDN to glutes/piriformis, low back, with estim   Jena Gauss, PT, DPT  05/08/2023, 2:27 PM

## 2023-05-22 ENCOUNTER — Ambulatory Visit: Payer: 59 | Admitting: Internal Medicine

## 2023-05-27 ENCOUNTER — Ambulatory Visit: Payer: Self-pay

## 2023-05-27 NOTE — Patient Instructions (Signed)
Visit Information  Thank you for taking time to visit with me today. Please don't hesitate to contact me if I can be of assistance to you.   Following are the goals we discussed today:  Continue to take medications as prescribed. Continue to attend provider visits as scheduled Continue to eat healthy, lean meats, vegetables, fruits, avoid saturated and transfats Contact provider with health questions or concerns as needed Continue to work with physical therapist as recommended.  If you are experiencing a Mental Health or Behavioral Health Crisis or need someone to talk to, please call the Suicide and Crisis Lifeline: 988 call the [place] National Suicide Prevention Lifeline: 906-157-3880 or TTY: (321)358-8731 TTY 3132448505) to talk to a trained counselor  Kathyrn Sheriff, RN, MSN, BSN, CCM Care Management Coordinator [phone]

## 2023-05-27 NOTE — Patient Outreach (Signed)
  Care Coordination   Follow Up Visit Note   05/27/2023 Name: Marie Hebert MRN: [id number] DOB: [birth year]  Marie Hebert is a 89 y.o. year old female who sees Wanda Plump, MD for primary care. I spoke with  Marie Hebert by phone today.  What matters to the patients health and wellness today?  Marie Hebert reports she is doing well. She states she is attending outpatient therapy sessions. She only takes vitamins and is not on prescription medications. She denies any questions or concerns and states she will contact RNCM or primary provider, if care management needs in the future. No case management needs identified at this time.  Goals Addressed             This Visit's Progress    COMPLETED: Care Coordination Activities       Interventions Today    Flowsheet Row Most Recent Value  Chronic Disease   Chronic disease during today's visit Other  [mobility/chronic knee pain]  General Interventions   General Interventions Discussed/Reviewed General Interventions Reviewed, Doctor Visits  [Evaluation of current treatment plan for health condition and patient's adherence to plan.]  Doctor Visits Discussed/Reviewed Doctor Visits Discussed, PCP  PCP/Specialist Visits Compliance with follow-up visit  [reviewed upcoming follow up appointments]  Exercise Interventions   Exercise Discussed/Reviewed Exercise Reviewed  Edilia Bo attending outpatient PT therapy sessions]  Education Interventions   Education Provided Provided Education  Provided Verbal Education On Exercise, Medication, When to see the doctor  [advised continue to attend provider visits as recommended, contact provider with health questions or concerns, take medications/vitamins as prescribed or recommended]  Pharmacy Interventions   Pharmacy Dicussed/Reviewed Pharmacy Topics Reviewed  Safety Interventions   Safety Discussed/Reviewed Safety Reviewed            SDOH assessments and interventions completed:   No  Care Coordination Interventions:  Yes, provided   Follow up plan: No further intervention required.   Encounter Outcome:  Patient Visit Completed   Kathyrn Sheriff, RN, MSN, BSN, CCM Care Management Coordinator [phone]

## 2023-05-28 ENCOUNTER — Encounter: Payer: 59 | Admitting: Physical Therapy

## 2023-05-28 ENCOUNTER — Encounter: Payer: Self-pay | Admitting: Physical Therapy

## 2023-05-29 ENCOUNTER — Ambulatory Visit: Payer: 59 | Admitting: Internal Medicine

## 2023-05-31 ENCOUNTER — Encounter: Payer: 59 | Admitting: Physical Therapy

## 2023-06-04 ENCOUNTER — Encounter: Payer: Self-pay | Admitting: Physical Therapy

## 2023-06-04 ENCOUNTER — Ambulatory Visit: Payer: 59 | Attending: Internal Medicine | Admitting: Physical Therapy

## 2023-06-04 DIAGNOSIS — M6281 Muscle weakness (generalized): Secondary | ICD-10-CM | POA: Diagnosis not present

## 2023-06-04 DIAGNOSIS — M5459 Other low back pain: Secondary | ICD-10-CM | POA: Insufficient documentation

## 2023-06-04 DIAGNOSIS — M25561 Pain in right knee: Secondary | ICD-10-CM | POA: Diagnosis not present

## 2023-06-04 DIAGNOSIS — R252 Cramp and spasm: Secondary | ICD-10-CM | POA: Diagnosis not present

## 2023-06-04 DIAGNOSIS — G8929 Other chronic pain: Secondary | ICD-10-CM | POA: Insufficient documentation

## 2023-06-04 NOTE — Therapy (Signed)
OUTPATIENT PHYSICAL THERAPY TREATMENT   Patient Name: Marie Hebert MRN: [id number] DOB:1933-03-16, 89 y.o., female Today's Date: 06/04/2023  END OF SESSION:  PT End of Session - 06/04/23 1020     Visit Number 3    Date for PT Re-Evaluation 06/25/23    Authorization Type UHC Dual Complete + Medicaid    Progress Note Due on Visit 10    PT Start Time 1016    PT Stop Time 1100    PT Time Calculation (min) 44 min    Activity Tolerance Patient tolerated treatment well    Behavior During Therapy WFL for tasks assessed/performed              Past Medical History:  Diagnosis Date   Back pain    Dizziness    Hypertension    Palpitations    Past Surgical History:  Procedure Laterality Date   CATARACT EXTRACTION  2010   right   COLONOSCOPY  2003   LAPAROSCOPIC APPENDECTOMY N/A 09/25/2020   Procedure: APPENDECTOMY LAPAROSCOPIC;  Surgeon: Rodman Pickle, MD;  Location: [facility] OR;  Service: General;  Laterality: N/A;   RETINAL LASER PROCEDURE     Patient Active Problem List   Diagnosis Date Noted   Peripheral neuropathy 10/25/2022   Lumbar radiculopathy 07/25/2022   Soft tissue mass 07/25/2022   Osteopenia of neck of right femur 07/25/2022   Early dry stage nonexudative age-related macular degeneration of both eyes 01/10/2022   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 01/10/2022   Status post surgery 09/25/2020   Essential hypertension 07/12/2017   PAC (premature atrial contraction) 07/12/2017   Mitral regurgitation 07/12/2017   Onychomycosis 08/10/2015   PCP NOTES>>> 04/06/2015   Skin lesion 03/18/2013   Annual physical exam 12/31/2011   DJD (degenerative joint disease) 12/31/2011   Vitamin D deficiency 12/31/2011   Recurrent UTI 10/10/2011   PALPITATIONS, OCCASIONAL 10/14/2007   Backache 09/16/2007    PCP: Wanda Plump, MD   REFERRING PROVIDER: Wanda Plump, MD   REFERRING DIAG: M54.50 (ICD-10-CM) - Low back pain, unspecified back pain  laterality, unspecified chronicity, unspecified whether sciatica present   Rationale for Evaluation and Treatment: Rehabilitation  THERAPY DIAG:  Other low back pain  Cramp and spasm  Chronic pain of right knee  Muscle weakness (generalized)  ONSET DATE: ongoing since March 2023  SUBJECTIVE:                                                                                                                                                                                           SUBJECTIVE STATEMENT: Sometimes her knee is  good, sometimes can't even stand to kick.  Today is so back can't lift her leg ,both of them hurt.    PERTINENT HISTORY:  Osteopenia, DJD, chronic LBP, peripheral neuropathy  PAIN:  Are you having pain? Yes: NPRS scale: 4/10 Pain location: R side low back radiating down to R knee posterior Pain description: ache Aggravating factors: sleep Relieving factors: wake up and do exercises  PRECAUTIONS: None  RED FLAGS: None   WEIGHT BEARING RESTRICTIONS: No  FALLS:  Has patient fallen in last 6 months? No  LIVING ENVIRONMENT: Lives with: lives alone Lives in: House/apartment Stairs: No Has following equipment at home: Single point cane  OCCUPATION: retired  PLOF: Independent  PATIENT GOALS:  Get my pain better  NEXT MD VISIT: 05/22/23 with PCP   OBJECTIVE:   DIAGNOSTIC FINDINGS:  03/25/2019 DG Lumbar spine IMPRESSION: Diffuse osteopenia. Diffuse severe multilevel degenerative changes lumbar spine. No acute bony abnormality.  01/30/2023 XR knees  Arthritis both knees with squaring and subchondral irregularity to both  joint lines.  The right has moderate medial and moderate to significant  lateral narrowing on the left has moderate medial narrowing.  No fracture  or bony lesion or other findings.    PATIENT SURVEYS:  Modified Oswestry 22/50   COGNITION: Overall cognitive status: Within functional limits for tasks  assessed     SENSATION: Not tested  MUSCLE LENGTH: NT  POSTURE: rounded shoulders and forward head  PALPATION: Tenderness R pes anserine, posterior knee, R glut medius, piriformis, QL  LUMBAR ROM:   AROM eval  Flexion To knees  Extension WNL  Right lateral flexion To knee  Left lateral flexion To knee  Right rotation WNL  Left rotation WNL   (Blank rows = not tested)  LOWER EXTREMITY ROM:  R knee flexion 103 deg. , lacking 5 deg extension, L knee flexion 120, lacking 3 deg extension.    LOWER EXTREMITY MMT:    MMT Right eval Left eval  Hip flexion 4p! 4  Hip extension    Hip abduction 5 5  Hip adduction 5 5  Knee flexion 4+p! 5  Knee extension 5 5  Ankle dorsiflexion 5 5  Ankle plantarflexion 5 5   (Blank rows = not tested)  LUMBAR SPECIAL TESTS:  NT  FUNCTIONAL TESTS:  5 times sit to stand: 11.3 seconds without UE assist   GAIT: Distance walked: 53' Assistive device utilized: None Level of assistance: Complete Independence Comments: visually slow  05/08/23  962'     TODAY'S TREATMENT:                                                                                                                              DATE:   06/04/23 Therapeutic Exercise: to improve strength and mobility.  Demo, verbal and tactile cues throughout for technique. Bike L2 x 6 min  Prone hip extension 2 x 10 bil  Manual Therapy: to decrease muscle spasm and  pain and improve mobility STM/TPR to lumbar paraspinals, R quad, Itband skilled palpation and monitoring during dry needling. Trigger Point Dry-Needling  Treatment instructions: Expect mild to moderate muscle soreness. S/S of pneumothorax if dry needled over a lung field, and to seek immediate medical attention should they occur. Patient verbalized understanding of these instructions and education. Patient Consent Given: Yes Education handout provided: Yes Muscles treated: bil lumbar multifidi L4-5 (with Estim), R  vastus lateralis Electrical stimulation performed: Yes Parameters:  30ma increased to twitch then slightly decreased and held x 5 min  Treatment response/outcome: Twitch Response Elicited and Palpable Increase in Muscle Length   05/08/23 Therapeutic Activity:   Evaluation of Gait and Balance 962' DGI 19/24 BERG 52/56  04/16/23 EVAL only     PATIENT EDUCATION:  Education details: TrDN, initial HEP Person educated: Patient Education method: Explanation Education comprehension: verbalized understanding  HOME EXERCISE PROGRAM: Access Code: [email or web address] URL: [email or web address] Date: 06/04/2023 Prepared by: Harrie Foreman  Exercises - Prone Hip Extension  - 1 x daily - 7 x weekly - 1-3 sets - 10 reps  ASSESSMENT:  CLINICAL IMPRESSION: Mirtie E Rawles reported mostly R knee pain today, but still interested in trial of TrDN with Estim to low back, as still having back pain at night.  Tolerated well, followed up with prone hip extension to activate multifidi, initially very challenged with this exercise but improved with reps.  Also noted significant tenderness along R Itband, tolerated manual therapy to R vastus to decrease tightness.   Kamil E Berberian continues to demonstrate potential for improvement and would benefit from continued skilled therapy to address impairments.     OBJECTIVE IMPAIRMENTS: decreased activity tolerance, decreased endurance, decreased mobility, difficulty walking, decreased ROM, decreased strength, hypomobility, increased fascial restrictions, impaired perceived functional ability, increased muscle spasms, and pain.   ACTIVITY LIMITATIONS: carrying, lifting, bending, standing, sleeping, stairs, and locomotion level  PARTICIPATION LIMITATIONS: meal prep, cleaning, laundry, shopping, and community activity  PERSONAL FACTORS: Age, Past/current experiences, Social background, Time since onset of injury/illness/exacerbation, and 1-2  comorbidities: Osteopenia, DJD, chronic LBP, peripheral neuropathy  are also affecting patient's functional outcome.   REHAB POTENTIAL: Good  CLINICAL DECISION MAKING: Evolving/moderate complexity  EVALUATION COMPLEXITY: Low   GOALS: Goals reviewed with patient? Yes  SHORT TERM GOALS: Target date: 04/30/2023   Patient will be independent with initial HEP.  Baseline:  Goal status: IN PROGRESS  06/04/23 returned for first treatment visit, given prone hip extension   LONG TERM GOALS: Target date: 06/25/2023   Patient will be independent with advanced/ongoing HEP to improve outcomes and carryover.  Baseline:  Goal status: IN PROGRESS  2.  Patient will report 75% improvement in low back pain/sciatica to improve QOL.  Baseline:  Goal status: IN PROGRESS  3.  Patient will report 75% improvement in R knee pain.   Baseline:  Goal status: IN PROGRESS  4.  Patient will demonstrate improved functional strength as demonstrated by 4+/5 R hip flexion strength without pain. Baseline: 4/5 with pain Goal status: IN PROGRESS  5.  Patient will report at least 6 points improvement on modified Oswestry to demonstrate improved functional ability.  Baseline: 22/50 Goal status: IN PROGRESS   PLAN:  PT FREQUENCY: 1-2x/week  PT DURATION: 10 weeks  PLANNED INTERVENTIONS: 97110-Therapeutic exercises, 97530- Therapeutic activity, [id number]- Neuromuscular re-education, 97535- Self Care, 24401- Manual therapy, [id number]- Gait training, [phone]- Orthotic Fit/training, [id number]- Aquatic Therapy, 97014- Electrical stimulation (unattended), [id number]- Electrical stimulation (manual), [id number]- Ultrasound, [id number]- Traction (mechanical), [id number]- Ionotophoresis  4mg /ml Dexamethasone, Patient/Family education, Balance training, Stair training, Taping, Dry Needling, Joint mobilization, Joint manipulation, Spinal manipulation, Spinal mobilization, Cryotherapy, and Moist heat.  PLAN FOR NEXT SESSION:  see how responded to TrDN with  estim to low back, any improvement in pain at night, review standing exercises, knee strengthening - isometric holds, resistance equipment, modalities PRN - liked estim and cold on knee.  Hip strengthening as well, Itband very tight  Jena Gauss, PT, DPT  06/04/2023, 11:18 AM

## 2023-06-05 ENCOUNTER — Ambulatory Visit: Payer: 59 | Admitting: Internal Medicine

## 2023-06-05 ENCOUNTER — Encounter: Payer: Self-pay | Admitting: Internal Medicine

## 2023-06-05 VITALS — BP 130/82 | HR 82 | Temp 98.0°F | Resp 16 | Ht 61.0 in | Wt 175.4 lb

## 2023-06-05 DIAGNOSIS — E785 Hyperlipidemia, unspecified: Secondary | ICD-10-CM

## 2023-06-05 LAB — LIPID PANEL
Cholesterol: 247 mg/dL — ABNORMAL HIGH (ref 0–200)
HDL: 86.5 mg/dL (ref >39.00)
LDL Cholesterol: 139 mg/dL — ABNORMAL HIGH (ref 0–99)
NonHDL: 160.89
Total CHOL/HDL Ratio: 3
Triglycerides: 110 mg/dL (ref 0.0–149.0)
VLDL: 22 mg/dL (ref 0.0–40.0)

## 2023-06-05 NOTE — Patient Instructions (Addendum)
   GO TO THE LAB : Get the blood work     Next visit with me by 01/2024.  Physical exam. Please schedule it at the front desk

## 2023-06-05 NOTE — Assessment & Plan Note (Signed)
High cholesterol:   last LDL was 175, increased compared to previous readings, she has been reluctant to take cholesterol medication, she is here to recheck her cholesterol, diet is better.  Advised patient that even if the cholesterol levels improved, she probably still will qualify for medications.  She remains reluctant. DJD, lumbar radiculopathy: Since LOV she requested further PT. RTC 01-2024 CPX

## 2023-06-05 NOTE — Progress Notes (Signed)
   Subjective:    Patient ID: Marie Hebert, female    DOB: Feb 28, 1933, 89 y.o.   MRN: [id number]  DOS:  06/05/2023 Type of visit - description: f/u  Since the last office visit is doing well. Main concern today for the patient is her cholesterol, she is doing better with diet. Denies chest pain or lower extremity edema.  No shortness of breath.  Review of Systems See above   Past Medical History:  Diagnosis Date   Back pain    Dizziness    Hypertension    Palpitations     Past Surgical History:  Procedure Laterality Date   CATARACT EXTRACTION  2010   right   COLONOSCOPY  2003   LAPAROSCOPIC APPENDECTOMY N/A 09/25/2020   Procedure: APPENDECTOMY LAPAROSCOPIC;  Surgeon: Rodman Pickle, MD;  Location: [facility] OR;  Service: General;  Laterality: N/A;   RETINAL LASER PROCEDURE      Current Outpatient Medications  Medication Instructions   ascorbic acid (VITAMIN C) 500 mg, Daily   Cholecalciferol (VITAMIN D) 125 MCG (5000 UT) CAPS 1 capsule, Oral, Daily   Magnesium 500 MG CAPS 1 capsule, Oral, Daily,     Multiple Vitamins-Minerals (ONE-A-DAY WOMENS 50+ PO) 1 tablet, Oral, Daily   Multiple Vitamins-Minerals (PRESERVISION AREDS 2 PO) Oral   Omega-3 Fatty Acids (FISH OIL) 1000 MG CAPS 1 capsule, Oral, Daily,         Objective:   Physical Exam BP 130/82   Pulse 82   Temp 98 F (36.7 C) (Oral)   Resp 16   Ht 5\' 1"  (1.549 m)   Wt 175 lb 6 oz (79.5 kg)   SpO2 98%   BMI 33.14 kg/m  General:   Well developed, NAD, BMI noted. HEENT:  Normocephalic . Face symmetric, atraumatic Lungs:  CTA B Normal respiratory effort, no intercostal retractions, no accessory muscle use. Heart: RRR,  no murmur.  Lower extremities: no pretibial edema bilaterally  Skin: Not pale. Not jaundice Neurologic:  alert & oriented X3.  Speech normal, gait appropriate for age and unassisted Psych--  Cognition and judgment appear intact.  Cooperative with normal attention span and  concentration.  Behavior appropriate. No anxious or depressed appearing.      Assessment     Assessment  HTN (h/o palpitations, on cardiazem) High cholesterol DJD Recurrent UTIs; extensive urology w/u  2015:atrophic vaginitis, eventually decided to try hormonal creams Back pain, chronic, needs a disability parking  Palpitations: Saw cardiology 05-2017: Holter, echo, stress echo essentially negative Osteopenia: T score -1.5 (02/2016) Macular degeneration  PLAN: High cholesterol:   last LDL was 175, increased compared to previous readings, she has been reluctant to take cholesterol medication, she is here to recheck her cholesterol, diet is better.  Advised patient that even if the cholesterol levels improved, she probably still will qualify for medications.  She remains reluctant. DJD, lumbar radiculopathy: Since LOV she requested further PT. RTC 01-2024 CPX

## 2023-06-07 ENCOUNTER — Encounter: Payer: 59 | Admitting: Physical Therapy

## 2023-06-11 ENCOUNTER — Encounter: Payer: Self-pay | Admitting: Physical Therapy

## 2023-06-11 ENCOUNTER — Ambulatory Visit: Payer: 59 | Admitting: Physical Therapy

## 2023-06-11 DIAGNOSIS — R252 Cramp and spasm: Secondary | ICD-10-CM | POA: Diagnosis not present

## 2023-06-11 DIAGNOSIS — M25561 Pain in right knee: Secondary | ICD-10-CM | POA: Diagnosis not present

## 2023-06-11 DIAGNOSIS — M6281 Muscle weakness (generalized): Secondary | ICD-10-CM | POA: Diagnosis not present

## 2023-06-11 DIAGNOSIS — G8929 Other chronic pain: Secondary | ICD-10-CM

## 2023-06-11 DIAGNOSIS — M5459 Other low back pain: Secondary | ICD-10-CM

## 2023-06-11 NOTE — Therapy (Signed)
OUTPATIENT PHYSICAL THERAPY TREATMENT   Patient Name: Marie Hebert MRN: [id number] DOB:June 15, 1933, 89 y.o., female Today's Date: 06/11/2023  END OF SESSION:  PT End of Session - 06/11/23 1026     Visit Number 4    Date for PT Re-Evaluation 06/25/23    Authorization Type [employer] Dual Complete + Medicaid    Progress Note Due on Visit 10    PT Start Time 1021    PT Stop Time 1057    PT Time Calculation (min) 36 min    Activity Tolerance Patient tolerated treatment well    Behavior During Therapy WFL for tasks assessed/performed              Past Medical History:  Diagnosis Date   Back pain    Dizziness    Hypertension    Palpitations    Past Surgical History:  Procedure Laterality Date   CATARACT EXTRACTION  2010   right   COLONOSCOPY  2003   LAPAROSCOPIC APPENDECTOMY N/A 09/25/2020   Procedure: APPENDECTOMY LAPAROSCOPIC;  Surgeon: Rodman Pickle, MD;  Location: [facility] OR;  Service: General;  Laterality: N/A;   RETINAL LASER PROCEDURE     Patient Active Problem List   Diagnosis Date Noted   Peripheral neuropathy 10/25/2022   Lumbar radiculopathy 07/25/2022   Soft tissue mass 07/25/2022   Osteopenia of neck of right femur 07/25/2022   Early dry stage nonexudative age-related macular degeneration of both eyes 01/10/2022   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 01/10/2022   Status post surgery 09/25/2020   Essential hypertension 07/12/2017   PAC (premature atrial contraction) 07/12/2017   Mitral regurgitation 07/12/2017   Onychomycosis 08/10/2015   PCP NOTES>>> 04/06/2015   Skin lesion 03/18/2013   Annual physical exam 12/31/2011   DJD (degenerative joint disease) 12/31/2011   Vitamin D deficiency 12/31/2011   Recurrent UTI 10/10/2011   PALPITATIONS, OCCASIONAL 10/14/2007   Backache 09/16/2007    PCP: Wanda Plump, MD   REFERRING PROVIDER: Wanda Plump, MD   REFERRING DIAG: M54.50 (ICD-10-CM) - Low back pain, unspecified back pain  laterality, unspecified chronicity, unspecified whether sciatica present   Rationale for Evaluation and Treatment: Rehabilitation  THERAPY DIAG:  Other low back pain  Cramp and spasm  Chronic pain of right knee  Muscle weakness (generalized)  ONSET DATE: ongoing since March 2023  SUBJECTIVE:                                                                                                                                                                                           SUBJECTIVE STATEMENT: Sore the first night  after TrDN + Estim, but after that she has been able to sleep.  Last night she slept so deep, she was so happy.    PERTINENT HISTORY:  Osteopenia, DJD, chronic LBP, peripheral neuropathy  PAIN:  Are you having pain? Yes: NPRS scale: 4/10 Pain location: R side low back radiating down to R knee posterior Pain description: ache Aggravating factors: sleep Relieving factors: wake up and do exercises  PRECAUTIONS: None  RED FLAGS: None   WEIGHT BEARING RESTRICTIONS: No  FALLS:  Has patient fallen in last 6 months? No  LIVING ENVIRONMENT: Lives with: lives alone Lives in: House/apartment Stairs: No Has following equipment at home: Single point cane  OCCUPATION: retired  PLOF: Independent  PATIENT GOALS:  Get my pain better  NEXT MD VISIT: 05/22/23 with PCP   OBJECTIVE:   DIAGNOSTIC FINDINGS:  03/25/2019 DG Lumbar spine IMPRESSION: Diffuse osteopenia. Diffuse severe multilevel degenerative changes lumbar spine. No acute bony abnormality.  01/30/2023 XR knees  Arthritis both knees with squaring and subchondral irregularity to both  joint lines.  The right has moderate medial and moderate to significant  lateral narrowing on the left has moderate medial narrowing.  No fracture  or bony lesion or other findings.    PATIENT SURVEYS:  Modified Oswestry 22/50   COGNITION: Overall cognitive status: Within functional limits for tasks  assessed     SENSATION: Not tested  MUSCLE LENGTH: NT  POSTURE: rounded shoulders and forward head  PALPATION: Tenderness R pes anserine, posterior knee, R glut medius, piriformis, QL  LUMBAR ROM:   AROM eval  Flexion To knees  Extension WNL  Right lateral flexion To knee  Left lateral flexion To knee  Right rotation WNL  Left rotation WNL   (Blank rows = not tested)  LOWER EXTREMITY ROM:  R knee flexion 103 deg. , lacking 5 deg extension, L knee flexion 120, lacking 3 deg extension.    LOWER EXTREMITY MMT:    MMT Right eval Left eval  Hip flexion 4p! 4  Hip extension    Hip abduction 5 5  Hip adduction 5 5  Knee flexion 4+p! 5  Knee extension 5 5  Ankle dorsiflexion 5 5  Ankle plantarflexion 5 5   (Blank rows = not tested)  LUMBAR SPECIAL TESTS:  NT  FUNCTIONAL TESTS:  5 times sit to stand: 11.3 seconds without UE assist   GAIT: Distance walked: 35' Assistive device utilized: None Level of assistance: Complete Independence Comments: visually slow  05/08/23  962'     TODAY'S TREATMENT:                                                                                                                              DATE:  06/11/23 Therapeutic Exercise: to improve strength and mobility.  Demo, verbal and tactile cues throughout for technique. Prone hip extension x 5 bil - extremely difficult today  Prone knee bends x 10 bil Standing  hip extension x 5 bil - much easier Reviewed HEP - hip abduction, SLS Tried standing quad stretch with chair - painful on R side Declined printed pictures today Manual Therapy: to decrease muscle spasm and pain and improve mobility STM/TPR to lumbar paraspinals, R quad, Itband skilled palpation and monitoring during dry needling. Trigger Point Dry-Needling  Treatment instructions: Expect mild to moderate muscle soreness. S/S of pneumothorax if dry needled over a lung field, and to seek immediate medical attention should  they occur. Patient verbalized understanding of these instructions and education. Patient Consent Given: Yes Education handout provided: Yes Muscles treated: bil lumbar multifidi L4-5 (with Estim), R vastus lateralis Electrical stimulation performed: Yes Parameters: 30ma increased to twitch then slightly decreased and held x 5 min  Treatment response/outcome: Twitch Response Elicited and Palpable Increase in Muscle Length  06/04/23 Therapeutic Exercise: to improve strength and mobility.  Demo, verbal and tactile cues throughout for technique. Bike L2 x 6 min  Prone hip extension 2 x 10 bil  Manual Therapy: to decrease muscle spasm and pain and improve mobility STM/TPR to lumbar paraspinals, R quad, Itband skilled palpation and monitoring during dry needling. Trigger Point Dry-Needling  Treatment instructions: Expect mild to moderate muscle soreness. S/S of pneumothorax if dry needled over a lung field, and to seek immediate medical attention should they occur. Patient verbalized understanding of these instructions and education. Patient Consent Given: Yes Education handout provided: Yes Muscles treated: bil lumbar multifidi L4-5 (with Estim), R vastus lateralis Electrical stimulation performed: Yes Parameters:  30ma increased to twitch then slightly decreased and held x 5 min  Treatment response/outcome: Twitch Response Elicited and Palpable Increase in Muscle Length  05/08/23 Therapeutic Activity:   Evaluation of Gait and Balance 962' DGI 19/24 BERG 52/56  04/16/23 EVAL only     PATIENT EDUCATION:  Education details: HEP review Person educated: Patient Education method: Medical illustrator Education comprehension: verbalized understanding and returned demonstration  HOME EXERCISE PROGRAM: Access Code: [email or web address] URL: [email or web address] Date: 06/11/2023 Prepared by: Harrie Foreman  Exercises - Prone Hip Extension  - 1 x daily - 7 x weekly  - 1-3 sets - 10 reps - Prone Knee Flexion  - 1 x daily - 7 x weekly - 1 sets - 10 reps - Standing Hip Extension with Counter Support  - 1 x daily - 7 x weekly - 1 sets - 10 reps  ASSESSMENT:  CLINICAL IMPRESSION: Marie Hebert reported significant improvement in pain at night and improved sleep quality after last visit with TrDN + Estim, so repeated today.  Reviewed HEP, given standing hip extension exercises as having significant difficulty with prone hip extension.  Marie Hebert continues to demonstrate potential for improvement and would benefit from continued skilled therapy to address impairments.     OBJECTIVE IMPAIRMENTS: decreased activity tolerance, decreased endurance, decreased mobility, difficulty walking, decreased ROM, decreased strength, hypomobility, increased fascial restrictions, impaired perceived functional ability, increased muscle spasms, and pain.   ACTIVITY LIMITATIONS: carrying, lifting, bending, standing, sleeping, stairs, and locomotion level  PARTICIPATION LIMITATIONS: meal prep, cleaning, laundry, shopping, and community activity  PERSONAL FACTORS: Age, Past/current experiences, Social background, Time since onset of injury/illness/exacerbation, and 1-2 comorbidities: Osteopenia, DJD, chronic LBP, peripheral neuropathy  are also affecting patient's functional outcome.   REHAB POTENTIAL: Good  CLINICAL DECISION MAKING: Evolving/moderate complexity  EVALUATION COMPLEXITY: Low   GOALS: Goals reviewed with patient? Yes  SHORT TERM GOALS: Target date: 04/30/2023   Patient will be independent with initial  HEP.  Baseline:  Goal status: IN PROGRESS  06/04/23 returned for first treatment visit, given prone hip extension   LONG TERM GOALS: Target date: 06/25/2023   Patient will be independent with advanced/ongoing HEP to improve outcomes and carryover.  Baseline:  Goal status: IN PROGRESS  2.  Patient will report 75% improvement in low back  pain/sciatica to improve QOL.  Baseline:  Goal status: IN PROGRESS  3.  Patient will report 75% improvement in R knee pain.   Baseline:  Goal status: IN PROGRESS  4.  Patient will demonstrate improved functional strength as demonstrated by 4+/5 R hip flexion strength without pain. Baseline: 4/5 with pain Goal status: IN PROGRESS  5.  Patient will report at least 6 points improvement on modified Oswestry to demonstrate improved functional ability.  Baseline: 22/50 Goal status: IN PROGRESS   PLAN:  PT FREQUENCY: 1-2x/week  PT DURATION: 10 weeks  PLANNED INTERVENTIONS: 97110-Therapeutic exercises, 97530- Therapeutic activity, [id number]- Neuromuscular re-education, 97535- Self Care, [id number]- Manual therapy, [id number]- Gait training, [phone]- Orthotic Fit/training, [id number]- Aquatic Therapy, [id number]- Electrical stimulation (unattended), [id number]- Electrical stimulation (manual), [id number]- Ultrasound, [id number]- Traction (mechanical), [id number]- Ionotophoresis 4mg /ml Dexamethasone, Patient/Family education, Balance training, Stair training, Taping, Dry Needling, Joint mobilization, Joint manipulation, Spinal manipulation, Spinal mobilization, Cryotherapy, and Moist heat.  PLAN FOR NEXT SESSION:  see how responded to TrDN with estim to low back, any improvement in pain at night, review standing exercises, knee strengthening - isometric holds, resistance equipment, modalities PRN - liked estim and cold on knee.  Hip strengthening as well, Itband very tight  Jena Gauss, PT, DPT  06/11/2023, 11:15 AM

## 2023-06-12 ENCOUNTER — Telehealth: Payer: Self-pay

## 2023-06-12 NOTE — Telephone Encounter (Signed)
Results mailed 

## 2023-06-12 NOTE — Telephone Encounter (Signed)
Copied from CRM [id number]. Topic: Clinical - Lab/Test Results >> Jun 12, 2023 12:18 PM Melissa C wrote: Reason for CRM: Patient stated she received her recent test results through My[facility], however due to not being technologically savvy and English as her second language, she requested the results be mailed to her on paper to her address.

## 2023-06-14 ENCOUNTER — Encounter: Payer: Self-pay | Admitting: Physical Therapy

## 2023-06-14 ENCOUNTER — Ambulatory Visit: Payer: 59 | Admitting: Physical Therapy

## 2023-06-14 DIAGNOSIS — M5459 Other low back pain: Secondary | ICD-10-CM

## 2023-06-14 DIAGNOSIS — M6281 Muscle weakness (generalized): Secondary | ICD-10-CM

## 2023-06-14 DIAGNOSIS — M25561 Pain in right knee: Secondary | ICD-10-CM | POA: Diagnosis not present

## 2023-06-14 DIAGNOSIS — G8929 Other chronic pain: Secondary | ICD-10-CM

## 2023-06-14 DIAGNOSIS — R252 Cramp and spasm: Secondary | ICD-10-CM | POA: Diagnosis not present

## 2023-06-14 NOTE — Therapy (Signed)
OUTPATIENT PHYSICAL THERAPY TREATMENT   Patient Name: Marie Hebert MRN: [id number] DOB:[birth year], 89 y.o., female Today's Date: 06/14/2023  END OF SESSION:  PT End of Session - 06/14/23 1018     Visit Number 5    Date for PT Re-Evaluation 06/25/23    Authorization Type [employer] Dual Complete + Medicaid    Progress Note Due on Visit 10    PT Start Time 1016    PT Stop Time 1100    PT Time Calculation (min) 44 min    Activity Tolerance Patient tolerated treatment well    Behavior During Therapy WFL for tasks assessed/performed              Past Medical History:  Diagnosis Date   Back pain    Dizziness    Hypertension    Palpitations    Past Surgical History:  Procedure Laterality Date   CATARACT EXTRACTION  2010   right   COLONOSCOPY  2003   LAPAROSCOPIC APPENDECTOMY N/A 09/25/2020   Procedure: APPENDECTOMY LAPAROSCOPIC;  Surgeon: Rodman Pickle, MD;  Location: [facility] OR;  Service: General;  Laterality: N/A;   RETINAL LASER PROCEDURE     Patient Active Problem List   Diagnosis Date Noted   Peripheral neuropathy 10/25/2022   Lumbar radiculopathy 07/25/2022   Soft tissue mass 07/25/2022   Osteopenia of neck of right femur 07/25/2022   Early dry stage nonexudative age-related macular degeneration of both eyes 01/10/2022   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 01/10/2022   Status post surgery 09/25/2020   Essential hypertension 07/12/2017   PAC (premature atrial contraction) 07/12/2017   Mitral regurgitation 07/12/2017   Onychomycosis 08/10/2015   PCP NOTES>>> 04/06/2015   Skin lesion 03/18/2013   Annual physical exam 12/31/2011   DJD (degenerative joint disease) 12/31/2011   Vitamin D deficiency 12/31/2011   Recurrent UTI 10/10/2011   PALPITATIONS, OCCASIONAL 10/14/2007   Backache 09/16/2007    PCP: Wanda Plump, MD   REFERRING PROVIDER: Wanda Plump, MD   REFERRING DIAG: M54.50 (ICD-10-CM) - Low back pain, unspecified back pain  laterality, unspecified chronicity, unspecified whether sciatica present   Rationale for Evaluation and Treatment: Rehabilitation  THERAPY DIAG:  Other low back pain  Cramp and spasm  Chronic pain of right knee  Muscle weakness (generalized)  ONSET DATE: ongoing since March 2023  SUBJECTIVE:                                                                                                                                                                                           SUBJECTIVE STATEMENT: Pain at night has  gotten much better, and she didn't have as much soreness after the needles as last time.  Does not have as much pain today overall.   PERTINENT HISTORY:  Osteopenia, DJD, chronic LBP, peripheral neuropathy  PAIN:  Are you having pain? Yes: NPRS scale: 2/10 Pain location: R side low back radiating down to R knee posterior Pain description: ache Aggravating factors: sleep Relieving factors: wake up and do exercises  PRECAUTIONS: None  RED FLAGS: None   WEIGHT BEARING RESTRICTIONS: No  FALLS:  Has patient fallen in last 6 months? No  LIVING ENVIRONMENT: Lives with: lives alone Lives in: House/apartment Stairs: No Has following equipment at home: Single point cane  OCCUPATION: retired  PLOF: Independent  PATIENT GOALS:  Get my pain better  NEXT MD VISIT: 05/22/23 with PCP   OBJECTIVE:   DIAGNOSTIC FINDINGS:  03/25/2019 DG Lumbar spine IMPRESSION: Diffuse osteopenia. Diffuse severe multilevel degenerative changes lumbar spine. No acute bony abnormality.  01/30/2023 XR knees  Arthritis both knees with squaring and subchondral irregularity to both  joint lines.  The right has moderate medial and moderate to significant  lateral narrowing on the left has moderate medial narrowing.  No fracture  or bony lesion or other findings.    PATIENT SURVEYS:  Modified Oswestry 22/50   COGNITION: Overall cognitive status: Within functional limits for tasks  assessed     SENSATION: Not tested  MUSCLE LENGTH: NT  POSTURE: rounded Hebert and forward head  PALPATION: Tenderness R pes anserine, posterior knee, R glut medius, piriformis, QL  LUMBAR ROM:   AROM eval  Flexion To knees  Extension WNL  Right lateral flexion To knee  Left lateral flexion To knee  Right rotation WNL  Left rotation WNL   (Blank rows = not tested)  LOWER EXTREMITY ROM:  R knee flexion 103 deg. , lacking 5 deg extension, L knee flexion 120, lacking 3 deg extension.    LOWER EXTREMITY MMT:    MMT Right eval Left eval  Hip flexion 4p! 4  Hip extension    Hip abduction 5 5  Hip adduction 5 5  Knee flexion 4+p! 5  Knee extension 5 5  Ankle dorsiflexion 5 5  Ankle plantarflexion 5 5   (Blank rows = not tested)  LUMBAR SPECIAL TESTS:  NT  FUNCTIONAL TESTS:  5 times sit to stand: 11.3 seconds without UE assist   GAIT: Distance walked: 76' Assistive device utilized: None Level of assistance: Complete Independence Comments: visually slow  05/08/23  962'     TODAY'S TREATMENT:                                                                                                                              DATE:   06/14/23 Therapeutic Exercise: to improve strength and mobility.  Demo, verbal and tactile cues throughout for technique. Nustep L5 x 6 min  Bridge x 20 Supine hip abduction 2 x 20 GTB SLR  x 10 bil  Hamstring stretches starting with dynamic knee bends then progressing to stretch with strap Manual Therapy: to decrease muscle spasm and pain and improve mobility STM/TPR to R glutes/piriformis, IASTM with foam roller to bil QL, lumbar erector spinae, pin&stretch to R piriformis, STM to R hamstring  06/11/23 Therapeutic Exercise: to improve strength and mobility.  Demo, verbal and tactile cues throughout for technique. Prone hip extension x 5 bil - extremely difficult today  Prone knee bends x 10 bil Standing hip extension x 5 bil  - much easier Reviewed HEP - hip abduction, SLS Tried standing quad stretch with chair - painful on R side Declined printed pictures today Manual Therapy: to decrease muscle spasm and pain and improve mobility STM/TPR to lumbar paraspinals, R quad, Itband skilled palpation and monitoring during dry needling. Trigger Point Dry-Needling  Treatment instructions: Expect mild to moderate muscle soreness. S/S of pneumothorax if dry needled over a lung field, and to seek immediate medical attention should they occur. Patient verbalized understanding of these instructions and education. Patient Consent Given: Yes Education handout provided: Yes Muscles treated: bil lumbar multifidi L4-5 (with Estim), R vastus lateralis Electrical stimulation performed: Yes Parameters: 30ma increased to twitch then slightly decreased and held x 5 min  Treatment response/outcome: Twitch Response Elicited and Palpable Increase in Muscle Length  06/04/23 Therapeutic Exercise: to improve strength and mobility.  Demo, verbal and tactile cues throughout for technique. Bike L2 x 6 min  Prone hip extension 2 x 10 bil  Manual Therapy: to decrease muscle spasm and pain and improve mobility STM/TPR to lumbar paraspinals, R quad, Itband skilled palpation and monitoring during dry needling. Trigger Point Dry-Needling  Treatment instructions: Expect mild to moderate muscle soreness. S/S of pneumothorax if dry needled over a lung field, and to seek immediate medical attention should they occur. Patient verbalized understanding of these instructions and education. Patient Consent Given: Yes Education handout provided: Yes Muscles treated: bil lumbar multifidi L4-5 (with Estim), R vastus lateralis Electrical stimulation performed: Yes Parameters:  30ma increased to twitch then slightly decreased and held x 5 min  Treatment response/outcome: Twitch Response Elicited and Palpable Increase in Muscle Length   PATIENT EDUCATION:   Education details: continue exercises.  Person educated: Patient Education method: Medical illustrator Education comprehension: verbalized understanding and returned demonstration  HOME EXERCISE PROGRAM: Access Code: [email or web address] URL: [email or web address] Date: 06/11/2023 Prepared by: Harrie Foreman  Exercises - Prone Hip Extension  - 1 x daily - 7 x weekly - 1-3 sets - 10 reps - Prone Knee Flexion  - 1 x daily - 7 x weekly - 1 sets - 10 reps - Standing Hip Extension with Counter Support  - 1 x daily - 7 x weekly - 1 sets - 10 reps  ASSESSMENT:  CLINICAL IMPRESSION: Marie Hebert continues to report improved pain and sleep.  Today focused on LE and core strengthening followed by manual therapy to low back and R hamstring, still extremely tender in R hamstring and glutes, overall reported decreased discomfort following interventions.  Marie Hebert continues to demonstrate potential for improvement and would benefit from continued skilled therapy to address impairments.     OBJECTIVE IMPAIRMENTS: decreased activity tolerance, decreased endurance, decreased mobility, difficulty walking, decreased ROM, decreased strength, hypomobility, increased fascial restrictions, impaired perceived functional ability, increased muscle spasms, and pain.   ACTIVITY LIMITATIONS: carrying, lifting, bending, standing, sleeping, stairs, and locomotion level  PARTICIPATION LIMITATIONS: meal prep, cleaning, laundry, shopping, and community activity  PERSONAL FACTORS: Age, Past/current experiences, Social background, Time since onset of injury/illness/exacerbation, and 1-2 comorbidities: Osteopenia, DJD, chronic LBP, peripheral neuropathy  are also affecting patient's functional outcome.   REHAB POTENTIAL: Good  CLINICAL DECISION MAKING: Evolving/moderate complexity  EVALUATION COMPLEXITY: Low   GOALS: Goals reviewed with patient? Yes  SHORT TERM GOALS: Target  date: 04/30/2023   Patient will be independent with initial HEP.  Baseline:  Goal status: IN PROGRESS  06/04/23 returned for first treatment visit, given prone hip extension   LONG TERM GOALS: Target date: 06/25/2023   Patient will be independent with advanced/ongoing HEP to improve outcomes and carryover.  Baseline:  Goal status: IN PROGRESS  2.  Patient will report 75% improvement in low back pain/sciatica to improve QOL.  Baseline:  Goal status: IN PROGRESS  3.  Patient will report 75% improvement in R knee pain.   Baseline:  Goal status: IN PROGRESS  4.  Patient will demonstrate improved functional strength as demonstrated by 4+/5 R hip flexion strength without pain. Baseline: 4/5 with pain Goal status: IN PROGRESS  5.  Patient will report at least 6 points improvement on modified Oswestry to demonstrate improved functional ability.  Baseline: 22/50 Goal status: IN PROGRESS   PLAN:  PT FREQUENCY: 1-2x/week  PT DURATION: 10 weeks  PLANNED INTERVENTIONS: 97110-Therapeutic exercises, 97530- Therapeutic activity, [id number]- Neuromuscular re-education, 97535- Self Care, [id number]- Manual therapy, [phone]- Gait training, [phone]- Orthotic Fit/training, [phone]- Aquatic Therapy, 97014- Electrical stimulation (unattended), [id number]- Electrical stimulation (manual), [id number]- Ultrasound, [id number]- Traction (mechanical), [id number]- Ionotophoresis 4mg /ml Dexamethasone, Patient/Family education, Balance training, Stair training, Taping, Dry Needling, Joint mobilization, Joint manipulation, Spinal manipulation, Spinal mobilization, Cryotherapy, and Moist heat.  PLAN FOR NEXT SESSION:  see how responded to TrDN with estim to low back, any improvement in pain at night, review standing exercises, knee strengthening - isometric holds, resistance equipment, modalities PRN - liked estim and cold on knee.  Hip strengthening as well, Itband very tight  Jena Gauss, PT, DPT  06/14/2023, 11:09 AM

## 2023-06-18 ENCOUNTER — Encounter: Payer: Self-pay | Admitting: Physical Therapy

## 2023-06-18 ENCOUNTER — Ambulatory Visit: Payer: 59 | Admitting: Physical Therapy

## 2023-06-18 DIAGNOSIS — R252 Cramp and spasm: Secondary | ICD-10-CM | POA: Diagnosis not present

## 2023-06-18 DIAGNOSIS — G8929 Other chronic pain: Secondary | ICD-10-CM

## 2023-06-18 DIAGNOSIS — M6281 Muscle weakness (generalized): Secondary | ICD-10-CM

## 2023-06-18 DIAGNOSIS — M25561 Pain in right knee: Secondary | ICD-10-CM | POA: Diagnosis not present

## 2023-06-18 DIAGNOSIS — M5459 Other low back pain: Secondary | ICD-10-CM

## 2023-06-18 NOTE — Therapy (Signed)
OUTPATIENT PHYSICAL THERAPY TREATMENT   Patient Name: Marie Hebert MRN: [id number] DOB:Mar 14, 1933, 89 y.o., female Today's Date: 06/18/2023  END OF SESSION:  PT End of Session - 06/18/23 1027     Visit Number 6    Date for PT Re-Evaluation 06/25/23    Authorization Type [employer] Dual Complete + Medicaid    Progress Note Due on Visit 10    PT Start Time 1020    PT Stop Time 1100    PT Time Calculation (min) 40 min    Activity Tolerance Patient tolerated treatment well    Behavior During Therapy WFL for tasks assessed/performed              Past Medical History:  Diagnosis Date   Back pain    Dizziness    Hypertension    Palpitations    Past Surgical History:  Procedure Laterality Date   CATARACT EXTRACTION  2010   right   COLONOSCOPY  2003   LAPAROSCOPIC APPENDECTOMY N/A 09/25/2020   Procedure: APPENDECTOMY LAPAROSCOPIC;  Surgeon: Rodman Pickle, MD;  Location: [facility] OR;  Service: General;  Laterality: N/A;   RETINAL LASER PROCEDURE     Patient Active Problem List   Diagnosis Date Noted   Peripheral neuropathy 10/25/2022   Lumbar radiculopathy 07/25/2022   Soft tissue mass 07/25/2022   Osteopenia of neck of right femur 07/25/2022   Early dry stage nonexudative age-related macular degeneration of both eyes 01/10/2022   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 01/10/2022   Status post surgery 09/25/2020   Essential hypertension 07/12/2017   PAC (premature atrial contraction) 07/12/2017   Mitral regurgitation 07/12/2017   Onychomycosis 08/10/2015   PCP NOTES>>> 04/06/2015   Skin lesion 03/18/2013   Annual physical exam 12/31/2011   DJD (degenerative joint disease) 12/31/2011   Vitamin D deficiency 12/31/2011   Recurrent UTI 10/10/2011   PALPITATIONS, OCCASIONAL 10/14/2007   Backache 09/16/2007    PCP: Wanda Plump, MD   REFERRING PROVIDER: Wanda Plump, MD   REFERRING DIAG: M54.50 (ICD-10-CM) - Low back pain, unspecified back pain  laterality, unspecified chronicity, unspecified whether sciatica present   Rationale for Evaluation and Treatment: Rehabilitation  THERAPY DIAG:  Other low back pain  Cramp and spasm  Chronic pain of right knee  Muscle weakness (generalized)  ONSET DATE: ongoing since March 2023  SUBJECTIVE:                                                                                                                                                                                           SUBJECTIVE STATEMENT: Had cramps in both  legs last night, tried mustard and it did not help. Did not sleep well.   PERTINENT HISTORY:  Osteopenia, DJD, chronic LBP, peripheral neuropathy  PAIN:  Are you having pain? Yes: NPRS scale: 4/10 Pain location: R side low back radiating down to R knee posterior Pain description: ache Aggravating factors: sleep Relieving factors: wake up and do exercises  PRECAUTIONS: None  RED FLAGS: None   WEIGHT BEARING RESTRICTIONS: No  FALLS:  Has patient fallen in last 6 months? No  LIVING ENVIRONMENT: Lives with: lives alone Lives in: House/apartment Stairs: No Has following equipment at home: Single point cane  OCCUPATION: retired  PLOF: Independent  PATIENT GOALS:  Get my pain better  NEXT MD VISIT: 05/22/23 with PCP   OBJECTIVE:   DIAGNOSTIC FINDINGS:  03/25/2019 DG Lumbar spine IMPRESSION: Diffuse osteopenia. Diffuse severe multilevel degenerative changes lumbar spine. No acute bony abnormality.  01/30/2023 XR knees  Arthritis both knees with squaring and subchondral irregularity to both  joint lines.  The right has moderate medial and moderate to significant  lateral narrowing on the left has moderate medial narrowing.  No fracture  or bony lesion or other findings.    PATIENT SURVEYS:  Modified Oswestry 22/50   COGNITION: Overall cognitive status: Within functional limits for tasks assessed     SENSATION: Not tested  MUSCLE  LENGTH: NT  POSTURE: rounded shoulders and forward head  PALPATION: Tenderness R pes anserine, posterior knee, R glut medius, piriformis, QL  LUMBAR ROM:   AROM eval  Flexion To knees  Extension WNL  Right lateral flexion To knee  Left lateral flexion To knee  Right rotation WNL  Left rotation WNL   (Blank rows = not tested)  LOWER EXTREMITY ROM:  R knee flexion 103 deg. , lacking 5 deg extension, L knee flexion 120, lacking 3 deg extension.    LOWER EXTREMITY MMT:    MMT Right eval Left eval  Hip flexion 4p! 4  Hip extension    Hip abduction 5 5  Hip adduction 5 5  Knee flexion 4+p! 5  Knee extension 5 5  Ankle dorsiflexion 5 5  Ankle plantarflexion 5 5   (Blank rows = not tested)  LUMBAR SPECIAL TESTS:  NT  FUNCTIONAL TESTS:  5 times sit to stand: 11.3 seconds without UE assist   GAIT: Distance walked: 61' Assistive device utilized: None Level of assistance: Complete Independence Comments: visually slow  05/08/23  962'     TODAY'S TREATMENT:                                                                                                                              DATE:   06/18/23 Therapeutic Exercise: to improve strength and mobility.  Demo, verbal and tactile cues throughout for technique. Nustep L5 x 6 min  Runners stretch at wall 3 x 15 sec hold bil - cues to not place too much weight on wrists to avoid  wrist pain.   Manual Therapy: to decrease muscle spasm and pain and improve mobility STM lumbar paraspinals, R UPA mobs lumbar spine grade 1-2, IASTM foam roller to bil calves, thighs and glutes. Skilled palpation and monitoring during dry needling. Trigger Point Dry-Needling  Treatment instructions: Expect mild to moderate muscle soreness. S/S of pneumothorax if dry needled over a lung field, and to seek immediate medical attention should they occur. Patient verbalized understanding of these instructions and education. Patient Consent Given:  Yes Education handout provided: Yes Muscles treated: bil lumbar multifidi L4-5 (with Estim), R vastus lateralis Electrical stimulation performed: Yes Parameters: 30ma increased to twitch then slightly decreased and held x 5 min  Treatment response/outcome: Twitch Response Elicited and Palpable Increase in Muscle Length  06/14/23 Therapeutic Exercise: to improve strength and mobility.  Demo, verbal and tactile cues throughout for technique. Nustep L5 x 6 min  Bridge x 20 Supine hip abduction 2 x 20 GTB SLR x 10 bil  Hamstring stretches starting with dynamic knee bends then progressing to stretch with strap Manual Therapy: to decrease muscle spasm and pain and improve mobility STM/TPR to R glutes/piriformis, IASTM with foam roller to bil QL, lumbar erector spinae, pin&stretch to R piriformis, STM to R hamstring  06/11/23 Therapeutic Exercise: to improve strength and mobility.  Demo, verbal and tactile cues throughout for technique. Prone hip extension x 5 bil - extremely difficult today  Prone knee bends x 10 bil Standing hip extension x 5 bil - much easier Reviewed HEP - hip abduction, SLS Tried standing quad stretch with chair - painful on R side Declined printed pictures today Manual Therapy: to decrease muscle spasm and pain and improve mobility STM/TPR to lumbar paraspinals, R quad, Itband skilled palpation and monitoring during dry needling. Trigger Point Dry-Needling  Treatment instructions: Expect mild to moderate muscle soreness. S/S of pneumothorax if dry needled over a lung field, and to seek immediate medical attention should they occur. Patient verbalized understanding of these instructions and education. Patient Consent Given: Yes Education handout provided: Yes Muscles treated: bil lumbar multifidi L4-5 (with Estim), R vastus lateralis Electrical stimulation performed: Yes Parameters: 30ma increased to twitch then slightly decreased and held x 5 min  Treatment  response/outcome: Twitch Response Elicited and Palpable Increase in Muscle Length  PATIENT EDUCATION:  Education details: HEP update Person educated: Patient Education method: Medical illustrator Education comprehension: verbalized understanding and returned demonstration  HOME EXERCISE PROGRAM: Access Code: [email or web address] URL: [email or web address] Date: 06/18/2023 Prepared by: Harrie Foreman  Exercises - Prone Hip Extension  - 1 x daily - 7 x weekly - 1-3 sets - 10 reps - Prone Knee Flexion  - 1 x daily - 7 x weekly - 1 sets - 10 reps - Standing Hip Extension with Counter Support  - 1 x daily - 7 x weekly - 1 sets - 10 reps - Gastroc Stretch on Wall  - 1 x daily - 7 x weekly - 1 sets - 3 reps - 15 sec hold  ASSESSMENT:  CLINICAL IMPRESSION: Gaige E Etheredge reported increased cramping in both legs last night, interfering with sleep.  Recommending trying stretching before bed, practiced calf stretch on wall.  Also continued with manual therapy including TrDN and Estim as that has had good results with decreased pain at night, tolerated well, followed by IASTM to both calves and thighs to decrease muscle tightness. Alix E Servais continues to demonstrate potential for improvement and would benefit from continued skilled therapy to address impairments.  OBJECTIVE IMPAIRMENTS: decreased activity tolerance, decreased endurance, decreased mobility, difficulty walking, decreased ROM, decreased strength, hypomobility, increased fascial restrictions, impaired perceived functional ability, increased muscle spasms, and pain.   ACTIVITY LIMITATIONS: carrying, lifting, bending, standing, sleeping, stairs, and locomotion level  PARTICIPATION LIMITATIONS: meal prep, cleaning, laundry, shopping, and community activity  PERSONAL FACTORS: Age, Past/current experiences, Social background, Time since onset of injury/illness/exacerbation, and 1-2 comorbidities:  Osteopenia, DJD, chronic LBP, peripheral neuropathy  are also affecting patient's functional outcome.   REHAB POTENTIAL: Good  CLINICAL DECISION MAKING: Evolving/moderate complexity  EVALUATION COMPLEXITY: Low   GOALS: Goals reviewed with patient? Yes  SHORT TERM GOALS: Target date: 04/30/2023   Patient will be independent with initial HEP.  Baseline:  Goal status: MET  06/04/23 returned for first treatment visit, given prone hip extension 06/18/23- reports good compliance   LONG TERM GOALS: Target date: 06/25/2023   Patient will be independent with advanced/ongoing HEP to improve outcomes and carryover.  Baseline:  Goal status: IN PROGRESS 06/18/23- added calf stretches  2.  Patient will report 75% improvement in low back pain/sciatica to improve QOL.  Baseline:  Goal status: IN PROGRESS  3.  Patient will report 75% improvement in R knee pain.   Baseline:  Goal status: IN PROGRESS  4.  Patient will demonstrate improved functional strength as demonstrated by 4+/5 R hip flexion strength without pain. Baseline: 4/5 with pain Goal status: IN PROGRESS  5.  Patient will report at least 6 points improvement on modified Oswestry to demonstrate improved functional ability.  Baseline: 22/50 Goal status: IN PROGRESS   PLAN:  PT FREQUENCY: 1-2x/week  PT DURATION: 10 weeks  PLANNED INTERVENTIONS: 97110-Therapeutic exercises, 97530- Therapeutic activity, [id number]- Neuromuscular re-education, 97535- Self Care, 16109- Manual therapy, [phone]- Gait training, [phone]- Orthotic Fit/training, [phone]- Aquatic Therapy, 97014- Electrical stimulation (unattended), [id number]- Electrical stimulation (manual), [id number]- Ultrasound, [id number]- Traction (mechanical), [id number]- Ionotophoresis 4mg /ml Dexamethasone, Patient/Family education, Balance training, Stair training, Taping, Dry Needling, Joint mobilization, Joint manipulation, Spinal manipulation, Spinal mobilization, Cryotherapy, and Moist heat.  PLAN FOR NEXT  SESSION:  see how responded to TrDN with estim to low back, any improvement in pain at night, review standing exercises, knee strengthening - isometric holds, resistance equipment, modalities PRN - liked estim and cold on knee.  Hip strengthening as well, Itband very tight  Jena Gauss, PT, DPT  06/18/2023, 2:04 PM

## 2023-06-24 ENCOUNTER — Ambulatory Visit: Payer: 59 | Admitting: Physical Therapy

## 2023-06-24 ENCOUNTER — Encounter: Payer: Self-pay | Admitting: Physical Therapy

## 2023-06-24 DIAGNOSIS — G8929 Other chronic pain: Secondary | ICD-10-CM | POA: Diagnosis not present

## 2023-06-24 DIAGNOSIS — M5459 Other low back pain: Secondary | ICD-10-CM

## 2023-06-24 DIAGNOSIS — M6281 Muscle weakness (generalized): Secondary | ICD-10-CM

## 2023-06-24 DIAGNOSIS — M25561 Pain in right knee: Secondary | ICD-10-CM | POA: Diagnosis not present

## 2023-06-24 DIAGNOSIS — R252 Cramp and spasm: Secondary | ICD-10-CM | POA: Diagnosis not present

## 2023-06-24 NOTE — Therapy (Signed)
OUTPATIENT PHYSICAL THERAPY TREATMENT   Patient Name: Marie Hebert MRN: [id number] DOB:Jun 03, 1933, 89 y.o., female Today's Date: 06/24/2023  END OF SESSION:  PT End of Session - 06/24/23 1106     Visit Number 7    Date for PT Re-Evaluation 06/25/23    Authorization Type U[employer] Dual Complete + Medicaid    Progress Note Due on Visit 10    PT Start Time 1103    PT Stop Time 1145    PT Time Calculation (min) 42 min    Activity Tolerance Patient tolerated treatment well    Behavior During Therapy WFL for tasks assessed/performed              Past Medical History:  Diagnosis Date   Back pain    Dizziness    Hypertension    Palpitations    Past Surgical History:  Procedure Laterality Date   CATARACT EXTRACTION  2010   right   COLONOSCOPY  2003   LAPAROSCOPIC APPENDECTOMY N/A 09/25/2020   Procedure: APPENDECTOMY LAPAROSCOPIC;  Surgeon: Rodman Pickle, MD;  Location: [facility] OR;  Service: General;  Laterality: N/A;   RETINAL LASER PROCEDURE     Patient Active Problem List   Diagnosis Date Noted   Peripheral neuropathy 10/25/2022   Lumbar radiculopathy 07/25/2022   Soft tissue mass 07/25/2022   Osteopenia of neck of right femur 07/25/2022   Early dry stage nonexudative age-related macular degeneration of both eyes 01/10/2022   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 01/10/2022   Status post surgery 09/25/2020   Essential hypertension 07/12/2017   PAC (premature atrial contraction) 07/12/2017   Mitral regurgitation 07/12/2017   Onychomycosis 08/10/2015   PCP NOTES>>> 04/06/2015   Skin lesion 03/18/2013   Annual physical exam 12/31/2011   DJD (degenerative joint disease) 12/31/2011   Vitamin D deficiency 12/31/2011   Recurrent UTI 10/10/2011   PALPITATIONS, OCCASIONAL 10/14/2007   Backache 09/16/2007    PCP: Wanda Plump, MD   REFERRING PROVIDER: Wanda Plump, MD   REFERRING DIAG: M54.50 (ICD-10-CM) - Low back pain, unspecified back pain  laterality, unspecified chronicity, unspecified whether sciatica present   Rationale for Evaluation and Treatment: Rehabilitation  THERAPY DIAG:  Other low back pain  Cramp and spasm  Chronic pain of right knee  Muscle weakness (generalized)  ONSET DATE: ongoing since March 2023  SUBJECTIVE:                                                                                                                                                                                           SUBJECTIVE STATEMENT: Still not sleeping well,  both her legs are hurting, tried to do the stretches before bed and couldn't, because of the knees they both hurt front and back.    PERTINENT HISTORY:  Osteopenia, DJD, chronic LBP, peripheral neuropathy  PAIN:  Are you having pain? Yes: NPRS scale: 4/10 Pain location: both knees Pain description: ache Aggravating factors: sleep Relieving factors: wake up and do exercises  PRECAUTIONS: None  RED FLAGS: None   WEIGHT BEARING RESTRICTIONS: No  FALLS:  Has patient fallen in last 6 months? No  LIVING ENVIRONMENT: Lives with: lives alone Lives in: House/apartment Stairs: No Has following equipment at home: Single point cane  OCCUPATION: retired  PLOF: Independent  PATIENT GOALS:  Get my pain better  NEXT MD VISIT: 05/22/23 with PCP   OBJECTIVE:   DIAGNOSTIC FINDINGS:  03/25/2019 DG Lumbar spine IMPRESSION: Diffuse osteopenia. Diffuse severe multilevel degenerative changes lumbar spine. No acute bony abnormality.  01/30/2023 XR knees  Arthritis both knees with squaring and subchondral irregularity to both  joint lines.  The right has moderate medial and moderate to significant  lateral narrowing on the left has moderate medial narrowing.  No fracture  or bony lesion or other findings.    PATIENT SURVEYS:  Modified Oswestry 22/50   COGNITION: Overall cognitive status: Within functional limits for tasks assessed     SENSATION: Not  tested  MUSCLE LENGTH: NT  POSTURE: rounded shoulders and forward head  PALPATION: Tenderness R pes anserine, posterior knee, R glut medius, piriformis, QL  LUMBAR ROM:   AROM eval  Flexion To knees  Extension WNL  Right lateral flexion To knee  Left lateral flexion To knee  Right rotation WNL  Left rotation WNL   (Blank rows = not tested)  LOWER EXTREMITY ROM:  R knee flexion 103 deg. , lacking 5 deg extension, L knee flexion 120, lacking 3 deg extension.    LOWER EXTREMITY MMT:    MMT Right eval Left eval  Hip flexion 4p! 4  Hip extension    Hip abduction 5 5  Hip adduction 5 5  Knee flexion 4+p! 5  Knee extension 5 5  Ankle dorsiflexion 5 5  Ankle plantarflexion 5 5   (Blank rows = not tested)  LUMBAR SPECIAL TESTS:  NT  FUNCTIONAL TESTS:  5 times sit to stand: 11.3 seconds without UE assist   GAIT: Distance walked: 33' Assistive device utilized: None Level of assistance: Complete Independence Comments: visually slow  05/08/23  962'     TODAY'S TREATMENT:                                                                                                                              DATE:   06/24/23 Therapeutic Exercise: to improve strength and mobility.  Demo, verbal and tactile cues throughout for technique. Nustep L5 x 6 min Seated LAQ with isometric hold 3# 10 x 10 sec hold bil Heel taps for eccentric quad strengthening - difficulty  sequencing, switched to step down from phone book, pain on RLE Ultrasound: x 8 min to R knee (x 4 min pes anserine, x 4 min distal quad) 1 MHz, 1.2 w/cm2 cont to decrease inflammation/pain   06/18/23 Therapeutic Exercise: to improve strength and mobility.  Demo, verbal and tactile cues throughout for technique. Nustep L5 x 6 min  Runners stretch at wall 3 x 15 sec hold bil - cues to not place too much weight on wrists to avoid wrist pain.   Manual Therapy: to decrease muscle spasm and pain and improve  mobility STM lumbar paraspinals, R UPA mobs lumbar spine grade 1-2, IASTM foam roller to bil calves, thighs and glutes. Skilled palpation and monitoring during dry needling. Trigger Point Dry-Needling  Treatment instructions: Expect mild to moderate muscle soreness. S/S of pneumothorax if dry needled over a lung field, and to seek immediate medical attention should they occur. Patient verbalized understanding of these instructions and education. Patient Consent Given: Yes Education handout provided: Yes Muscles treated: bil lumbar multifidi L4-5 (with Estim), R vastus lateralis Electrical stimulation performed: Yes Parameters: 30ma increased to twitch then slightly decreased and held x 5 min  Treatment response/outcome: Twitch Response Elicited and Palpable Increase in Muscle Length  06/14/23 Therapeutic Exercise: to improve strength and mobility.  Demo, verbal and tactile cues throughout for technique. Nustep L5 x 6 min  Bridge x 20 Supine hip abduction 2 x 20 GTB SLR x 10 bil  Hamstring stretches starting with dynamic knee bends then progressing to stretch with strap Manual Therapy: to decrease muscle spasm and pain and improve mobility STM/TPR to R glutes/piriformis, IASTM with foam roller to bil QL, lumbar erector spinae, pin&stretch to R piriformis, STM to R hamstring  PATIENT EDUCATION:  Education details: HEP update Person educated: Patient Education method: Medical illustrator Education comprehension: verbalized understanding and returned demonstration  HOME EXERCISE PROGRAM: Access Code: [email or web address] URL: [email or web address] Date: 06/18/2023 Prepared by: Harrie Foreman  Exercises - Prone Hip Extension  - 1 x daily - 7 x weekly - 1-3 sets - 10 reps - Prone Knee Flexion  - 1 x daily - 7 x weekly - 1 sets - 10 reps - Standing Hip Extension with Counter Support  - 1 x daily - 7 x weekly - 1 sets - 10 reps - Gastroc Stretch on Wall  - 1 x daily - 7 x  weekly - 1 sets - 3 reps - 15 sec hold  ASSESSMENT:  CLINICAL IMPRESSION: Marie Hebert reports increased pain in both knees today, R>L, especially tender R medial knee over pes anserine.  Added isometric LAQ to HEP, she has ankle weights at home.  Very challenged with step downs.  Trialed [place] to R pes anserine and distal quad to decrease inflammation and pain today.  Marie Hebert continues to demonstrate potential for improvement and would benefit from continued skilled therapy to address impairments.     OBJECTIVE IMPAIRMENTS: decreased activity tolerance, decreased endurance, decreased mobility, difficulty walking, decreased ROM, decreased strength, hypomobility, increased fascial restrictions, impaired perceived functional ability, increased muscle spasms, and pain.   ACTIVITY LIMITATIONS: carrying, lifting, bending, standing, sleeping, stairs, and locomotion level  PARTICIPATION LIMITATIONS: meal prep, cleaning, laundry, shopping, and community activity  PERSONAL FACTORS: Age, Past/current experiences, Social background, Time since onset of injury/illness/exacerbation, and 1-2 comorbidities: Osteopenia, DJD, chronic LBP, peripheral neuropathy  are also affecting patient's functional outcome.   REHAB POTENTIAL: Good  CLINICAL DECISION MAKING: Evolving/moderate complexity  EVALUATION COMPLEXITY: Low  GOALS: Goals reviewed with patient? Yes  SHORT TERM GOALS: Target date: 04/30/2023   Patient will be independent with initial HEP.  Baseline:  Goal status: MET  06/04/23 returned for first treatment visit, given prone hip extension 06/18/23- reports good compliance   LONG TERM GOALS: Target date: 06/25/2023   Patient will be independent with advanced/ongoing HEP to improve outcomes and carryover.  Baseline:  Goal status: IN PROGRESS 06/18/23- added calf stretches  2.  Patient will report 75% improvement in low back pain/sciatica to improve QOL.  Baseline:  Goal  status: IN PROGRESS  3.  Patient will report 75% improvement in R knee pain.   Baseline:  Goal status: IN PROGRESS  4.  Patient will demonstrate improved functional strength as demonstrated by 4+/5 R hip flexion strength without pain. Baseline: 4/5 with pain Goal status: IN PROGRESS  5.  Patient will report at least 6 points improvement on modified Oswestry to demonstrate improved functional ability.  Baseline: 22/50 Goal status: IN PROGRESS   PLAN:  PT FREQUENCY: 1-2x/week  PT DURATION: 10 weeks  PLANNED INTERVENTIONS: 97110-Therapeutic exercises, 97530- Therapeutic activity, [id number]- Neuromuscular re-education, 97535- Self Care, [id number]- Manual therapy, [phone]- Gait training, [id number]- Orthotic Fit/training, [phone]- Aquatic Therapy, 97014- Electrical stimulation (unattended), [id number]- Electrical stimulation (manual), [id number]- Ultrasound, [id number]- Traction (mechanical), [id number]- Ionotophoresis 4mg /ml Dexamethasone, Patient/Family education, Balance training, Stair training, Taping, Dry Needling, Joint mobilization, Joint manipulation, Spinal manipulation, Spinal mobilization, Cryotherapy, and Moist heat.  PLAN FOR NEXT SESSION:  see how responded to TrDN with estim to low back, any improvement in pain at night, review standing exercises, knee strengthening - isometric holds, resistance equipment, modalities PRN - liked estim and cold on knee.  Hip strengthening as well, Itband very tight  Jena Gauss, PT, DPT  06/24/2023, 12:56 PM

## 2023-06-25 ENCOUNTER — Ambulatory Visit: Payer: 59 | Admitting: Physical Therapy

## 2023-06-25 DIAGNOSIS — M5459 Other low back pain: Secondary | ICD-10-CM | POA: Diagnosis not present

## 2023-06-25 DIAGNOSIS — R252 Cramp and spasm: Secondary | ICD-10-CM

## 2023-06-25 DIAGNOSIS — G8929 Other chronic pain: Secondary | ICD-10-CM | POA: Diagnosis not present

## 2023-06-25 DIAGNOSIS — M6281 Muscle weakness (generalized): Secondary | ICD-10-CM

## 2023-06-25 DIAGNOSIS — M25561 Pain in right knee: Secondary | ICD-10-CM | POA: Diagnosis not present

## 2023-06-25 NOTE — Therapy (Addendum)
 OUTPATIENT PHYSICAL THERAPY TREATMENT   Patient Name: Marie Hebert MRN: [id number] DOB:[birth year], 89 y.o., female Today's Date: 06/25/2023  END OF SESSION:  PT End of Session - 06/25/23 1024     Visit Number 8    Date for PT Re-Evaluation 06/25/23    Authorization Type UHC Dual Complete + Medicaid    Progress Note Due on Visit 10    PT Start Time 1022    PT Stop Time 1054    PT Time Calculation (min) 32 min    Activity Tolerance Patient tolerated treatment well    Behavior During Therapy WFL for tasks assessed/performed              Past Medical History:  Diagnosis Date   Back pain    Dizziness    Hypertension    Palpitations    Past Surgical History:  Procedure Laterality Date   CATARACT EXTRACTION  2010   right   COLONOSCOPY  2003   LAPAROSCOPIC APPENDECTOMY N/A 09/25/2020   Procedure: APPENDECTOMY LAPAROSCOPIC;  Surgeon: Stevie Herlene Righter, MD;  Location: [facility] OR;  Service: General;  Laterality: N/A;   RETINAL LASER PROCEDURE     Patient Active Problem List   Diagnosis Date Noted   Peripheral neuropathy 10/25/2022   Lumbar radiculopathy 07/25/2022   Soft tissue mass 07/25/2022   Osteopenia of neck of right femur 07/25/2022   Early dry stage nonexudative age-related macular degeneration of both eyes 01/10/2022   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 01/10/2022   Status post surgery 09/25/2020   Essential hypertension 07/12/2017   PAC (premature atrial contraction) 07/12/2017   Mitral regurgitation 07/12/2017   Onychomycosis 08/10/2015   PCP NOTES>>> 04/06/2015   Skin lesion 03/18/2013   Annual physical exam 12/31/2011   DJD (degenerative joint disease) 12/31/2011   Vitamin D  deficiency 12/31/2011   Recurrent UTI 10/10/2011   PALPITATIONS, OCCASIONAL 10/14/2007   Backache 09/16/2007    PCP: Amon Aloysius FORBES, MD   REFERRING PROVIDER: Amon Aloysius FORBES, MD   REFERRING DIAG: M54.50 (ICD-10-CM) - Low back pain, unspecified back pain  laterality, unspecified chronicity, unspecified whether sciatica present   Rationale for Evaluation and Treatment: Rehabilitation  THERAPY DIAG:  Other low back pain  Cramp and spasm  Chronic pain of right knee  Muscle weakness (generalized)  ONSET DATE: ongoing since March 2023  SUBJECTIVE:                                                                                                                                                                                           SUBJECTIVE STATEMENT: Last night still bad,  only slept 4 hours, both knees still hurting, R more, no improvement from yesterday.  Even took Advil yesterday after PT and before bed.    PERTINENT HISTORY:  Osteopenia, DJD, chronic LBP, peripheral neuropathy  PAIN:  Are you having pain? Yes: NPRS scale: 4/10 Pain location: both knees Pain description: ache Aggravating factors: sleep Relieving factors: wake up and do exercises  PRECAUTIONS: None  RED FLAGS: None   WEIGHT BEARING RESTRICTIONS: No  FALLS:  Has patient fallen in last 6 months? No  LIVING ENVIRONMENT: Lives with: lives alone Lives in: House/apartment Stairs: No Has following equipment at home: Single point cane  OCCUPATION: retired  PLOF: Independent  PATIENT GOALS:  Get my pain better  NEXT MD VISIT: 05/22/23 with PCP   OBJECTIVE:   DIAGNOSTIC FINDINGS:  03/25/2019 DG Lumbar spine IMPRESSION: Diffuse osteopenia. Diffuse severe multilevel degenerative changes lumbar spine. No acute bony abnormality.  01/30/2023 XR knees  Arthritis both knees with squaring and subchondral irregularity to both  joint lines.  The right has moderate medial and moderate to significant  lateral narrowing on the left has moderate medial narrowing.  No fracture  or bony lesion or other findings.    PATIENT SURVEYS:  Modified Oswestry 22/50   COGNITION: Overall cognitive status: Within functional limits for tasks assessed     SENSATION: Not  tested  MUSCLE LENGTH: NT  POSTURE: rounded shoulders and forward head  PALPATION: Tenderness R pes anserine, posterior knee, R glut medius, piriformis, QL  LUMBAR ROM:   AROM eval  Flexion To knees  Extension WNL  Right lateral flexion To knee  Left lateral flexion To knee  Right rotation WNL  Left rotation WNL   (Blank rows = not tested)  LOWER EXTREMITY ROM:  R knee flexion 103 deg. , lacking 5 deg extension, L knee flexion 120, lacking 3 deg extension.    LOWER EXTREMITY MMT:    MMT Right eval Left eval  Hip flexion 4p! 4  Hip extension    Hip abduction 5 5  Hip adduction 5 5  Knee flexion 4+p! 5  Knee extension 5 5  Ankle dorsiflexion 5 5  Ankle plantarflexion 5 5   (Blank rows = not tested)  LUMBAR SPECIAL TESTS:  NT  FUNCTIONAL TESTS:  5 times sit to stand: 11.3 seconds without UE assist   GAIT: Distance walked: 76' Assistive device utilized: None Level of assistance: Complete [place] Comments: visually slow  05/08/23  962'     TODAY'S TREATMENT:                                                                                                                              DATE:   06/25/23 Therapeutic Exercise: to improve strength and mobility.  Demo, verbal and tactile cues throughout for technique. Nustep L5 x 6 min SLR 2 x 10 bil  R hip flexor stretch SKTC Hamstring stretch bil 3 x 1 min with strap,  therapist facilitating.  Manual Therapy: to decrease muscle spasm and pain and improve mobility IASTM to R quad during R hip flexor stretch   06/24/23 Therapeutic Exercise: to improve strength and mobility.  Demo, verbal and tactile cues throughout for technique. Nustep L5 x 6 min Seated LAQ with isometric hold 3# 10 x 10 sec hold bil Heel taps for eccentric quad strengthening - difficulty sequencing, switched to step down from phone book, pain on RLE Ultrasound: x 8 min to R knee (x 4 min pes anserine, x 4 min distal quad) 1 MHz,  1.2 w/cm2 cont to decrease inflammation/pain   06/18/23 Therapeutic Exercise: to improve strength and mobility.  Demo, verbal and tactile cues throughout for technique. Nustep L5 x 6 min  Runners stretch at wall 3 x 15 sec hold bil - cues to not place too much weight on wrists to avoid wrist pain.   Manual Therapy: to decrease muscle spasm and pain and improve mobility STM lumbar paraspinals, R UPA mobs lumbar spine grade 1-2, IASTM foam roller to bil calves, thighs and glutes. Skilled palpation and monitoring during dry needling. Trigger Point Dry-Needling  Treatment instructions: Expect mild to moderate muscle soreness. S/S of pneumothorax if dry needled over a lung field, and to seek immediate medical attention should they occur. Patient verbalized understanding of these instructions and education. Patient Consent Given: Yes Education handout provided: Yes Muscles treated: bil lumbar multifidi L4-5 (with Estim), R vastus lateralis Electrical stimulation performed: Yes Parameters: 30ma increased to twitch then slightly decreased and held x 5 min  Treatment response/outcome: Twitch Response Elicited and Palpable Increase in Muscle Length  06/14/23 Therapeutic Exercise: to improve strength and mobility.  Demo, verbal and tactile cues throughout for technique. Nustep L5 x 6 min  Bridge x 20 Supine hip abduction 2 x 20 GTB SLR x 10 bil  Hamstring stretches starting with dynamic knee bends then progressing to stretch with strap Manual Therapy: to decrease muscle spasm and pain and improve mobility STM/TPR to R glutes/piriformis, IASTM with foam roller to bil QL, lumbar erector spinae, pin&stretch to R piriformis, STM to R hamstring  PATIENT EDUCATION:  Education details: HEP update Person educated: Patient Education method: Medical Illustrator Education comprehension: verbalized understanding and returned demonstration  HOME EXERCISE PROGRAM: Access Code: [email or web address] URL:  [email or web address] Date: 06/18/2023 Prepared by: Almarie Sprinkles  Exercises - Prone Hip Extension  - 1 x daily - 7 x weekly - 1-3 sets - 10 reps - Prone Knee Flexion  - 1 x daily - 7 x weekly - 1 sets - 10 reps - Standing Hip Extension with Counter Support  - 1 x daily - 7 x weekly - 1 sets - 10 reps - Gastroc Stretch on Wall  - 1 x daily - 7 x weekly - 1 sets - 3 reps - 15 sec hold  ASSESSMENT:  CLINICAL IMPRESSION: Marie Hebert continues to report bil knee pain, R> L, with no improvement from yesterday, had to resort to taking Advil although does not like taking medication.  Today focused on gentle stretches along with hip strengthening, very challenged with SLR, decreased pain after stretching and IASTM to R quad.   Marie Hebert continues to demonstrate potential for improvement and would benefit from continued skilled therapy to address impairments.  Extending POC for additional 2 weeks to 07/12/2023 due to missed visits due to travel for holidays.     OBJECTIVE IMPAIRMENTS: decreased activity tolerance, decreased endurance, decreased mobility, difficulty walking, decreased  ROM, decreased strength, hypomobility, increased fascial restrictions, impaired perceived functional ability, increased muscle spasms, and pain.   ACTIVITY LIMITATIONS: carrying, lifting, bending, standing, sleeping, stairs, and locomotion level  PARTICIPATION LIMITATIONS: meal prep, cleaning, laundry, shopping, and community activity  PERSONAL FACTORS: Age, Past/current experiences, Social background, Time since onset of injury/illness/exacerbation, and 1-2 comorbidities: Osteopenia, DJD, chronic LBP, peripheral neuropathy  are also affecting patient's functional outcome.   REHAB POTENTIAL: Good  CLINICAL DECISION MAKING: Evolving/moderate complexity  EVALUATION COMPLEXITY: Low   GOALS: Goals reviewed with patient? Yes  SHORT TERM GOALS: Target date: 04/30/2023   Patient will  be independent with initial HEP.  Baseline:  Goal status: MET  06/04/23 returned for first treatment visit, given prone hip extension 06/18/23- reports good compliance   LONG TERM GOALS: Target date: 06/25/2023  extended to 07/12/2023 Patient will be independent with advanced/ongoing HEP to improve outcomes and carryover.  Baseline:  Goal status: IN PROGRESS 06/18/23- added calf stretches  2.  Patient will report 75% improvement in low back pain/sciatica to improve QOL.  Baseline:  Goal status: IN PROGRESS  3.  Patient will report 75% improvement in R knee pain.   Baseline:  Goal status: IN PROGRESS  4.  Patient will demonstrate improved functional strength as demonstrated by 4+/5 R hip flexion strength without pain. Baseline: 4/5 with pain Goal status: IN PROGRESS  5.  Patient will report at least 6 points improvement on modified Oswestry to demonstrate improved functional ability.  Baseline: 22/50 Goal status: IN PROGRESS   PLAN:  PT FREQUENCY: 1-2x/week  PT DURATION: 10 weeks  PLANNED INTERVENTIONS: 97110-Therapeutic exercises, 97530- Therapeutic activity, [id number]- Neuromuscular re-education, 97535- Self Care, 02859- Manual therapy, [phone]- Gait training, [phone]- Orthotic Fit/training, [phone]- Aquatic Therapy, [phone]- Electrical stimulation (unattended), [id number]- Electrical stimulation (manual), [id number]- Ultrasound, [id number]- Traction (mechanical), [id number]- Ionotophoresis 4mg /ml Dexamethasone , Patient/Family education, Balance training, Stair training, Taping, Dry Needling, Joint mobilization, Joint manipulation, Spinal manipulation, Spinal mobilization, Cryotherapy, and Moist heat.  PLAN FOR NEXT SESSION:  see how responded to TrDN with estim to low back, any improvement in pain at night, review standing exercises, knee strengthening - isometric holds, resistance equipment, modalities PRN - liked estim and cold on knee.  Hip strengthening as well, Itband very tight  Almarie JINNY Sprinkles, PT,  DPT  06/25/2023, 12:38 PM

## 2023-07-01 ENCOUNTER — Ambulatory Visit: Payer: 59 | Attending: Internal Medicine | Admitting: Physical Therapy

## 2023-07-03 ENCOUNTER — Encounter: Payer: Self-pay | Admitting: Physical Therapy

## 2023-07-03 ENCOUNTER — Ambulatory Visit: Payer: 59 | Attending: Internal Medicine | Admitting: Physical Therapy

## 2023-07-03 DIAGNOSIS — M6281 Muscle weakness (generalized): Secondary | ICD-10-CM | POA: Insufficient documentation

## 2023-07-03 DIAGNOSIS — M5459 Other low back pain: Secondary | ICD-10-CM | POA: Insufficient documentation

## 2023-07-03 DIAGNOSIS — M25561 Pain in right knee: Secondary | ICD-10-CM | POA: Insufficient documentation

## 2023-07-03 DIAGNOSIS — R252 Cramp and spasm: Secondary | ICD-10-CM | POA: Diagnosis not present

## 2023-07-03 DIAGNOSIS — G8929 Other chronic pain: Secondary | ICD-10-CM | POA: Insufficient documentation

## 2023-07-03 NOTE — Addendum Note (Signed)
 Addended by: Jena Gauss on: 07/03/2023 01:07 PM   Modules accepted: Orders

## 2023-07-03 NOTE — Therapy (Signed)
 OUTPATIENT PHYSICAL THERAPY TREATMENT   Patient Name: Marie Hebert MRN: [id number] DOB:May 09, 1933, 89 y.o., female Today's Date: 07/03/2023  END OF SESSION:  PT End of Session - 07/03/23 1021     Visit Number 9    Date for PT Re-Evaluation 07/12/23    Authorization Type [employer] Dual Complete + Medicaid    Progress Note Due on Visit 10    PT Start Time 1020    PT Stop Time 1050    PT Time Calculation (min) 30 min    Activity Tolerance Patient tolerated treatment well    Behavior During Therapy WFL for tasks assessed/performed              Past Medical History:  Diagnosis Date   Back pain    Dizziness    Hypertension    Palpitations    Past Surgical History:  Procedure Laterality Date   CATARACT EXTRACTION  2010   right   COLONOSCOPY  2003   LAPAROSCOPIC APPENDECTOMY N/A 09/25/2020   Procedure: APPENDECTOMY LAPAROSCOPIC;  Surgeon: Stevie Herlene Righter, MD;  Location: [facility] OR;  Service: General;  Laterality: N/A;   RETINAL LASER PROCEDURE     Patient Active Problem List   Diagnosis Date Noted   Peripheral neuropathy 10/25/2022   Lumbar radiculopathy 07/25/2022   Soft tissue mass 07/25/2022   Osteopenia of neck of right femur 07/25/2022   Early dry stage nonexudative age-related macular degeneration of both eyes 01/10/2022   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 01/10/2022   Status post surgery 09/25/2020   Essential hypertension 07/12/2017   PAC (premature atrial contraction) 07/12/2017   Mitral regurgitation 07/12/2017   Onychomycosis 08/10/2015   PCP NOTES>>> 04/06/2015   Skin lesion 03/18/2013   Annual physical exam 12/31/2011   DJD (degenerative joint disease) 12/31/2011   Vitamin D  deficiency 12/31/2011   Recurrent UTI 10/10/2011   PALPITATIONS, OCCASIONAL 10/14/2007   Backache 09/16/2007    PCP: Amon Aloysius FORBES, MD   REFERRING PROVIDER: Amon Aloysius FORBES, MD   REFERRING DIAG: M54.50 (ICD-10-CM) - Low back pain, unspecified back pain  laterality, unspecified chronicity, unspecified whether sciatica present   Rationale for Evaluation and Treatment: Rehabilitation  THERAPY DIAG:  Other low back pain  Cramp and spasm  Chronic pain of right knee  Muscle weakness (generalized)  ONSET DATE: ongoing since March 2023  SUBJECTIVE:                                                                                                                                                                                           SUBJECTIVE STATEMENT: Sometimes good, sometimes bad,  today is a good day.  I already did exercises this morning for my hips.    PERTINENT HISTORY:  Osteopenia, DJD, chronic LBP, peripheral neuropathy  PAIN:  Are you having pain? Yes: NPRS scale: 2/10 Pain location: both knees Pain description: ache Aggravating factors: sleep Relieving factors: wake up and do exercises  PRECAUTIONS: None  RED FLAGS: None   WEIGHT BEARING RESTRICTIONS: No  FALLS:  Has patient fallen in last 6 months? No  LIVING ENVIRONMENT: Lives with: lives alone Lives in: House/apartment Stairs: No Has following equipment at home: Single point cane  OCCUPATION: retired  PLOF: Independent  PATIENT GOALS:  Get my pain better  NEXT MD VISIT: 05/22/23 with PCP   OBJECTIVE:   DIAGNOSTIC FINDINGS:  03/25/2019 DG Lumbar spine IMPRESSION: Diffuse osteopenia. Diffuse severe multilevel degenerative changes lumbar spine. No acute bony abnormality.  01/30/2023 XR knees  Arthritis both knees with squaring and subchondral irregularity to both  joint lines.  The right has moderate medial and moderate to significant  lateral narrowing on the left has moderate medial narrowing.  No fracture  or bony lesion or other findings.    PATIENT SURVEYS:  Modified Oswestry 22/50   COGNITION: Overall cognitive status: Within functional limits for tasks assessed     SENSATION: Not tested  MUSCLE LENGTH: NT  POSTURE: rounded  shoulders and forward head  PALPATION: Tenderness R pes anserine, posterior knee, R glut medius, piriformis, QL  LUMBAR ROM:   AROM eval  Flexion To knees  Extension WNL  Right lateral flexion To knee  Left lateral flexion To knee  Right rotation WNL  Left rotation WNL   (Blank rows = not tested)  LOWER EXTREMITY ROM:  R knee flexion 103 deg. , lacking 5 deg extension, L knee flexion 120, lacking 3 deg extension.    LOWER EXTREMITY MMT:    MMT Right eval Left eval  Hip flexion 4p! 4  Hip extension    Hip abduction 5 5  Hip adduction 5 5  Knee flexion 4+p! 5  Knee extension 5 5  Ankle dorsiflexion 5 5  Ankle plantarflexion 5 5   (Blank rows = not tested)  LUMBAR SPECIAL TESTS:  NT  FUNCTIONAL TESTS:  5 times sit to stand: 11.3 seconds without UE assist   GAIT: Distance walked: 93' Assistive device utilized: None Level of assistance: Complete Independence Comments: visually slow  05/08/23  962'     TODAY'S TREATMENT:                                                                                                                              DATE:   07/03/2023 Therapeutic Exercise: to improve strength and mobility.  Demo, verbal and tactile cues throughout for technique. Nustep L5 x 6 min  Standing lat pull downs 20# x 20 Supine SLR  2 x 10 bil  Manual Therapy: to decrease muscle spasm and pain and improve mobility STM/TPR to  R fibularis muscles  06/25/23 Therapeutic Exercise: to improve strength and mobility.  Demo, verbal and tactile cues throughout for technique. Nustep L5 x 6 min SLR 2 x 10 bil  R hip flexor stretch SKTC Hamstring stretch bil 3 x 1 min with strap, therapist facilitating.  Manual Therapy: to decrease muscle spasm and pain and improve mobility IASTM to R quad during R hip flexor stretch   06/24/23 Therapeutic Exercise: to improve strength and mobility.  Demo, verbal and tactile cues throughout for technique. Nustep L5 x 6  min Seated LAQ with isometric hold 3# 10 x 10 sec hold bil Heel taps for eccentric quad strengthening - difficulty sequencing, switched to step down from phone book, pain on RLE Ultrasound: x 8 min to R knee (x 4 min pes anserine, x 4 min distal quad) 1 MHz, 1.2 w/cm2 cont to decrease inflammation/pain    PATIENT EDUCATION:  Education details: HEP review Person educated: Patient Education method: Medical Illustrator Education comprehension: verbalized understanding and returned demonstration  HOME EXERCISE PROGRAM: Access Code: [email or web address] URL: [email or web address] Date: 06/18/2023 Prepared by: Almarie Sprinkles  Exercises - Prone Hip Extension  - 1 x daily - 7 x weekly - 1-3 sets - 10 reps - Prone Knee Flexion  - 1 x daily - 7 x weekly - 1 sets - 10 reps - Standing Hip Extension with Counter Support  - 1 x daily - 7 x weekly - 1 sets - 10 reps - Gastroc Stretch on Wall  - 1 x daily - 7 x weekly - 1 sets - 3 reps - 15 sec hold  ASSESSMENT:  CLINICAL IMPRESSION: Gracelee E Vogler reports knee pain is better today not enough to complain about.  She is thinking about finishing up next visit and working on her own again.  Today her primary complaint was along R fibularis muscles, as had a very tight tender band, improved significantly after manual therapy.   Lulubelle E Meddings continues to demonstrate potential for improvement and would benefit from continued skilled therapy to address impairments.     OBJECTIVE IMPAIRMENTS: decreased activity tolerance, decreased endurance, decreased mobility, difficulty walking, decreased ROM, decreased strength, hypomobility, increased fascial restrictions, impaired perceived functional ability, increased muscle spasms, and pain.   ACTIVITY LIMITATIONS: carrying, lifting, bending, standing, sleeping, stairs, and locomotion level  PARTICIPATION LIMITATIONS: meal prep, cleaning, laundry, shopping, and community  activity  PERSONAL FACTORS: Age, Past/current experiences, Social background, Time since onset of injury/illness/exacerbation, and 1-2 comorbidities: Osteopenia, DJD, chronic LBP, peripheral neuropathy  are also affecting patient's functional outcome.   REHAB POTENTIAL: Good  CLINICAL DECISION MAKING: Evolving/moderate complexity  EVALUATION COMPLEXITY: Low   GOALS: Goals reviewed with patient? Yes  SHORT TERM GOALS: Target date: 04/30/2023   Patient will be independent with initial HEP.  Baseline:  Goal status: MET  06/04/23 returned for first treatment visit, given prone hip extension 06/18/23- reports good compliance   LONG TERM GOALS: Target date: 06/25/2023  extended to 07/12/2023 Patient will be independent with advanced/ongoing HEP to improve outcomes and carryover.  Baseline:  Goal status: IN PROGRESS 06/18/23- added calf stretches  2.  Patient will report 75% improvement in low back pain/sciatica to improve QOL.  Baseline:  Goal status: IN PROGRESS  3.  Patient will report 75% improvement in R knee pain.   Baseline:  Goal status: IN PROGRESS  4.  Patient will demonstrate improved functional strength as demonstrated by 4+/5 R hip flexion strength without pain. Baseline: 4/5 with pain Goal  status: IN PROGRESS  5.  Patient will report at least 6 points improvement on modified Oswestry to demonstrate improved functional ability.  Baseline: 22/50 Goal status: IN PROGRESS   PLAN:  PT FREQUENCY: 1-2x/week  PT DURATION: 10 weeks  PLANNED INTERVENTIONS: 97110-Therapeutic exercises, 97530- Therapeutic activity, [id number]- Neuromuscular re-education, 97535- Self Care, 02859- Manual therapy, [phone]- Gait training, [phone]- Orthotic Fit/training, [phone]- Aquatic Therapy, [phone]- Electrical stimulation (unattended), [id number]- Electrical stimulation (manual), [id number]- Ultrasound, [id number]- Traction (mechanical), [id number]- Ionotophoresis 4mg /ml Dexamethasone , Patient/Family education, Balance  training, Stair training, Taping, Dry Needling, Joint mobilization, Joint manipulation, Spinal manipulation, Spinal mobilization, Cryotherapy, and Moist heat.  PLAN FOR NEXT SESSION:  see how responded to TrDN with estim to low back, any improvement in pain at night, review standing exercises, knee strengthening - isometric holds, resistance equipment, modalities PRN - liked estim and cold on knee.  Hip strengthening as well, Itband very tight  Almarie JINNY Sprinkles, PT, DPT  07/03/2023, 1:11 PM

## 2023-07-10 ENCOUNTER — Ambulatory Visit: Payer: 59 | Admitting: Physical Therapy

## 2023-07-16 ENCOUNTER — Ambulatory Visit: Payer: 59

## 2023-07-25 LAB — HM AWV

## 2023-07-30 ENCOUNTER — Ambulatory Visit (INDEPENDENT_AMBULATORY_CARE_PROVIDER_SITE_OTHER): Payer: 59 | Admitting: Internal Medicine

## 2023-07-30 ENCOUNTER — Encounter: Payer: Self-pay | Admitting: Internal Medicine

## 2023-07-30 VITALS — BP 130/80 | HR 85 | Temp 98.0°F | Resp 16 | Ht 61.0 in | Wt 179.1 lb

## 2023-07-30 DIAGNOSIS — N390 Urinary tract infection, site not specified: Secondary | ICD-10-CM

## 2023-07-30 NOTE — Patient Instructions (Signed)
Please continue drinking plenty of water.  Bring [place] a sample as soon as you can.  I will send some medication depending on the results.  If you have severe symptoms let us know.

## 2023-07-30 NOTE — Progress Notes (Signed)
   Subjective:    Patient ID: Marie Hebert, female    DOB: Jun 06, 1933, 89 y.o.   MRN: [id number]  DOS:  07/30/2023 Type of visit - description: Acute  Symptoms started 4 days ago: C/o  dysuria. she is drinking plenty of water and cranberry juice but symptoms continue. Denies fever or chills. No gross hematuria. No abdominal or flank pain. No vaginal discharge or vaginal rash  Review of Systems See above   Past Medical History:  Diagnosis Date   Back pain    Dizziness    Hypertension    Palpitations     Past Surgical History:  Procedure Laterality Date   CATARACT EXTRACTION  2010   right   COLONOSCOPY  2003   LAPAROSCOPIC APPENDECTOMY N/A 09/25/2020   Procedure: APPENDECTOMY LAPAROSCOPIC;  Surgeon: Stevie Herlene Righter, MD;  Location: [facility] OR;  Service: General;  Laterality: N/A;   RETINAL LASER PROCEDURE      Current Outpatient Medications  Medication Instructions   ascorbic acid (VITAMIN C) 500 mg, Daily   Cholecalciferol (VITAMIN D ) 125 MCG (5000 UT) CAPS 1 capsule, Daily   Magnesium 500 MG CAPS 1 capsule, Daily   Multiple Vitamins-Minerals (ONE-A-DAY WOMENS 50+ PO) 1 tablet, Daily   Multiple Vitamins-Minerals (PRESERVISION AREDS 2 PO) Take by mouth.   Omega-3 Fatty Acids (FISH OIL) 1000 MG CAPS 1 capsule, Daily       Objective:   Physical Exam BP 130/80   Pulse 85   Temp 98 F (36.7 C) (Oral)   Resp 16   Ht 5' 1 (1.549 m)   Wt 179 lb 2 oz (81.3 kg)   SpO2 98%   BMI 33.85 kg/m  General:   Well developed, NAD, BMI noted.  HEENT:  Normocephalic . Face symmetric, atraumatic   Abdomen:  Not distended, soft, non-tender. No rebound or rigidity.  No flank pain Skin: Not pale. Not jaundice Lower extremities: no pretibial edema bilaterally  Neurologic:  alert & oriented X3.  Speech normal, gait appropriate for age and unassisted Psych--  Cognition and judgment appear intact.  Cooperative with normal attention span and concentration.  Behavior  appropriate. No anxious or depressed appearing.     Assessment    Problem list: HTN (h/o palpitations, on cardiazem) High cholesterol DJD Recurrent UTIs; extensive urology w/u  2015:atrophic vaginitis, eventually decided to try hormonal creams Back pain, chronic, needs a disability parking  Palpitations: Saw cardiology 05-2017: Holter, echo, stress echo essentially negative Osteopenia: T score -1.5 (02/2016) Macular degeneration  PLAN: UTI: Has a history of recurrent UTIs, no recent infections, presents with symptoms consistent with another UTI. Unable to provide a urine sample today, will bring it ASAP.  Further advised with results

## 2023-07-31 ENCOUNTER — Other Ambulatory Visit (INDEPENDENT_AMBULATORY_CARE_PROVIDER_SITE_OTHER): Payer: 59

## 2023-07-31 ENCOUNTER — Encounter: Payer: Self-pay | Admitting: Physical Therapy

## 2023-07-31 ENCOUNTER — Ambulatory Visit: Payer: 59 | Attending: Internal Medicine | Admitting: Physical Therapy

## 2023-07-31 DIAGNOSIS — N390 Urinary tract infection, site not specified: Secondary | ICD-10-CM | POA: Diagnosis not present

## 2023-07-31 DIAGNOSIS — M5459 Other low back pain: Secondary | ICD-10-CM

## 2023-07-31 DIAGNOSIS — R252 Cramp and spasm: Secondary | ICD-10-CM

## 2023-07-31 DIAGNOSIS — M6281 Muscle weakness (generalized): Secondary | ICD-10-CM | POA: Diagnosis not present

## 2023-07-31 DIAGNOSIS — M25561 Pain in right knee: Secondary | ICD-10-CM | POA: Insufficient documentation

## 2023-07-31 DIAGNOSIS — G8929 Other chronic pain: Secondary | ICD-10-CM | POA: Diagnosis not present

## 2023-07-31 NOTE — Progress Notes (Signed)
 Marland Kitchen  Specimen dropped off.

## 2023-07-31 NOTE — Therapy (Signed)
 OUTPATIENT PHYSICAL THERAPY TREATMENT Progress Note Reporting Period 05/08/2023 to 07/31/2023  See note below for Objective Data and Assessment of Progress/Goals.      Patient Name: Marie Hebert MRN: [id number] DOB:26-Nov-1932, 89 y.o., female Today's Date: 07/31/2023  END OF SESSION:  PT End of Session - 07/31/23 1455     Visit Number 10    Date for PT Re-Evaluation 07/12/23    Authorization Type UHC Dual Complete + Medicaid    Progress Note Due on Visit 10    PT Start Time 1450    PT Stop Time 1520    PT Time Calculation (min) 30 min    Activity Tolerance Patient tolerated treatment well    Behavior During Therapy WFL for tasks assessed/performed              Past Medical History:  Diagnosis Date   Back pain    Dizziness    Hypertension    Palpitations    Past Surgical History:  Procedure Laterality Date   CATARACT EXTRACTION  2010   right   COLONOSCOPY  2003   LAPAROSCOPIC APPENDECTOMY N/A 09/25/2020   Procedure: APPENDECTOMY LAPAROSCOPIC;  Surgeon: Stevie Herlene Righter, MD;  Location: [facility] OR;  Service: General;  Laterality: N/A;   RETINAL LASER PROCEDURE     Patient Active Problem List   Diagnosis Date Noted   Peripheral neuropathy 10/25/2022   Lumbar radiculopathy 07/25/2022   Soft tissue mass 07/25/2022   Osteopenia of neck of right femur 07/25/2022   Early dry stage nonexudative age-related macular degeneration of both eyes 01/10/2022   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 01/10/2022   Status post surgery 09/25/2020   Essential hypertension 07/12/2017   PAC (premature atrial contraction) 07/12/2017   Mitral regurgitation 07/12/2017   Onychomycosis 08/10/2015   PCP NOTES>>> 04/06/2015   Skin lesion 03/18/2013   Annual physical exam 12/31/2011   DJD (degenerative joint disease) 12/31/2011   Vitamin D  deficiency 12/31/2011   Recurrent UTI 10/10/2011   PALPITATIONS, OCCASIONAL 10/14/2007   Backache 09/16/2007    PCP:  Amon Aloysius FORBES, MD   REFERRING PROVIDER: Amon Aloysius FORBES, MD   REFERRING DIAG: M54.50 (ICD-10-CM) - Low back pain, unspecified back pain laterality, unspecified chronicity, unspecified whether sciatica present   Rationale for Evaluation and Treatment: Rehabilitation  THERAPY DIAG:  Other low back pain  Cramp and spasm  Chronic pain of right knee  Muscle weakness (generalized)  ONSET DATE: ongoing since March 2023  SUBJECTIVE:  SUBJECTIVE STATEMENT: I feel good.  Still having knee and back pain, but thinks its old age.   Returns after absence due to bad weather followed by illness.   PERTINENT HISTORY:  Osteopenia, DJD, chronic LBP, peripheral neuropathy  PAIN:  Are you having pain? Yes: NPRS scale: 2/10 Pain location: both knees Pain description: ache Aggravating factors: sleep Relieving factors: wake up and do exercises  PRECAUTIONS: None  RED FLAGS: None   WEIGHT BEARING RESTRICTIONS: No  FALLS:  Has patient fallen in last 6 months? No  LIVING ENVIRONMENT: Lives with: lives alone Lives in: House/apartment Stairs: No Has following equipment at home: Single point cane  OCCUPATION: retired  PLOF: Independent  PATIENT GOALS:  Get my pain better  NEXT MD VISIT: 05/22/23 with PCP   OBJECTIVE:   DIAGNOSTIC FINDINGS:  03/25/2019 DG Lumbar spine IMPRESSION: Diffuse osteopenia. Diffuse severe multilevel degenerative changes lumbar spine. No acute bony abnormality.  01/30/2023 XR knees  Arthritis both knees with squaring and subchondral irregularity to both  joint lines.  The right has moderate medial and moderate to significant  lateral narrowing on the left has moderate medial narrowing.  No fracture  or bony lesion or other findings.    PATIENT SURVEYS:  Modified Oswestry  22/50   COGNITION: Overall cognitive status: Within functional limits for tasks assessed     SENSATION: Not tested  MUSCLE LENGTH: NT  POSTURE: rounded shoulders and forward head  PALPATION: Tenderness R pes anserine, posterior knee, R glut medius, piriformis, QL  LUMBAR ROM:   AROM eval  Flexion To knees  Extension WNL  Right lateral flexion To knee  Left lateral flexion To knee  Right rotation WNL  Left rotation WNL   (Blank rows = not tested)  LOWER EXTREMITY ROM:  R knee flexion 103 deg. , lacking 5 deg extension, L knee flexion 120, lacking 3 deg extension.    LOWER EXTREMITY MMT:    MMT Right eval Left eval Right 07/31/23  Hip flexion 4p! 4 5  Hip extension     Hip abduction 5 5 5   Hip adduction 5 5 5   Knee flexion 4+p! 5 5  Knee extension 5 5 5   Ankle dorsiflexion 5 5 5   Ankle plantarflexion 5 5 5    (Blank rows = not tested)  LUMBAR SPECIAL TESTS:  NT  FUNCTIONAL TESTS:  5 times sit to stand: 11.3 seconds without UE assist   GAIT: Distance walked: 73' Assistive device utilized: None Level of assistance: Complete Independence Comments: visually slow  05/08/23  962'     TODAY'S TREATMENT:                                                                                                                              DATE:   07/31/2023 Therapeutic Activity:   Reviewing progress, goals, education, and maintaining progress.  Nustep L5 x 6 min, gait x 600'.     07/03/2023 Therapeutic Exercise:  to improve strength and mobility.  Demo, verbal and tactile cues throughout for technique. Nustep L5 x 6 min  Standing lat pull downs 20# x 20 Supine SLR  2 x 10 bil  Manual Therapy: to decrease muscle spasm and pain and improve mobility STM/TPR to R fibularis muscles  06/25/23 Therapeutic Exercise: to improve strength and mobility.  Demo, verbal and tactile cues throughout for technique. Nustep L5 x 6 min SLR 2 x 10 bil  R hip flexor  stretch SKTC Hamstring stretch bil 3 x 1 min with strap, therapist facilitating.  Manual Therapy: to decrease muscle spasm and pain and improve mobility IASTM to R quad during R hip flexor stretch   06/24/23 Therapeutic Exercise: to improve strength and mobility.  Demo, verbal and tactile cues throughout for technique. Nustep L5 x 6 min Seated LAQ with isometric hold 3# 10 x 10 sec hold bil Heel taps for eccentric quad strengthening - difficulty sequencing, switched to step down from phone book, pain on RLE Ultrasound: x 8 min to R knee (x 4 min pes anserine, x 4 min distal quad) 1 MHz, 1.2 w/cm2 cont to decrease inflammation/pain    PATIENT EDUCATION:  Education details: continue to perform HEP and walk as tolerated.  Person educated: Patient Education method: Medical Illustrator Education comprehension: verbalized understanding and returned demonstration  HOME EXERCISE PROGRAM: Access Code: [email or web address] URL: [email or web address] Date: 06/18/2023 Prepared by: Almarie Sprinkles  Exercises - Prone Hip Extension  - 1 x daily - 7 x weekly - 1-3 sets - 10 reps - Prone Knee Flexion  - 1 x daily - 7 x weekly - 1 sets - 10 reps - Standing Hip Extension with Counter Support  - 1 x daily - 7 x weekly - 1 sets - 10 reps - Gastroc Stretch on Wall  - 1 x daily - 7 x weekly - 1 sets - 3 reps - 15 sec hold  ASSESSMENT:  CLINICAL IMPRESSION: Sagrario E Arpino reports knee and back pain continues to be present but variable, sometimes bad sometimes not, but overall she is more active, walking up to 5,000 steps/day, with significantly improved posture and stride length.  Her LE strength has improved and is pain free meeting LTG # 4.  She is sleeping better now that she had modified small pillow to use between knees.  She feels that most of her pain is due to age and further therapy will not improve so is willing to discharge today.    OBJECTIVE IMPAIRMENTS: decreased  activity tolerance, decreased endurance, decreased mobility, difficulty walking, decreased ROM, decreased strength, hypomobility, increased fascial restrictions, impaired perceived functional ability, increased muscle spasms, and pain.   ACTIVITY LIMITATIONS: carrying, lifting, bending, standing, sleeping, stairs, and locomotion level  PARTICIPATION LIMITATIONS: meal prep, cleaning, laundry, shopping, and community activity  PERSONAL FACTORS: Age, Past/current experiences, Social background, Time since onset of injury/illness/exacerbation, and 1-2 comorbidities: Osteopenia, DJD, chronic LBP, peripheral neuropathy  are also affecting patient's functional outcome.   REHAB POTENTIAL: Good  CLINICAL DECISION MAKING: Evolving/moderate complexity  EVALUATION COMPLEXITY: Low   GOALS: Goals reviewed with patient? Yes  SHORT TERM GOALS: Target date: 04/30/2023   Patient will be independent with initial HEP.  Baseline:  Goal status: MET  06/04/23 returned for first treatment visit, given prone hip extension 06/18/23- reports good compliance   LONG TERM GOALS: Target date: 06/25/2023  extended to 07/12/2023 Patient will be independent with advanced/ongoing HEP to improve outcomes and carryover.  Baseline:  Goal status:  MET 06/18/23- added calf stretches 07/31/23- met for current.   2.  Patient will report 75% improvement in low back pain/sciatica to improve QOL.  Baseline:  Goal status: NOT MET 07/31/23- varies too much to say   3.  Patient will report 75% improvement in R knee pain.   Baseline:  Goal status: NOT MET 07/31/23- varies too much to say  4.  Patient will demonstrate improved functional strength as demonstrated by 4+/5 R hip flexion strength without pain. Baseline: 4/5 with pain Goal status: MET  07/31/23  5.  Patient will report at least 6 points improvement on modified Oswestry to demonstrate improved functional ability.  Baseline: 22/50 Goal status: MET 07/31/23-  16/50  PLAN:  PT FREQUENCY: 1-2x/week  PT DURATION: 10 weeks  PLANNED INTERVENTIONS: 97110-Therapeutic exercises, 97530- Therapeutic activity, 97112- Neuromuscular re-education, 97535- Self Care, 02859- Manual therapy, [phone]- Gait training, [phone]- Orthotic Fit/training, [phone]- Aquatic Therapy, [phone]- Electrical stimulation (unattended), [id number]- Electrical stimulation (manual), [id number]- Ultrasound, [id number]- Traction (mechanical), [id number]- Ionotophoresis 4mg /ml Dexamethasone , Patient/Family education, Balance training, Stair training, Taping, Dry Needling, Joint mobilization, Joint manipulation, Spinal manipulation, Spinal mobilization, Cryotherapy, and Moist heat.  PLAN FOR NEXT SESSION:  discharge to HEP  Almarie JINNY Sprinkles, PT, DPT  07/31/2023, 3:33 PM    PHYSICAL THERAPY DISCHARGE SUMMARY  Visits from Start of Care: 10  Current functional level related to goals / functional outcomes: Improved gait, LE strength, functional mobility   Remaining deficits: Continued knee and LBP   Education / Equipment: HEP  Plan: Patient agrees to discharge.  Patient goals were not met. Patient is being discharged due to meeting the stated rehab goals.     Almarie JINNY Sprinkles, PT, DPT

## 2023-07-31 NOTE — Assessment & Plan Note (Signed)
  UTI: Has a history of recurrent UTIs, no recent infections, presents with symptoms consistent with another UTI. Unable to provide a urine sample today, will bring it ASAP.  Further advised with results

## 2023-08-01 ENCOUNTER — Encounter: Payer: Self-pay | Admitting: Internal Medicine

## 2023-08-01 DIAGNOSIS — H353131 Nonexudative age-related macular degeneration, bilateral, early dry stage: Secondary | ICD-10-CM | POA: Diagnosis not present

## 2023-08-01 DIAGNOSIS — H52223 Regular astigmatism, bilateral: Secondary | ICD-10-CM | POA: Diagnosis not present

## 2023-08-01 DIAGNOSIS — H59812 Chorioretinal scars after surgery for detachment, left eye: Secondary | ICD-10-CM | POA: Diagnosis not present

## 2023-08-01 LAB — URINALYSIS, ROUTINE W REFLEX MICROSCOPIC
Bilirubin Urine: NEGATIVE
Hgb urine dipstick: NEGATIVE
Ketones, ur: NEGATIVE
Nitrite: NEGATIVE
Specific Gravity, Urine: 1.01 (ref 1.000–1.030)
Total Protein, Urine: NEGATIVE
Urine Glucose: NEGATIVE
Urobilinogen, UA: 0.2 (ref 0.0–1.0)
pH: 7 (ref 5.0–8.0)

## 2023-08-01 LAB — URINE CULTURE
MICRO NUMBER:: 16045167
SPECIMEN QUALITY:: ADEQUATE

## 2023-08-01 MED ORDER — NITROFURANTOIN MONOHYD MACRO 100 MG PO CAPS: 100.0000 mg | ORAL_CAPSULE | Freq: Two times a day (BID) | ORAL | 0 refills | Status: DC

## 2023-08-01 NOTE — Addendum Note (Signed)
 Addended by: Ellwood Steidle D on: 08/01/2023 12:01 PM   Modules accepted: Orders

## 2023-10-02 ENCOUNTER — Encounter: Payer: Self-pay | Admitting: Internal Medicine

## 2024-02-14 ENCOUNTER — Ambulatory Visit: Payer: 59 | Admitting: Internal Medicine

## 2024-02-14 ENCOUNTER — Encounter: Payer: Self-pay | Admitting: Internal Medicine

## 2024-02-14 VITALS — BP 144/80 | HR 76 | Temp 97.8°F | Resp 16 | Ht 61.0 in | Wt 174.2 lb

## 2024-02-14 DIAGNOSIS — N390 Urinary tract infection, site not specified: Secondary | ICD-10-CM

## 2024-02-14 DIAGNOSIS — E785 Hyperlipidemia, unspecified: Secondary | ICD-10-CM

## 2024-02-14 DIAGNOSIS — I10 Essential (primary) hypertension: Secondary | ICD-10-CM

## 2024-02-14 DIAGNOSIS — Z2821 Immunization not carried out because of patient refusal: Secondary | ICD-10-CM | POA: Diagnosis not present

## 2024-02-14 DIAGNOSIS — M5416 Radiculopathy, lumbar region: Secondary | ICD-10-CM

## 2024-02-14 DIAGNOSIS — Z0000 Encounter for general adult medical examination without abnormal findings: Secondary | ICD-10-CM | POA: Diagnosis not present

## 2024-02-14 DIAGNOSIS — M85851 Other specified disorders of bone density and structure, right thigh: Secondary | ICD-10-CM

## 2024-02-14 LAB — COMPREHENSIVE METABOLIC PANEL WITH GFR
ALT: 12 U/L (ref 0–35)
AST: 20 U/L (ref 0–37)
Albumin: 4 g/dL (ref 3.5–5.2)
Alkaline Phosphatase: 61 U/L (ref 39–117)
BUN: 15 mg/dL (ref 6–23)
CO2: 27 meq/L (ref 19–32)
Calcium: 9 mg/dL (ref 8.4–10.5)
Chloride: 102 meq/L (ref 96–112)
Creatinine, Ser: 0.55 mg/dL (ref 0.40–1.20)
GFR: 81.43 mL/min (ref >60.00)
Glucose, Bld: 97 mg/dL (ref 70–99)
Potassium: 4.4 meq/L (ref 3.5–5.1)
Sodium: 137 meq/L (ref 135–145)
Total Bilirubin: 0.6 mg/dL (ref 0.2–1.2)
Total Protein: 6.6 g/dL (ref 6.0–8.3)

## 2024-02-14 LAB — LIPID PANEL
Cholesterol: 212 mg/dL — ABNORMAL HIGH (ref 0–200)
HDL: 67 mg/dL (ref >39.00)
LDL Cholesterol: 128 mg/dL — ABNORMAL HIGH (ref 0–99)
NonHDL: 145.08
Total CHOL/HDL Ratio: 3
Triglycerides: 84 mg/dL (ref 0.0–149.0)
VLDL: 16.8 mg/dL (ref 0.0–40.0)

## 2024-02-14 LAB — CBC WITH DIFFERENTIAL/PLATELET
Basophils Absolute: 0 K/uL (ref 0.0–0.1)
Basophils Relative: 0.7 % (ref 0.0–3.0)
Eosinophils Absolute: 0.1 K/uL (ref 0.0–0.7)
Eosinophils Relative: 2.4 % (ref 0.0–5.0)
HCT: 39.6 % (ref 36.0–46.0)
Hemoglobin: 13.1 g/dL (ref 12.0–15.0)
Lymphocytes Relative: 43.3 % (ref 12.0–46.0)
Lymphs Abs: 2.5 K/uL (ref 0.7–4.0)
MCHC: 33.2 g/dL (ref 30.0–36.0)
MCV: 88.4 fl (ref 78.0–100.0)
Monocytes Absolute: 0.5 K/uL (ref 0.1–1.0)
Monocytes Relative: 8.4 % (ref 3.0–12.0)
Neutro Abs: 2.6 K/uL (ref 1.4–7.7)
Neutrophils Relative %: 45.2 % (ref 43.0–77.0)
Platelets: 248 K/uL (ref 150.0–400.0)
RBC: 4.48 Mil/uL (ref 3.87–5.11)
RDW: 14.6 % (ref 11.5–15.5)
WBC: 5.8 K/uL (ref 4.0–10.5)

## 2024-02-14 LAB — HEMOGLOBIN A1C: Hgb A1c MFr Bld: 5.8 % (ref 4.6–6.5)

## 2024-02-14 NOTE — Progress Notes (Signed)
   Subjective:    Patient ID: Marie Hebert, female    DOB: Jul 31, 1932, 89 y.o.   MRN: [id number]  DOS:  02/14/2024 Type of visit - description: CPX  Here for CPX States she feels great. Had right knee pain, saw a chiropractor, knee was manipulated and now she feels well. Having a mild runny nose for 10 days, denies fever or chills.  No cough.  Review of Systems See above   Past Medical History:  Diagnosis Date   Back pain    Dizziness    Hypertension    Palpitations     Past Surgical History:  Procedure Laterality Date   CATARACT EXTRACTION  2010   right   COLONOSCOPY  2003   LAPAROSCOPIC APPENDECTOMY N/A 09/25/2020   Procedure: APPENDECTOMY LAPAROSCOPIC;  Surgeon: Stevie Herlene Righter, MD;  Location: [facility] OR;  Service: General;  Laterality: N/A;   RETINAL LASER PROCEDURE      Current Outpatient Medications  Medication Instructions   ascorbic acid (VITAMIN C) 500 mg, Daily   Cholecalciferol (VITAMIN D ) 125 MCG (5000 UT) CAPS 1 capsule, Daily   Magnesium 500 MG CAPS 1 capsule, Daily   Multiple Vitamins-Minerals (ONE-A-DAY WOMENS 50+ PO) 1 tablet, Daily   Multiple Vitamins-Minerals (PRESERVISION AREDS 2 PO) Take by mouth.   Omega-3 Fatty Acids (FISH OIL) 1000 MG CAPS 1 capsule, Daily       Objective:   Physical Exam BP (!) 144/80   Pulse 76   Temp 97.8 F (36.6 C) (Oral)   Resp 16   Ht 5' 1 (1.549 m)   Wt 174 lb 4 oz (79 kg)   SpO2 96%   BMI 32.92 kg/m  General: Well developed, NAD, BMI noted Neck: No  thyromegaly  HEENT:  Normocephalic . Face symmetric, atraumatic Lungs:  CTA B Normal respiratory effort, no intercostal retractions, no accessory muscle use. Heart: RRR,  no murmur.  Abdomen:  Not distended, soft, non-tender. No rebound or rigidity.   Lower extremities: no pretibial edema bilaterally  Skin: Exposed areas without rash. Not pale. Not jaundice Neurologic:  alert & oriented X3.  Speech normal, gait appropriate for age and  unassisted Strength symmetric and appropriate for age.  Psych: Cognition and judgment appear intact.  Cooperative with normal attention span and concentration.  Behavior appropriate. No anxious or depressed appearing.     Assessment     Problem list: HTN (h/o palpitations, on cardiazem) High cholesterol DJD Recurrent UTIs; extensive urology w/u  2015:atrophic vaginitis, eventually decided to try hormonal creams Back pain, chronic, needs a disability parking  Palpitations: Saw cardiology 05-2017: Holter, echo, stress echo essentially negative Osteopenia: T score -1.5 (02/2016) Macular degeneration  PLAN: Here for CPX Vaccine advice provided, has declined vaccines consistently. Labs: See orders  Encouraged to stay active.  Other issues addressed today: HTN, history of.  Not on medications, ambulatory BPs 120/70.  BP today 144/80, recommend to continue monitoring. Dyslipidemia: Last LDL 139, much improved compared to previous readings.  On diet control.  Labs. Recurrent UTIs: Currently asymptomatic RTC 1 year

## 2024-02-14 NOTE — Patient Instructions (Signed)
 Continue checking your blood pressure regularly Blood pressure goal:  between 110/65 and  135/85. If it is consistently higher or lower, let me know  Vaccines I recommend Tetanus shot Shingrix RSV Pneumonia shot Flu shot every fall COVID booster RSV    GO TO THE LAB :  Get the blood work   Your results will be posted on MyChart with my comments  Go to the front desk for the checkout Please make an appointment for a physical exam in 1 year

## 2024-02-16 ENCOUNTER — Encounter: Payer: Self-pay | Admitting: Internal Medicine

## 2024-02-16 NOTE — Assessment & Plan Note (Signed)
 Here for CPX  Other issues addressed today: HTN, history of.  Not on medications, ambulatory BPs 120/70.  BP today 144/80, recommend to continue monitoring. Dyslipidemia: Last LDL 139, much improved compared to previous readings.  On diet control.  Labs. Recurrent UTIs: Currently asymptomatic RTC 1 year

## 2024-02-16 NOTE — Assessment & Plan Note (Signed)
 Here for CPX Vaccine advice provided, has declined vaccines consistently. Labs: See orders  Encouraged to stay active.

## 2024-02-17 ENCOUNTER — Ambulatory Visit: Payer: Self-pay | Admitting: Internal Medicine

## 2024-04-21 ENCOUNTER — Ambulatory Visit

## 2024-04-21 VITALS — Ht 61.0 in | Wt 174.0 lb

## 2024-04-21 DIAGNOSIS — Z0000 Encounter for general adult medical examination without abnormal findings: Secondary | ICD-10-CM

## 2024-04-21 NOTE — Progress Notes (Signed)
 Subjective:   Marie Hebert is a 89 y.o. who presents for a Medicare Wellness preventive visit.  As a reminder, Annual Wellness Visits don't include a physical exam, and some assessments may be limited, especially if this visit is performed virtually. We may recommend an in-person follow-up visit with your provider if needed.  Visit Complete: Virtual I connected with  Marie Hebert on 04/21/24 by a audio enabled telemedicine application and verified that I am speaking with the correct person using two identifiers.  Patient Location: Home  Provider Location: Office/Clinic  I discussed the limitations of evaluation and management by telemedicine. The patient expressed understanding and agreed to proceed.  Vital Signs: Because this visit was a virtual/telehealth visit, some criteria may be missing or patient reported. Any vitals not documented were not able to be obtained and vitals that have been documented are patient reported.  VideoDeclined- This patient declined Librarian, academic. Therefore the visit was completed with audio only.  Persons Participating in Visit: Patient.  AWV Questionnaire: No: Patient Medicare AWV questionnaire was not completed prior to this visit.  Cardiac Risk Factors include: advanced age (>74men, >20 women);hypertension;Other (see comment), Risk factor comments: palpitation     Objective:    Today's Vitals   04/21/24 1022  Weight: 174 lb (78.9 kg)  Height: 5' 1 (1.549 m)   Body mass index is 32.88 kg/m.     04/21/2024   10:37 AM 04/16/2023    2:26 PM 05/23/2022   10:31 AM 09/25/2020    8:32 AM  Advanced Directives  Does Patient Have a Medical Advance Directive? No No No No  Would patient like information on creating a medical advance directive? No - Patient declined No - Patient declined No - Patient declined     Current Medications (verified) Outpatient Encounter Medications as of 04/21/2024   Medication Sig   Cholecalciferol (VITAMIN D ) 125 MCG (5000 UT) CAPS Take 1 capsule by mouth daily.   Magnesium 500 MG CAPS Take 1 capsule by mouth daily.   Multiple Vitamins-Minerals (ONE-A-DAY WOMENS 50+ PO) Take 1 tablet by mouth daily.   Multiple Vitamins-Minerals (PRESERVISION AREDS 2 PO) Take by mouth.   Omega-3 Fatty Acids (FISH OIL) 1000 MG CAPS Take 1 capsule by mouth daily.   vitamin C (ASCORBIC ACID) 500 MG tablet Take 500 mg by mouth daily.   No facility-administered encounter medications on file as of 04/21/2024.    Allergies (verified) Aspirin and Ibuprofen   History: Past Medical History:  Diagnosis Date   Back pain    Dizziness    Hypertension    Palpitations    Past Surgical History:  Procedure Laterality Date   CATARACT EXTRACTION  2010   right   COLONOSCOPY  2003   LAPAROSCOPIC APPENDECTOMY N/A 09/25/2020   Procedure: APPENDECTOMY LAPAROSCOPIC;  Surgeon: Kinsinger, Herlene Righter, MD;  Location: [facility] OR;  Service: General;  Laterality: N/A;   RETINAL LASER PROCEDURE     Family History  Problem Relation Age of Onset   Cancer Father    Stomach cancer Father        h pylori   Diabetes Son    CAD Neg Hx    Colon cancer Neg Hx    Breast cancer Neg Hx    Social History   Socioeconomic History   Marital status: Widowed    Spouse name: Not on file   Number of children: 4   Years of education: Not on file   Highest  education level: Not on file  Occupational History   Occupation: n/a  Tobacco Use   Smoking status: Never   Smokeless tobacco: Never  Vaping Use   Vaping status: Never Used  Substance and Sexual Activity   Alcohol use: No    Alcohol/week: 0.0 standard drinks of alcohol   Drug use: Not on file   Sexual activity: Not on file  Other Topics Concern   Not on file  Social History Narrative   Original from [place]   Widower   Still drives   Lives by herself     Social Drivers of Health   Financial Resource Strain: Low Risk  (04/21/2024)    Overall Financial Resource Strain (CARDIA)    Difficulty of Paying Living Expenses: Not very hard  Food Insecurity: No Food Insecurity (04/21/2024)   Hunger Vital Sign    Worried About Running Out of Food in the Last Year: Never true    Ran Out of Food in the Last Year: Never true  Transportation Needs: No Transportation Needs (04/21/2024)   PRAPARE - Administrator, Civil Service (Medical): No    Lack of Transportation (Non-Medical): No  Physical Activity: Sufficiently Active (04/21/2024)   Exercise Vital Sign    Days of Exercise per Week: 5 days    Minutes of Exercise per Session: 30 min  Stress: No Stress Concern Present (04/21/2024)   [employer] of Occupational Health - Occupational Stress Questionnaire    Feeling of Stress: Not at all  Social Connections: Moderately Isolated (04/21/2024)   Social Connection and Isolation Panel    Frequency of Communication with Friends and Family: Once a week    Frequency of Social Gatherings with Friends and Family: More than three times a week    Attends Religious Services: More than 4 times per year    Active Member of [employer] or Organizations: No    Attends Banker Meetings: Never    Marital Status: Widowed    Tobacco Counseling Counseling given: Not Answered    Clinical Intake:  Pre-visit preparation completed: Yes  Pain : No/denies pain     BMI - recorded: 32.88 Nutritional Status: BMI > 30  Obese Nutritional Risks: None Diabetes: No  Lab Results  Component Value Date   HGBA1C 5.8 02/14/2024   HGBA1C 5.5 02/14/2023   HGBA1C 5.5 07/10/2021     How often do you need to have someone help you when you read instructions, pamphlets, or other written materials from your doctor or pharmacy?: 1 - Never  Interpreter Needed?: No  Information entered by :: Lolita Libra, CMA(AAM)   Activities of Daily Living     04/21/2024   10:28 AM  In your present state of health, do you have any  difficulty performing the following activities:  Hearing? 0  Vision? 0  Difficulty concentrating or making decisions? 0  Walking or climbing stairs? 1  Comment knee pains  Dressing or bathing? 0  Doing errands, shopping? 0  Preparing Food and eating ? N  Using the Toilet? N  In the past six months, have you accidently leaked urine? N  Do you have problems with loss of bowel control? N  Managing your Medications? N  Managing your Finances? N  Housekeeping or managing your Housekeeping? N    Patient Care Team: Amon Aloysius BRAVO, MD as PCP - General (Internal Medicine) Alvaro Ricardo KATHEE Raddle., MD as Consulting Physician (Urology) Winthrop Zachary GRADE, DC (Chiropractic Medicine) Mable Lenis, MD (Inactive)  as Consulting Physician (Otolaryngology) Radionchenko, [place] GAILS, MD as Referring Physician (Ophthalmology)  I have updated your Care Teams any recent Medical Services you may have received from other providers in the past year.     Assessment:   This is a routine wellness examination for Marie Hebert.  Hearing/Vision screen Hearing Screening - Comments:: Denies hearing difficulties.  Vision Screening - Comments:: Up to date with routine eye exams with Dr Beverlee   Goals Addressed   None    Depression Screen     04/21/2024   10:35 AM 02/14/2024   11:06 AM 07/30/2023    1:12 PM 06/05/2023   10:05 AM 02/13/2023    2:07 PM 10/29/2022   10:10 AM 04/26/2022   10:42 AM  PHQ 2/9 Scores  PHQ - 2 Score 0 1 0 0 0 0 0  PHQ- 9 Score 1 2         Fall Risk     04/21/2024   10:31 AM 02/14/2024   11:06 AM 07/30/2023    1:12 PM 06/05/2023   10:05 AM 02/13/2023    2:07 PM  Fall Risk   Falls in the past year? 0 0 0 0 0  Number falls in past yr: 0 0 0 0 0  Injury with Fall? 0 0 0 0 0  Follow up Education provided Falls evaluation completed;Education provided Falls evaluation completed;Education provided Falls evaluation completed Falls evaluation completed    MEDICARE RISK AT HOME:   Medicare Risk at Home Any stairs in or around the home?: No If so, are there any without handrails?: No Home free of loose throw rugs in walkways, pet beds, electrical cords, etc?: No Adequate lighting in your home to reduce risk of falls?: No Life alert?: No Use of a cane, walker or w/c?: No (has cane but not needed) Grab bars in the bathroom?: Yes Shower chair or bench in shower?: No Elevated toilet seat or a handicapped toilet?: No  TIMED UP AND GO:  Was the test performed?  No,audio  Cognitive Function: 6CIT completed        04/21/2024   10:40 AM  6CIT Screen  What Year? 0 points  What month? 0 points  What time? 0 points  Count back from 20 0 points  Months in reverse 0 points  Repeat phrase 0 points  Total Score 0 points    Immunizations Immunization History  Administered Date(s) Administered   Influenza Whole 03/24/2009   Td 02/13/2008    Screening Tests Health Maintenance  Topic Date Due   Influenza Vaccine  09/22/2024 (Originally 01/24/2024)   Medicare Annual Wellness (AWV)  07/24/2024   DEXA SCAN  Completed   Meningococcal B Vaccine  Aged Out   DTaP/Tdap/Td  Discontinued   Pneumococcal Vaccine: 50+ Years  Discontinued   COVID-19 Vaccine  Discontinued   Zoster Vaccines- Shingrix  Discontinued    Health Maintenance Items Addressed: Declines Flu vaccine or any further screening tests.  Additional Screening:  Vision Screening: Recommended annual ophthalmology exams for early detection of glaucoma and other disorders of the eye. Is the patient up to date with their annual eye exam?  Yes  Who is the provider or what is the name of the office in which the patient attends annual eye exams? Dr Radionchenko  Dental Screening: Recommended annual dental exams for proper oral hygiene  Community Resource Referral / Chronic Care Management: CRR required this visit?  No   CCM required this visit?  No   Plan:  I have personally reviewed and noted the  following in the patient's chart:   Medical and social history Use of alcohol, tobacco or illicit drugs  Current medications and supplements including opioid prescriptions. Patient is not currently taking opioid prescriptions. Functional ability and status Nutritional status Physical activity Advanced directives List of other physicians Hospitalizations, surgeries, and ER visits in previous 12 months Vitals Screenings to include cognitive, depression, and falls Referrals and appointments  In addition, I have reviewed and discussed with patient certain preventive protocols, quality metrics, and best practice recommendations. A written personalized care plan for preventive services as well as general preventive health recommendations were provided to patient.   Lolita Libra, CMA   04/21/2024   After Visit Summary: (MyChart) Due to this being a telephonic visit, the after visit summary with patients personalized plan was offered to patient via MyChart   Notes: Nothing significant to report at this time.

## 2024-04-21 NOTE — Patient Instructions (Addendum)
 Marie Hebert , Thank you for taking time out of your busy schedule to complete your Annual Wellness Visit with me. I enjoyed our conversation and look forward to speaking with you again next year. I, as well as your care team,  appreciate your ongoing commitment to your health goals. Please review the following plan we discussed and let me know if I can assist you in the future. Your Game plan/ To Do List     Follow up Visits: Next Medicare AWV with our clinical staff:  04/22/25 10:20am, telephone  Next Office Visit with your provider: 02/16/25 10am, Dr Amon  Clinician Recommendations:  Aim for 30 minutes of exercise or brisk walking, 6-8 glasses of water, and 5 servings of fruits and vegetables each day.       This is a list of the screening recommended for you and due dates:  Health Maintenance  Topic Date Due   Flu Shot  09/22/2024*   Medicare Annual Wellness Visit  07/24/2024   DEXA scan (bone density measurement)  Completed   Meningitis B Vaccine  Aged Out   DTaP/Tdap/Td vaccine  Discontinued   Pneumococcal Vaccine for age over 22  Discontinued   COVID-19 Vaccine  Discontinued   Zoster (Shingles) Vaccine  Discontinued  *Topic was postponed. The date shown is not the original due date.    Advanced directives: (ACP Link)Information on Advanced Care Planning can be found at [place]  Secretary of [facility] Advance Health Care Directives Advance Health Care Directives. [email or web address]  Advance Care Planning is important because it:  [x]  Makes sure you receive the medical care that is consistent with your values, goals, and preferences  [x]  It provides guidance to your family and loved ones and reduces their decisional burden about whether or not they are making the right decisions based on your wishes.  Follow the link provided in your after visit summary or read over the paperwork we have mailed to you to help you started getting your Advance Directives in place. If you need assistance  in completing these, please reach out to us  so that we can help you!  See attachments for Preventive Care and Fall Prevention Tips.

## 2025-02-16 ENCOUNTER — Encounter: Admitting: Internal Medicine

## 2025-04-22 ENCOUNTER — Ambulatory Visit
